# Patient Record
Sex: Female | Born: 1935 | Race: White | Hispanic: No | Smoking: Never smoker
Health system: Southern US, Community
[De-identification: ages and names within clinical notes are randomized; demographics above are authoritative.]

## PROBLEM LIST (undated history)

## (undated) DIAGNOSIS — I639 Cerebral infarction, unspecified: Secondary | ICD-10-CM

## (undated) DIAGNOSIS — M1990 Unspecified osteoarthritis, unspecified site: Secondary | ICD-10-CM

## (undated) DIAGNOSIS — R011 Cardiac murmur, unspecified: Secondary | ICD-10-CM

## (undated) DIAGNOSIS — I499 Cardiac arrhythmia, unspecified: Secondary | ICD-10-CM

## (undated) DIAGNOSIS — R112 Nausea with vomiting, unspecified: Secondary | ICD-10-CM

## (undated) DIAGNOSIS — I10 Essential (primary) hypertension: Secondary | ICD-10-CM

## (undated) DIAGNOSIS — R0600 Dyspnea, unspecified: Secondary | ICD-10-CM

## (undated) DIAGNOSIS — I2510 Atherosclerotic heart disease of native coronary artery without angina pectoris: Secondary | ICD-10-CM

## (undated) DIAGNOSIS — E7800 Pure hypercholesterolemia, unspecified: Secondary | ICD-10-CM

## (undated) DIAGNOSIS — Z9889 Other specified postprocedural states: Secondary | ICD-10-CM

## (undated) DIAGNOSIS — F32A Depression, unspecified: Secondary | ICD-10-CM

## (undated) DIAGNOSIS — F329 Major depressive disorder, single episode, unspecified: Secondary | ICD-10-CM

## (undated) DIAGNOSIS — R5383 Other fatigue: Secondary | ICD-10-CM

## (undated) DIAGNOSIS — M519 Unspecified thoracic, thoracolumbar and lumbosacral intervertebral disc disorder: Secondary | ICD-10-CM

## (undated) DIAGNOSIS — T4145XA Adverse effect of unspecified anesthetic, initial encounter: Secondary | ICD-10-CM

## (undated) DIAGNOSIS — M316 Other giant cell arteritis: Secondary | ICD-10-CM

## (undated) DIAGNOSIS — Z90710 Acquired absence of both cervix and uterus: Secondary | ICD-10-CM

## (undated) DIAGNOSIS — M549 Dorsalgia, unspecified: Secondary | ICD-10-CM

## (undated) DIAGNOSIS — I4891 Unspecified atrial fibrillation: Secondary | ICD-10-CM

## (undated) DIAGNOSIS — I771 Stricture of artery: Secondary | ICD-10-CM

## (undated) DIAGNOSIS — G459 Transient cerebral ischemic attack, unspecified: Secondary | ICD-10-CM

## (undated) DIAGNOSIS — T8859XA Other complications of anesthesia, initial encounter: Secondary | ICD-10-CM

## (undated) DIAGNOSIS — C801 Malignant (primary) neoplasm, unspecified: Secondary | ICD-10-CM

## (undated) HISTORY — PX: CARDIAC VALVE REPLACEMENT: SHX585

## (undated) HISTORY — DX: Essential (primary) hypertension: I10

## (undated) HISTORY — DX: Dyspnea, unspecified: R06.00

## (undated) HISTORY — DX: Other fatigue: R53.83

## (undated) HISTORY — PX: ABDOMINAL HYSTERECTOMY: SHX81

## (undated) HISTORY — DX: Stricture of artery: I77.1

## (undated) HISTORY — DX: Other giant cell arteritis: M31.6

## (undated) HISTORY — DX: Dorsalgia, unspecified: M54.9

## (undated) HISTORY — DX: Pure hypercholesterolemia, unspecified: E78.00

## (undated) HISTORY — PX: MOHS SURGERY: SHX181

## (undated) HISTORY — DX: Unspecified atrial fibrillation: I48.91

## (undated) HISTORY — PX: APPENDECTOMY: SHX54

## (undated) HISTORY — DX: Acquired absence of both cervix and uterus: Z90.710

## (undated) HISTORY — PX: CORONARY ANGIOPLASTY: SHX604

## (undated) HISTORY — DX: Transient cerebral ischemic attack, unspecified: G45.9

## (undated) HISTORY — DX: Cerebral infarction, unspecified: I63.9

---

## 1999-04-04 ENCOUNTER — Encounter: Admission: RE | Admit: 1999-04-04 | Discharge: 1999-04-04 | Payer: Self-pay | Admitting: Internal Medicine

## 1999-04-04 ENCOUNTER — Encounter: Payer: Self-pay | Admitting: Internal Medicine

## 1999-04-16 ENCOUNTER — Encounter: Admission: RE | Admit: 1999-04-16 | Discharge: 1999-04-16 | Payer: Self-pay | Admitting: Internal Medicine

## 1999-04-16 ENCOUNTER — Encounter: Payer: Self-pay | Admitting: Internal Medicine

## 2000-04-16 ENCOUNTER — Encounter: Payer: Self-pay | Admitting: Internal Medicine

## 2000-04-16 ENCOUNTER — Encounter: Admission: RE | Admit: 2000-04-16 | Discharge: 2000-04-16 | Payer: Self-pay | Admitting: Internal Medicine

## 2001-04-28 ENCOUNTER — Encounter: Payer: Self-pay | Admitting: Internal Medicine

## 2001-04-28 ENCOUNTER — Encounter: Admission: RE | Admit: 2001-04-28 | Discharge: 2001-04-28 | Payer: Self-pay | Admitting: Internal Medicine

## 2002-05-03 ENCOUNTER — Encounter: Admission: RE | Admit: 2002-05-03 | Discharge: 2002-05-03 | Payer: Self-pay | Admitting: Internal Medicine

## 2002-05-03 ENCOUNTER — Encounter: Payer: Self-pay | Admitting: Internal Medicine

## 2003-05-10 ENCOUNTER — Encounter: Admission: RE | Admit: 2003-05-10 | Discharge: 2003-05-10 | Payer: Self-pay | Admitting: Internal Medicine

## 2003-09-09 HISTORY — PX: PROLAPSED UTERINE FIBROID LIGATION: SHX5400

## 2004-05-09 HISTORY — PX: KNEE SURGERY: SHX244

## 2004-07-24 ENCOUNTER — Encounter: Admission: RE | Admit: 2004-07-24 | Discharge: 2004-07-24 | Payer: Self-pay | Admitting: Internal Medicine

## 2005-07-30 ENCOUNTER — Encounter: Admission: RE | Admit: 2005-07-30 | Discharge: 2005-07-30 | Payer: Self-pay | Admitting: Internal Medicine

## 2005-08-02 ENCOUNTER — Encounter: Admission: RE | Admit: 2005-08-02 | Discharge: 2005-08-02 | Payer: Self-pay | Admitting: Internal Medicine

## 2006-05-28 ENCOUNTER — Ambulatory Visit: Payer: Self-pay | Admitting: Cardiovascular Disease

## 2006-08-12 ENCOUNTER — Encounter: Admission: RE | Admit: 2006-08-12 | Discharge: 2006-08-12 | Payer: Self-pay | Admitting: Internal Medicine

## 2007-05-13 ENCOUNTER — Ambulatory Visit: Payer: Self-pay | Admitting: Vascular Surgery

## 2007-05-27 ENCOUNTER — Ambulatory Visit: Payer: Self-pay | Admitting: Vascular Surgery

## 2007-05-27 ENCOUNTER — Encounter: Admission: RE | Admit: 2007-05-27 | Discharge: 2007-05-27 | Payer: Self-pay | Admitting: Vascular Surgery

## 2007-07-01 ENCOUNTER — Ambulatory Visit: Payer: Self-pay | Admitting: Vascular Surgery

## 2007-07-10 ENCOUNTER — Ambulatory Visit (HOSPITAL_COMMUNITY): Admission: RE | Admit: 2007-07-10 | Discharge: 2007-07-10 | Payer: Self-pay | Admitting: Vascular Surgery

## 2007-07-10 ENCOUNTER — Ambulatory Visit: Payer: Self-pay | Admitting: Vascular Surgery

## 2007-08-19 ENCOUNTER — Ambulatory Visit: Payer: Self-pay | Admitting: Vascular Surgery

## 2007-08-19 ENCOUNTER — Encounter: Admission: RE | Admit: 2007-08-19 | Discharge: 2007-08-19 | Payer: Self-pay | Admitting: Internal Medicine

## 2008-01-26 ENCOUNTER — Ambulatory Visit: Payer: Self-pay | Admitting: Cardiovascular Disease

## 2008-02-08 ENCOUNTER — Ambulatory Visit: Payer: Self-pay

## 2008-02-24 ENCOUNTER — Ambulatory Visit (HOSPITAL_COMMUNITY): Admission: RE | Admit: 2008-02-24 | Discharge: 2008-02-24 | Payer: Self-pay | Admitting: Cardiovascular Disease

## 2008-05-25 DIAGNOSIS — E7800 Pure hypercholesterolemia, unspecified: Secondary | ICD-10-CM | POA: Insufficient documentation

## 2008-05-25 DIAGNOSIS — I6350 Cerebral infarction due to unspecified occlusion or stenosis of unspecified cerebral artery: Secondary | ICD-10-CM | POA: Insufficient documentation

## 2008-05-25 DIAGNOSIS — G459 Transient cerebral ischemic attack, unspecified: Secondary | ICD-10-CM | POA: Insufficient documentation

## 2008-05-25 DIAGNOSIS — I4891 Unspecified atrial fibrillation: Secondary | ICD-10-CM

## 2008-05-25 DIAGNOSIS — M316 Other giant cell arteritis: Secondary | ICD-10-CM

## 2008-05-25 DIAGNOSIS — R0602 Shortness of breath: Secondary | ICD-10-CM

## 2008-05-25 DIAGNOSIS — R5383 Other fatigue: Secondary | ICD-10-CM

## 2008-05-25 DIAGNOSIS — R002 Palpitations: Secondary | ICD-10-CM | POA: Insufficient documentation

## 2008-05-25 DIAGNOSIS — I10 Essential (primary) hypertension: Secondary | ICD-10-CM | POA: Insufficient documentation

## 2008-05-25 DIAGNOSIS — R5381 Other malaise: Secondary | ICD-10-CM | POA: Insufficient documentation

## 2008-05-26 ENCOUNTER — Ambulatory Visit: Payer: Self-pay | Admitting: Cardiovascular Disease

## 2008-05-26 ENCOUNTER — Encounter: Payer: Self-pay | Admitting: Cardiovascular Disease

## 2008-08-19 ENCOUNTER — Encounter: Admission: RE | Admit: 2008-08-19 | Discharge: 2008-08-19 | Payer: Self-pay | Admitting: *Deleted

## 2008-11-23 ENCOUNTER — Ambulatory Visit: Payer: Self-pay | Admitting: Cardiovascular Disease

## 2008-11-23 DIAGNOSIS — I776 Arteritis, unspecified: Secondary | ICD-10-CM

## 2008-11-25 LAB — CONVERTED CEMR LAB
CO2: 29 meq/L (ref 19–32)
Chloride: 107 meq/L (ref 96–112)
GFR calc non Af Amer: 64.43 mL/min (ref >60)
Glucose, Bld: 119 mg/dL — ABNORMAL HIGH (ref 70–99)

## 2008-11-30 ENCOUNTER — Ambulatory Visit: Payer: Self-pay | Admitting: Internal Medicine

## 2008-12-12 ENCOUNTER — Telehealth: Payer: Self-pay | Admitting: Cardiovascular Disease

## 2008-12-27 ENCOUNTER — Telehealth: Payer: Self-pay | Admitting: Cardiovascular Disease

## 2008-12-30 ENCOUNTER — Telehealth (INDEPENDENT_AMBULATORY_CARE_PROVIDER_SITE_OTHER): Payer: Self-pay | Admitting: *Deleted

## 2009-01-27 ENCOUNTER — Telehealth (INDEPENDENT_AMBULATORY_CARE_PROVIDER_SITE_OTHER): Payer: Self-pay | Admitting: *Deleted

## 2009-04-05 ENCOUNTER — Encounter (INDEPENDENT_AMBULATORY_CARE_PROVIDER_SITE_OTHER): Payer: Self-pay | Admitting: *Deleted

## 2009-05-17 ENCOUNTER — Ambulatory Visit: Payer: Self-pay | Admitting: Cardiovascular Disease

## 2009-05-17 DIAGNOSIS — R011 Cardiac murmur, unspecified: Secondary | ICD-10-CM | POA: Insufficient documentation

## 2009-06-06 ENCOUNTER — Encounter: Payer: Self-pay | Admitting: Cardiovascular Disease

## 2009-06-06 ENCOUNTER — Ambulatory Visit: Payer: Self-pay

## 2009-06-06 ENCOUNTER — Ambulatory Visit: Payer: Self-pay | Admitting: Cardiology

## 2009-06-06 ENCOUNTER — Ambulatory Visit (HOSPITAL_COMMUNITY): Admission: RE | Admit: 2009-06-06 | Discharge: 2009-06-06 | Payer: Self-pay | Admitting: Cardiovascular Disease

## 2009-08-21 ENCOUNTER — Encounter: Admission: RE | Admit: 2009-08-21 | Discharge: 2009-08-21 | Payer: Self-pay | Admitting: Internal Medicine

## 2010-02-21 ENCOUNTER — Telehealth (INDEPENDENT_AMBULATORY_CARE_PROVIDER_SITE_OTHER): Payer: Self-pay | Admitting: *Deleted

## 2010-04-01 ENCOUNTER — Encounter: Payer: Self-pay | Admitting: Internal Medicine

## 2010-04-10 ENCOUNTER — Ambulatory Visit
Admission: RE | Admit: 2010-04-10 | Discharge: 2010-04-10 | Payer: Self-pay | Source: Home / Self Care | Attending: Cardiovascular Disease | Admitting: Cardiovascular Disease

## 2010-04-10 NOTE — Letter (Signed)
Summary: Appointment - Reminder 2  [employer], Main Office  [address]   [place], [place] [address]   Phone: [phone]  Fax: [phone]     April 05, 2009 MRN: [id number]   [name] [name] [address] [place], [place]  [address]   Dear Ms. SHULTS,  Our records indicate that it is time to schedule a follow-up appointment with Dr. Eden Emms. It is very important that we reach you to schedule this appointment. We look forward to participating in your health care needs. Please contact us at the number listed above at your earliest convenience to schedule your appointment.  If you are unable to make an appointment at this time, give [place] a call so we can update our records.  Sincerely,   [facility] Scheduling Team

## 2010-04-10 NOTE — Assessment & Plan Note (Signed)
Summary: 6 month rov/sl   CC:  check up.  History of Present Illness: [name] [name] is seen today for F/U of bruits, vasculitis, with subclavian stenosis on the right, elevated chol. , HTN and PAF.  She is on chronic coumdin which is followed by Dr. Mikey Bussing.  Her levels have been up and down due to fruit consumption.  She sees Dr's in [place] for her giant cell arteritis and is now on methotrexate and low dose prednisone.  Dr. Darrick Penna has turned her down for surgery.  She has chronic rigth arm weakness.  She has had bruits in the neck but no carotid stenosis by duplex most recently 2009.  She has not had any symptoms of TIA, SSCP palpitations, or syncope. I reviewed her CTA from 9/10.  I think it is stable.  She clearly has subclavian stenosis on the right with stable symptoms but has 4 vessel run-off to the head without critical carotid stenosis.    Current Problems (verified): 1)  Murmur  (ICD-785.2) 2)  Vasculitis  (ICD-447.6) 3)  Atrial Fibrillation  (ICD-427.31) 4)  Tia  (ICD-435.9) 5)  Cva  (ICD-434.91) 6)  Hypertension  (ICD-401.9) 7)  Hypercholesterolemia  (ICD-272.0) 8)  Fatigue  (ICD-780.79) 9)  Dyspnea  (ICD-786.05) 10)  Palpitations  (ICD-785.1) 11)  Giant Cell Arteritis  (ICD-446.5)  Current Medications (verified): 1)  Simvastatin 40 Mg Tabs (Simvastatin) .... 1/2 Tab By Mouth Once Daily 2)  Warfarin Sodium 5 Mg Tabs (Warfarin Sodium) .... As Directed By Clinic 3)  Zolpidem Tartrate 10 Mg Tabs (Zolpidem Tartrate) .Marland Kitchen.. 1 Daily 4)  Lopressor 100 Mg Tabs (Metoprolol Tartrate) .Marland Kitchen.. 1 Tab By Mouth Bid 5)  Methotrexate 2.5 Mg Tabs (Methotrexate Sodium) .... 4  Tabs By Mouth Weekly 6)  Enalapril Maleate 20 Mg Tabs (Enalapril Maleate) .... Take One Tablet By Mouth Twice A Day 7)  Vitamin D (Ergocalciferol) 50000 Unit Caps (Ergocalciferol) .... Weekly 8)  Gabapentin 300 Mg Caps (Gabapentin) .Marland Kitchen.. 1 Tab By Mouth Two Times A Day 9)  Hydrocodone-Acetaminophen 5-500 Mg Tabs  (Hydrocodone-Acetaminophen) .... As Needed  Allergies (verified): No Known Drug Allergies  Past History:  Past Medical History: Last updated: 11/23/2008 Current Problems:  ATRIAL FIBRILLATION (ICD-427.31) TIA (ICD-435.9) CVA (ICD-434.91) HYPERTENSION (ICD-401.9) HYPERCHOLESTEROLEMIA (ICD-272.0)secondary to vascular disease. FATIGUE (ICD-780.79) DYSPNEA (ICD-786.05) PALPITATIONS (ICD-785.1) GIANT CELL ARTERITIS (ICD-446.5)  Recent urinary tract infection.  chronic back pain  Past Surgical History: Last updated: 05/25/2008  uterine prolapse surgery in July 2005 left knee surgery in March 2006 hysterectomy appendectomy  Family History: Last updated: 05/25/2008 noncontributory.  Social History: Last updated: 05/25/2008  She is happily married.  Her husband was with her today.  She looks   younger than her stated age. Tobacco Use - No.  Alcohol Use - no  Review of Systems       Denies fever, malais, weight loss, blurry vision, decreased visual acuity, cough, sputum, SOB, hemoptysis, pleuritic pain, palpitaitons, heartburn, abdominal pain, melena, lower extremity edema, claudication, or rash.   Vital Signs:  Patient profile:   77 year old female Height:      64 inches Weight:      142 pounds BMI:     24.46 Pulse rate:   66 / minute Resp:     12 per minute BP sitting:   115 / 70  (left arm)  Vitals Entered By: Kem Parkinson (May 17, 2009 3:12 PM)  Physical Exam  General:  Affect appropriate Healthy:  appears stated age HEENT: normal Neck supple  with no adenopathy JVP normal bilateral  bruits no thyromegaly Lungs clear with no wheezing and good diaphragmatic motion Heart:  S1/S2 systolic murmur, no rub, gallop or click PMI normal Abdomen: benighn, BS positve, no tenderness, no AAA no bruit.  No HSM or HJR Distal pulses intact with no bruits No edema Neuro non-focal Skin warm and dry Unable to palpate right brachial/radial or ulner Bilateral  carotid and sublavian bruity.  Should always take BP in left arm   Impression & Recommendations:  Problem # 1:  VASCULITIS (ICD-447.6) Burnt out giant cell arteritis with residual severe stenosis of right sublavian. 4 vessel run off to head with no carotid stenosis.  R arm symptoms moderate but stable.  Previously seen by surgeon and refused intervention  Problem # 2:  HYPERTENSION (ICD-401.9) Well controlled continue low soidum diet The following medications were removed from the medication list:    Hydrochlorothiazide 12.5 Mg Tabs (Hydrochlorothiazide) .Marland Kitchen... Take one tablet by mouth daily. Her updated medication list for this problem includes:    Lopressor 100 Mg Tabs (Metoprolol tartrate) .Marland Kitchen... 1 tab by mouth bid    Enalapril Maleate 20 Mg Tabs (Enalapril maleate) .Marland Kitchen... Take one tablet by mouth twice a day  Problem # 3:  MURMUR (ICD-785.2) Likely aortic sclerosis.  F/U echo The following medications were removed from the medication list:    Hydrochlorothiazide 12.5 Mg Tabs (Hydrochlorothiazide) .Marland Kitchen... Take one tablet by mouth daily. Her updated medication list for this problem includes:    Lopressor 100 Mg Tabs (Metoprolol tartrate) .Marland Kitchen... 1 tab by mouth bid    Enalapril Maleate 20 Mg Tabs (Enalapril maleate) .Marland Kitchen... Take one tablet by mouth twice a day  Orders: Echocardiogram (Echo)  Problem # 4:  CVA (ICD-434.91) Distant CVA with no carotid disease by duplex 2009 or CTA 2010  F/U INR [place] Her updated medication list for this problem includes:    Warfarin Sodium 5 Mg Tabs (Warfarin sodium) .Marland Kitchen... As directed by clinic  Patient Instructions: 1)  Your physician recommends that you schedule a follow-up appointment in: ONE YEAR 2)  Your physician has requested that you have an echocardiogram.  Echocardiography is a painless test that uses sound waves to create images of your heart. It provides your doctor with information about the size and shape of your heart and how well your  heart's chambers and valves are working.  This procedure takes approximately one hour. There are no restrictions for this procedure.  Prevention & Chronic Care Immunizations   Influenza vaccine: Not documented    Tetanus booster: Not documented    Pneumococcal vaccine: Not documented    H. zoster vaccine: Not documented  Colorectal Screening   Hemoccult: Not documented    Colonoscopy: Not documented  Other Screening   Pap smear: Not documented    Mammogram: Not documented    DXA bone density scan: Not documented   Smoking status: never  (05/25/2008)  Lipids   Total Cholesterol: Not documented   LDL: Not documented   LDL Direct: Not documented   HDL: Not documented   Triglycerides: Not documented    SGOT (AST): Not documented   SGPT (ALT): Not documented   Alkaline phosphatase: Not documented   Total bilirubin: Not documented  Hypertension   Last Blood Pressure: 115 / 70  (05/17/2009)   Serum creatinine: 0.9  (11/23/2008)   Serum potassium 4.1  (11/23/2008)  Self-Management Support :    Hypertension self-management support: Not documented    Lipid self-management support: Not documented  EKG Report  Procedure date:  05/17/2009  Findings:      NSR 66 Normal ECG

## 2010-04-12 NOTE — Progress Notes (Signed)
  EMSI request received sent to [facility] w/ chart Kimberly Mesiemore  February 21, 2010 1:59 PM   

## 2010-04-18 NOTE — Assessment & Plan Note (Signed)
Summary: F1Y/DM   CC:  pain in arms..pt states another MD told her he thinks its muscle pain.  History of Present Illness: [name] [name] is seen today for F/U of bruits, vasculitis, with subclavian stenosis on the right, elevated chol. , HTN and PAF.  She is on chronic coumdin which is followed by Dr. Mikey Bussing.  Her levels have been up and down due to fruit consumption.  She sees Dr's in [place] for her giant cell arteritis and is now off methotrexate and low dose prednisone.  Dr. Darrick Penna has turned her down for surgery.  She has chronic rigth arm weakness.  She has had bruits in the neck but no carotid stenosis by duplex most recently 2009.  She has not had any symptoms of TIA, SSCP palpitations, or syncope. I reviewed her CTA from 9/10.  I think it is stable.  She clearly has subclavian stenosis on the right with stable symptoms but has 4 vessel run-off to the head   She has had muscular arm pain that is worse at rest.  Seems to coincide with restarting statin.  Will have her take a holiday from it and see if pain improves.  Had lab work at [facility] yesterday will get to review.  Current Problems (verified): 1)  Murmur  (ICD-785.2) 2)  Vasculitis  (ICD-447.6) 3)  Atrial Fibrillation  (ICD-427.31) 4)  Tia  (ICD-435.9) 5)  Cva  (ICD-434.91) 6)  Hypertension  (ICD-401.9) 7)  Hypercholesterolemia  (ICD-272.0) 8)  Fatigue  (ICD-780.79) 9)  Dyspnea  (ICD-786.05) 10)  Palpitations  (ICD-785.1) 11)  Giant Cell Arteritis  (ICD-446.5)  Current Medications (verified): 1)  Warfarin Sodium 5 Mg Tabs (Warfarin Sodium) .... As Directed By Clinic 2)  Zolpidem Tartrate 10 Mg Tabs (Zolpidem Tartrate) .Marland Kitchen.. 1 Daily 3)  Lopressor 100 Mg Tabs (Metoprolol Tartrate) .Marland Kitchen.. 1 Tab By Mouth Bid 4)  Enalapril Maleate 20 Mg Tabs (Enalapril Maleate) .... Take One Tablet By Mouth Twice A Day 5)  Meloxicam 7.5 Mg Tabs (Meloxicam) .... As Needed  Allergies (verified): No Known Drug Allergies  Past History:  Past Medical  History: Last updated: 11/23/2008 Current Problems:  ATRIAL FIBRILLATION (ICD-427.31) TIA (ICD-435.9) CVA (ICD-434.91) HYPERTENSION (ICD-401.9) HYPERCHOLESTEROLEMIA (ICD-272.0)secondary to vascular disease. FATIGUE (ICD-780.79) DYSPNEA (ICD-786.05) PALPITATIONS (ICD-785.1) GIANT CELL ARTERITIS (ICD-446.5)  Recent urinary tract infection.  chronic back pain  Past Surgical History: Last updated: 05/25/2008  uterine prolapse surgery in July 2005 left knee surgery in March 2006 hysterectomy appendectomy  Family History: Last updated: 05/25/2008 noncontributory.  Social History: Last updated: 05/25/2008  She is happily married.  Her husband was with her today.  She looks   younger than her stated age. Tobacco Use - No.  Alcohol Use - no  Review of Systems       Denies fever, malais, weight loss, blurry vision, decreased visual acuity, cough, sputum, SOB, hemoptysis, pleuritic pain, palpitaitons, heartburn, abdominal pain, melena, lower extremity edema, claudication, or rash.   Vital Signs:  Patient profile:   77 year old female Height:      64 inches Weight:      142 pounds BMI:     24.46 Pulse rate:   68 / minute Resp:     14 per minute BP sitting:   118 / 68  (left arm) BP standing:   80 / 55  (right arm)  Vitals Entered By: Kem Parkinson (April 10, 2010 1:42 PM)  Physical Exam  General:  Affect appropriate Healthy:  appears stated age HEENT: normal Neck  supple with no adenopathy JVP normal bilateral carotid and right subclavian bruits no thyromegaly Lungs clear with no wheezing and good diaphragmatic motion Heart:  S1/S2 no murmur,rub, gallop or click PMI normal Abdomen: benighn, BS positve, no tenderness, no AAA no bruit.  No HSM or HJR Distal pulses intact with no bruits No edema Neuro non-focal Skin warm and dry    Impression & Recommendations:  Problem # 1:  HYPERTENSION (ICD-401.9) Well controlled.  Should take BP in left arm Her  updated medication list for this problem includes:    Lopressor 100 Mg Tabs (Metoprolol tartrate) .Marland Kitchen... 1 tab by mouth bid    Enalapril Maleate 20 Mg Tabs (Enalapril maleate) .Marland Kitchen... Take one tablet by mouth twice a day  Problem # 2:  HYPERCHOLESTEROLEMIA (ICD-272.0) Check labs from [place].  Holiday from statin and see if arm pain improves The following medications were removed from the medication list:    Simvastatin 40 Mg Tabs (Simvastatin) .Marland Kitchen... 1/2 tab by mouth once daily  Problem # 3:  GIANT CELL ARTERITIS (ICD-446.5) Residual vascular disease primarily manifest with right subclavian stenosis.  F/U UE and carotid duplex.  No indication for surgery.  Other Orders: Carotid Duplex (Carotid Duplex) Arterial Duplex Upper Extremity (Arterial Duplex Up )  Patient Instructions: 1)  Your physician wants you to follow-up in:6 MONTHS   You will receive a reminder letter in the mail two months in advance. If you don't receive a letter, please call our office to schedule the follow-up appointment. 2)  Your physician has requested that you have a carotid duplex. This test is an ultrasound of the carotid arteries in your neck. It looks at blood flow through these arteries that supply the brain with blood. Allow one hour for this exam. There are no restrictions or special instructions. 3)  Your physician has requested that you have a  upper extremity arterial duplex.  This test is an ultrasound of the arteries in the legs or arms.  It looks at arterial blood flow in the legs and arms.  Allow one hour for Lower and Upper Arterial scans. There are no restrictions or special instructions.HX SUBCLAVIN STEAL

## 2010-04-26 ENCOUNTER — Other Ambulatory Visit (INDEPENDENT_AMBULATORY_CARE_PROVIDER_SITE_OTHER): Payer: Medicare Other

## 2010-04-26 ENCOUNTER — Encounter (INDEPENDENT_AMBULATORY_CARE_PROVIDER_SITE_OTHER): Payer: Medicare Other

## 2010-04-26 ENCOUNTER — Encounter: Payer: Self-pay | Admitting: Cardiovascular Disease

## 2010-04-26 DIAGNOSIS — I6529 Occlusion and stenosis of unspecified carotid artery: Secondary | ICD-10-CM

## 2010-04-26 DIAGNOSIS — I70219 Atherosclerosis of native arteries of extremities with intermittent claudication, unspecified extremity: Secondary | ICD-10-CM

## 2010-05-21 ENCOUNTER — Emergency Department (HOSPITAL_COMMUNITY)
Admission: EM | Admit: 2010-05-21 | Discharge: 2010-05-22 | Disposition: A | Discharge status: Home / Self Care | Payer: Medicare Other | Attending: Emergency Medicine | Admitting: Emergency Medicine

## 2010-05-21 DIAGNOSIS — R5381 Other malaise: Secondary | ICD-10-CM | POA: Insufficient documentation

## 2010-05-21 DIAGNOSIS — E7800 Pure hypercholesterolemia, unspecified: Secondary | ICD-10-CM | POA: Insufficient documentation

## 2010-05-21 DIAGNOSIS — Z8679 Personal history of other diseases of the circulatory system: Secondary | ICD-10-CM | POA: Insufficient documentation

## 2010-05-21 DIAGNOSIS — I10 Essential (primary) hypertension: Secondary | ICD-10-CM | POA: Insufficient documentation

## 2010-05-21 DIAGNOSIS — I4891 Unspecified atrial fibrillation: Secondary | ICD-10-CM | POA: Insufficient documentation

## 2010-05-21 DIAGNOSIS — M79609 Pain in unspecified limb: Secondary | ICD-10-CM | POA: Insufficient documentation

## 2010-05-22 LAB — CBC
HCT: 41.3 % (ref 36.0–46.0)
MCHC: 32.7 g/dL (ref 30.0–36.0)
Platelets: 205 10*3/uL (ref 150–400)
RBC: 5.42 MIL/uL — ABNORMAL HIGH (ref 3.87–5.11)
RDW: 14.8 % (ref 11.5–15.5)
WBC: 11.7 10*3/uL — ABNORMAL HIGH (ref 4.0–10.5)

## 2010-05-22 LAB — DIFFERENTIAL
Basophils Relative: 0 % (ref 0–1)
Eosinophils Absolute: 0.1 10*3/uL (ref 0.0–0.7)
Lymphocytes Relative: 17 % (ref 12–46)
Monocytes Relative: 7 % (ref 3–12)
Neutro Abs: 8.8 10*3/uL — ABNORMAL HIGH (ref 1.7–7.7)
Neutrophils Relative %: 75 % (ref 43–77)

## 2010-05-22 LAB — BASIC METABOLIC PANEL
BUN: 13 mg/dL (ref 6–23)
Chloride: 103 mEq/L (ref 96–112)
Creatinine, Ser: 0.77 mg/dL (ref 0.4–1.2)
GFR calc Af Amer: >60 mL/min (ref >60)
GFR calc non Af Amer: >60 mL/min (ref >60)
Glucose, Bld: 104 mg/dL — ABNORMAL HIGH (ref 70–99)
Potassium: 4.1 mEq/L (ref 3.5–5.1)
Sodium: 138 mEq/L (ref 135–145)

## 2010-05-22 LAB — POCT CARDIAC MARKERS
CKMB, poc: 2.4 ng/mL (ref 1.0–8.0)
Troponin i, poc: <0.05 ng/mL (ref 0.00–0.09)

## 2010-05-22 LAB — PROTIME-INR: Prothrombin Time: 28.2 seconds — ABNORMAL HIGH (ref 11.6–15.2)

## 2010-05-30 ENCOUNTER — Telehealth: Payer: Self-pay | Admitting: Cardiovascular Disease

## 2010-05-30 NOTE — Telephone Encounter (Signed)
Returned patient's call-states she had been having weak spells so she went to her PCP Dr. Mikey Bussing in [facility] yesterday.  In atrial fibrillation per patient.  To see PCP again next week.  Currently on Coumadin for a. Fib already.  States she will see Dr. Eden Emms if needs to after next Wed. Appt. With PCP.

## 2010-05-30 NOTE — Telephone Encounter (Signed)
Pt states she been weak. Pt states she went to her primary doctor and the doctor said she was in a fib. Pt want to know what she needs to do.

## 2010-06-28 ENCOUNTER — Encounter: Payer: Self-pay | Admitting: Physician Assistant

## 2010-06-29 ENCOUNTER — Encounter: Payer: Self-pay | Admitting: Physician Assistant

## 2010-06-29 ENCOUNTER — Ambulatory Visit (INDEPENDENT_AMBULATORY_CARE_PROVIDER_SITE_OTHER): Payer: Medicare Other | Admitting: Physician Assistant

## 2010-06-29 VITALS — BP 118/78 | HR 102 | Ht 61.0 in | Wt 137.0 lb

## 2010-06-29 DIAGNOSIS — I4891 Unspecified atrial fibrillation: Secondary | ICD-10-CM

## 2010-06-29 DIAGNOSIS — R079 Chest pain, unspecified: Secondary | ICD-10-CM | POA: Insufficient documentation

## 2010-06-29 MED ORDER — DIGOXIN 125 MCG PO TABS: 125.0000 ug | ORAL_TABLET | Freq: Every day | ORAL | Status: DC

## 2010-06-29 NOTE — Assessment & Plan Note (Signed)
Her symptoms are somewhat atypical.  It may be related to atrial fibrillation.  She will undergo Lexiscan Myoview study.

## 2010-06-29 NOTE — Assessment & Plan Note (Addendum)
She is back in atrial fibrillation.  She remains on Coumadin.  She is somewhat symptomatic with this.  Her heart rate is uncontrolled.  She did not tolerate the higher dose of Toprol.  I do not believe that she would tolerate the lowest dose of Cardizem either given her blood pressure and her response to the Toprol.  Therefore, I decided to place her on digoxin 0.125 mg a day.  She's had normal renal function in the past.  I will set her up for follow up with Dr. Eden Emms.  We will obtain her prior INRs from her PCP.  She has had some chest discomfort.  I will arrange a stress test as well.  She may be a candidate for antiarrhythmic therapy if we cannot get her back into rhythm on her own.  We can certainly consider proceeding with cardioversion if she does not convert back to sinus rhythm on her own.    07/02/10: Received notification from her PCPs office regarding recent INRs as follows:  04/09/10...3.9  05/10/10...3.2  06/20/10....5.5  06/26/10....3.2

## 2010-06-29 NOTE — Progress Notes (Signed)
History of Present Illness: Primary Cardiologist:  Dr. Charlton Haws  [name] [name] is a 77 y.o. female with a h/o parox. AFib and right subclavian stenosis.  She is referred back by her PCP.  She has had recurrent atrial flutter fibrillation over the last 4 weeks.  By her description, she is having her axes normal atrial fibrillation.  She sometimes feels her heart rate is elevated.  She sometimes feels short of breath.  She does have a chest fullness.  This seems to come on with exertion.  She denies any radiation or associated nausea or diaphoresis.  She denies syncope.  She denies orthopnea or PND.  Denies pedal edema.  Her PCP recently increased her Toprol to 100 mg 3 times a day.  She was unable tolerate this and the dose was changed back to twice a day.  Her Coumadin is managed by her PCP.  It sounds as though it has been therapeutic over the last several weeks.  Past Medical History  Diagnosis Date  . Atrial fibrillation     coumadin per PCP;  echo 06/06/09: EF 60%, mild LVH, Gr 1 diast dysfx, AV gradient mean 9, mod MR, mod LAE;  Myoview 11/09: normal, EF 81%  . TIA (transient ischemic attack)   . CVA (cerebral vascular accident)   . HTN (hypertension)   . Hypercholesteremia   . Fatigue   . Dyspnea   . Palpitations   . Giant cell arteritis     sees dr. at [facility]  . UTI (urinary tract infection)   . Back pain   . H/O: hysterectomy   . Subclavian artery stenosis, right     dopplers done 2/12;  carotids 2/12: 0-39% bilateral    Current Outpatient Prescriptions  Medication Sig Dispense Refill  . ALPRAZolam (XANAX) 0.5 MG tablet Take 0.5 mg by mouth. 1/2 to 1 tablet as needed for anxiety       . enalapril (VASOTEC) 20 MG tablet Take 20 mg by mouth 2 (two) times daily.       Marland Kitchen HYDROcodone-acetaminophen (VICODIN) 5-500 MG per tablet Take 1 tablet by mouth every 6 (six) hours as needed.        . meloxicam (MOBIC) 7.5 MG tablet Take 7.5 mg by mouth daily.        . metoprolol (TOPROL-XL)  200 MG 24 hr tablet Take 100 mg by mouth 2 (two) times daily.        . rosuvastatin (CRESTOR) 10 MG tablet Take 5 mg by mouth daily.        Marland Kitchen warfarin (COUMADIN) 5 MG tablet Take 5 mg by mouth as directed.        . zolpidem (AMBIEN) 10 MG tablet Take 10 mg by mouth at bedtime as needed.        Marland Kitchen DISCONTD: metoprolol (LOPRESSOR) 100 MG tablet Take 100 mg by mouth 2 (two) times daily.          No Known Allergies  ROS:  See history of present illness.  She denies fevers, chills, cough, melena, hematochezia.  Other systems reviewed and negative.  Of note, she did go the emergency room recently with recurrent arm pain.  She had Dopplers performed in February 2012 that demonstrated continued stenosis on the right brachial consistent with steal.  Vital Signs: BP 118/78  Pulse 102  Ht 5\' 1"  (1.549 m)  Wt 137 lb (62.143 kg)  BMI 25.89 kg/m2  PHYSICAL EXAM: Well nourished, well developed, in no acute distress  HEENT: normal Neck: no JVD Endocrine: no thyromegaly Cardiac:  normal S1, S2; irreg. Irreg. Rhythm, no murmur Lungs:  clear to auscultation bilaterally, no wheezing, rhonchi or rales Abd: soft, nontender, no hepatomegaly Ext: no edema Skin: warm and dry Neuro:  CNs 2-12 intact, no focal abnormalities noted Psych: normal affect Repeat BP by me in left arm: 110/60  EKG:  Atrial fibrillation, heart rate 102, normal axis, nonspecific ST-T wave changes  ASSESSMENT AND PLAN:

## 2010-06-29 NOTE — Patient Instructions (Signed)
Your physician recommends that you schedule a follow-up appointment in: 2 WEEKS WITH DR. Eden Emms AS PER SCOTT WEAVER, PA-C.  Your physician has requested that you have a lexiscan Myoview 786.50 WITHIN THE NEXT 1-2 WEEKS BEFORE SEEING NISHAN. For further information please visit [email or web address] Please follow instruction sheet, as given.  Your physician has recommended you make the following change in your medication: DIGOXIN 0.125 MG 1 TABLET DAILY

## 2010-07-10 ENCOUNTER — Encounter: Payer: Self-pay | Admitting: Cardiovascular Disease

## 2010-07-10 ENCOUNTER — Ambulatory Visit (HOSPITAL_COMMUNITY): Payer: Medicare Other | Attending: Cardiovascular Disease | Admitting: Radiology

## 2010-07-10 ENCOUNTER — Encounter: Payer: Self-pay | Admitting: *Deleted

## 2010-07-10 ENCOUNTER — Ambulatory Visit: Payer: Medicare Other | Admitting: Cardiovascular Disease

## 2010-07-10 ENCOUNTER — Ambulatory Visit (INDEPENDENT_AMBULATORY_CARE_PROVIDER_SITE_OTHER): Payer: Medicare Other | Admitting: Cardiovascular Disease

## 2010-07-10 VITALS — BP 96/62 | HR 81 | Ht 63.0 in | Wt 133.0 lb

## 2010-07-10 DIAGNOSIS — I4891 Unspecified atrial fibrillation: Secondary | ICD-10-CM

## 2010-07-10 DIAGNOSIS — I10 Essential (primary) hypertension: Secondary | ICD-10-CM

## 2010-07-10 DIAGNOSIS — R9431 Abnormal electrocardiogram [ECG] [EKG]: Secondary | ICD-10-CM

## 2010-07-10 DIAGNOSIS — I776 Arteritis, unspecified: Secondary | ICD-10-CM

## 2010-07-10 DIAGNOSIS — Z01810 Encounter for preprocedural cardiovascular examination: Secondary | ICD-10-CM

## 2010-07-10 DIAGNOSIS — R079 Chest pain, unspecified: Secondary | ICD-10-CM

## 2010-07-10 DIAGNOSIS — R0602 Shortness of breath: Secondary | ICD-10-CM

## 2010-07-10 DIAGNOSIS — Z7901 Long term (current) use of anticoagulants: Secondary | ICD-10-CM

## 2010-07-10 DIAGNOSIS — E7800 Pure hypercholesterolemia, unspecified: Secondary | ICD-10-CM

## 2010-07-10 MED ORDER — REGADENOSON 0.4 MG/5ML IV SOLN
0.4000 mg | Freq: Once | INTRAVENOUS | Status: AC
  Administered 2010-07-10: 0.4 mg via INTRAVENOUS

## 2010-07-10 MED ORDER — TECHNETIUM TC 99M TETROFOSMIN IV KIT
10.6000 | PACK | Freq: Once | INTRAVENOUS | Status: AC | PRN
  Administered 2010-07-10: 11 via INTRAVENOUS

## 2010-07-10 MED ORDER — TECHNETIUM TC 99M TETROFOSMIN IV KIT
33.0000 | PACK | Freq: Once | INTRAVENOUS | Status: AC | PRN
  Administered 2010-07-10: 33 via INTRAVENOUS

## 2010-07-10 NOTE — Progress Notes (Signed)
[facility] SITE 3 NUCLEAR MED [address] [place] [address] [phone]  Cardiology Nuclear Med Study  [name] [name] is a 77 y.o. female [id number] [birth year]   Nuclear Med Background Indication for Stress Test:  Evaluation for Ischemia and recent OV for Atrial fibrillation History: 03/11 Echo 60%, '05 Heart Catheterization No CAD, 11/09 Myocardial Perfusion Study EF 81% NL and AFIB/AFLUTTER Cardiac Risk Factors: Carotid Disease, CVA, Hypertension, Lipids, PVD and TIA  Symptoms:  Chest Pain, DOE, Fatigue, Fatigue with Exertion and Palpitations   Nuclear Pre-Procedure Caffeine/Decaff Intake:  None NPO After: 7:30am   Lungs:  clear IV 0.9% NS with Angio Cath:  20g  IV Site: R Hand  IV Started by:  Cathlyn Parsons, RN  Chest Size (in):  36 Cup Size: B  Height: 5\' 3"  (1.6 m)  Weight:  133 lb (60.328 kg)  BMI:  Body mass index is 23.56 kg/(m^2). Tech Comments: Metoprolol held x  4 hrs.    Nuclear Med Study 1 or 2 day study: 1 day  Stress Test Type:  Eugenie Birks  Reading MD: Arvilla Meres, MD  Order Authorizing Provider: P.Nishan  Resting Radionuclide: Technetium 18m Tetrofosmin  Resting Radionuclide Dose: 10.6 mCi   Stress Radionuclide:  Technetium 24m Tetrofosmin  Stress Radionuclide Dose: 33 mCi           Stress Protocol Rest HR: 66 Rest HR: 81  Rest BP: 112/74 Stress BP: 132/62  Exercise Time (min): n/a METS: n/a   Predicted Max HR: 141 bpm % Max HR: 57.45 bpm Rate Pressure Product: 21308   Dose of Adenosine (mg):  n/a Dose of Lexiscan: 0.4 mg  Dose of Atropine (mg): n/a Dose of Dobutamine: n/a mcg/kg/min (at max HR)  Stress Test Technologist: Milana Na, EMT-P  Nuclear Technologist:  Domenic Polite, CNMT     Rest Procedure:  Myocardial perfusion imaging was performed at rest 45 minutes following the intravenous administration of Technetium 42m Tetrofosmin. Rest ECG: AFIB/AFLUTTER  Stress Procedure:  The patient received IV  Lexiscan 0.4 mg over 15-seconds.  Technetium 45m Tetrofosmin injected at 30-seconds.  There were no significant changes with Lexiscan.  Quantitative spect images were obtained after a 45 minute delay. Stress ECG: No significant change from baseline ECG  QPS Raw Data Images:  Normal; no motion artifact; normal heart/lung ratio. Stress Images:  Normal homogeneous uptake in all areas of the myocardium. Rest Images:  Normal homogeneous uptake in all areas of the myocardium. Subtraction (SDS):  Normal Transient Ischemic Dilatation (Normal <1.22):  1.07 Lung/Heart Ratio (Normal <0.45):  0.32  Quantitative Gated Spect Images QGS EDV:  45 ml QGS ESV:  13 ml QGS cine images:  NL LV Function; NL Wall Motion QGS EF: 72%  Impression Exercise Capacity:  Lexiscan with no exercise. BP Response:  n/a Clinical Symptoms:  n/a ECG Impression:  Atrial fib with rest and stress. No significant ST abnormalities with stress. Comparison with Prior Nuclear Study: No significant change from previous study  Overall Impression:  Normal stress nuclear study.    Angel Hobdy

## 2010-07-10 NOTE — Patient Instructions (Addendum)
Your physician recommends that you schedule a follow-up appointment in: WILL SCHEDULE HOSP F/U FROM HOSP  Your physician recommends that you return for lab work in: TODAY BMET CBC PT V72.81  Your physician has recommended that you have a Cardioversion (DCCV). Electrical Cardioversion uses a jolt of electricity to your heart either through paddles or wired patches attached to your chest. This is a controlled, usually prescheduled, procedure. Defibrillation is done under light anesthesia in the hospital, and you usually go home the day of the procedure. This is done to get your heart back into a normal rhythm. You are not awake for the procedure. Please see the instruction sheet given to you today. 07/12/10   Your physician recommends that you continue on your current medications as directed. Please refer to the Current Medication list given to you today.

## 2010-07-10 NOTE — Assessment & Plan Note (Signed)
Cholesterol is at goal.  Continue current dose of statin and diet Rx.  No myalgias or side effects.  F/U  LFT's in 6 months. No results found for this basename: LDLCALC             

## 2010-07-10 NOTE — Assessment & Plan Note (Signed)
Rx INR Risks and benefits of [facility] discussed including stroke  [facility] set for this Thursday

## 2010-07-10 NOTE — Progress Notes (Signed)
[name] [name] is seen today for F/U of bruits, vasculitis, with subclavian stenosis on the right, elevated chol. , HTN and PAF.  She is on chronic coumdin which is followed by Dr. Mikey Bussing.  Her levels have been therapeutic   She sees Dr's in [place] for her giant cell arteritis and is now on methotrexate and low dose prednisone.  Dr. Darrick Penna has turned her down for surgery.  She has chronic rigth arm weakness.  She has had bruits in the neck but no carotid stenosis by duplex most recently 2009.  She has not had any symptoms of TIA, SSCP palpitations, or syncope. I reviewed her CTA from 9/10.  I think it is stable.  She clearly has subclavian stenosis on the right with stable symptoms but has 4 vessel run-off to the head without critical carotid stenosis.    She has been in recurrent afib/flutter for the last few weeks.  Myouve reviewed from today and no ischemia with normal EF.  Discussed [facility] with patient and husband.  Risk of stroke discussed.  Will arrange [facility] this week.  Clearly more fatigue and dyspnea while in afib.  If not successful will refer to EPS as she has mostly flutter waves and may be an ablation candidate  ROS: Denies fever, malais, weight loss, blurry vision, decreased visual acuity, cough, sputum, SOB, hemoptysis, pleuritic pain, palpitaitons, heartburn, abdominal pain, melena, lower extremity edema, claudication, or rash.   General: Affect appropriate Healthy:  appears stated age HEENT: normal Neck supple with no adenopathy JVP normal bialeral  bruits no thyromegaly Lungs clear with no wheezing and good diaphragmatic motion Heart:  S1/S2 nSEM murmur,rub, gallop or click PMI normal Abdomen: benighn, BS positve, no tenderness, no AAA no bruit.  No HSM or HJR Distal pulses intact with no bruits No edema Neuro non-focal Skin warm and dry No muscular weakness BP lower in right arm   Current Outpatient Prescriptions  Medication Sig Dispense Refill  . ALPRAZolam (XANAX) 0.5 MG tablet  Take 0.5 mg by mouth. 1/2 to 1 tablet as needed for anxiety       . digoxin (LANOXIN) 0.125 MG tablet Take 1 tablet (125 mcg total) by mouth daily.  30 tablet  11  . enalapril (VASOTEC) 20 MG tablet Take 20 mg by mouth 2 (two) times daily.       Marland Kitchen HYDROcodone-acetaminophen (VICODIN) 5-500 MG per tablet Take 1 tablet by mouth every 6 (six) hours as needed.        . metoprolol (TOPROL-XL) 200 MG 24 hr tablet Take 100 mg by mouth 2 (two) times daily.        . nortriptyline (PAMELOR) 25 MG capsule Take 25 mg by mouth at bedtime.        Marland Kitchen warfarin (COUMADIN) 5 MG tablet Take 5 mg by mouth as directed.        . zolpidem (AMBIEN) 10 MG tablet Take 10 mg by mouth at bedtime as needed.        . meloxicam (MOBIC) 7.5 MG tablet Take 7.5 mg by mouth daily.        . rosuvastatin (CRESTOR) 10 MG tablet Take 5 mg by mouth daily.         No current facility-administered medications for this visit.   Facility-Administered Medications Ordered in Other Visits  Medication Dose Route Frequency Provider Last Rate Last Dose  . regadenoson (LEXISCAN) injection SOLN 0.4 mg  0.4 mg Intravenous Once Dolores Patty, MD   0.4 mg at 07/10/10  1444  . technetium tetrofosmin (TC-MYOVIEW) injection 11 milli Curie  11 milli Curie Intravenous Once PRN Dolores Patty, MD   11 milli Curie at 07/10/10 1300  . technetium tetrofosmin (TC-MYOVIEW) injection 33 milli Curie  33 milli Curie Intravenous Once PRN Dolores Patty, MD   33 milli Curie at 07/10/10 1445    Allergies  Review of patient's allergies indicates no known allergies.  Electrocardiogram:  Assessment and Plan

## 2010-07-10 NOTE — Assessment & Plan Note (Signed)
Well controlled.  Continue current medications and low sodium Dash type diet.    

## 2010-07-10 NOTE — Assessment & Plan Note (Signed)
F/U upper extremity ABI';s and carotids in 6 months.  Right arm symptoms stable

## 2010-07-10 NOTE — Assessment & Plan Note (Signed)
Reviewed myovue from today.  Lexiscan normal EF 72%  Despite arteritis no evidence of mid vessel CAD.

## 2010-07-11 LAB — PROTIME-INR
INR: 4.3 ratio — ABNORMAL HIGH (ref 0.8–1.0)
Prothrombin Time: 41.8 s — ABNORMAL HIGH (ref 10.2–12.4)

## 2010-07-11 LAB — CBC WITH DIFFERENTIAL/PLATELET
Basophils Absolute: 0 10*3/uL (ref 0.0–0.1)
Eosinophils Absolute: 0 10*3/uL (ref 0.0–0.7)
HCT: 43 % (ref 36.0–46.0)
Hemoglobin: 14 g/dL (ref 12.0–15.0)
Lymphs Abs: 1.5 10*3/uL (ref 0.7–4.0)
MCHC: 32.5 g/dL (ref 30.0–36.0)
Monocytes Absolute: 0.7 10*3/uL (ref 0.1–1.0)
Neutro Abs: 5.9 10*3/uL (ref 1.4–7.7)
RDW: 15.4 % — ABNORMAL HIGH (ref 11.5–14.6)

## 2010-07-11 LAB — BASIC METABOLIC PANEL
Calcium: 9.4 mg/dL (ref 8.4–10.5)
Creatinine, Ser: 0.9 mg/dL (ref 0.4–1.2)

## 2010-07-11 NOTE — Progress Notes (Signed)
COPY ROUTED TO DR. NISHAN.[name] [name] ° °

## 2010-07-12 ENCOUNTER — Ambulatory Visit (HOSPITAL_COMMUNITY)
Admission: RE | Admit: 2010-07-12 | Discharge: 2010-07-12 | Disposition: A | Discharge status: Home / Self Care | Payer: Medicare Other | Source: Ambulatory Visit | Attending: Cardiovascular Disease | Admitting: Cardiovascular Disease

## 2010-07-12 DIAGNOSIS — I4891 Unspecified atrial fibrillation: Secondary | ICD-10-CM

## 2010-07-12 DIAGNOSIS — Z7901 Long term (current) use of anticoagulants: Secondary | ICD-10-CM | POA: Insufficient documentation

## 2010-07-12 LAB — PROTIME-INR: INR: 3.25 — ABNORMAL HIGH (ref 0.00–1.49)

## 2010-07-13 NOTE — Consult Note (Signed)
  [name], [name] [name]               ACCOUNT NO.:  [id number]  MEDICAL RECORD NO.:  [id number]           PATIENT TYPE:  LOCATION:                                 FACILITY:  PHYSICIAN:  Peter C. Eden Emms, MD, FACCDATE OF BIRTH:  09-Dec-1929  DATE OF CONSULTATION: DATE OF DISCHARGE:                                CONSULTATION   CARDIOVERSION  This is a 77 year old patient with recurrent atrial fibrillation on therapeutic Coumadin.  INR 3.2 at the time of cardioversion.  The patient was anesthetized with 75 of propofol and 40 mg a lidocaine by Dr. Autumn Patty.  A single 150-joule synchronized biphasic shock was delivered.  She converted from atrial fibrillation at a rate of 119 to normal sinus rhythm at a rate of 72.  IMPRESSION:  Successful cardioversion with no immediate neurological sequela.     Noralyn Pick. Eden Emms, MD, [facility]     PCN/MEDQ  D:  07/12/2010  T:  07/13/2010  Job:  [id number]  Electronically Signed by Charlton Haws MD [facility] on 07/13/2010 04:57:52 PM

## 2010-07-24 NOTE — Op Note (Signed)
NAMESHANNON, [name] [name]               ACCOUNT NO.:  [id number]   MEDICAL RECORD NO.:  [id number]          PATIENT TYPE:  AMB   LOCATION:  SDS                          FACILITY:  [facility]   PHYSICIAN:  [name], MD  DATE OF BIRTH:  Jan 07, 1930   DATE OF PROCEDURE:  07/10/2007  DATE OF DISCHARGE:                               OPERATIVE REPORT   PROCEDURE:  Arch aortogram with selective catheterization of innominate  right common carotid and left subclavian arteries.   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   POSTOPERATIVE DIAGNOSES:  Bilateral subclavian artery occlusive disease  with exertional fatigue, right arm.   ANESTHESIA:  Local.   OPERATIVE DETAILS:  After obtaining informed consent, the patient was  taken to the PV lab.  The patient was placed in supine position on the  angio table.  Both groins were prepped and draped in the usual sterile  fashion.  Local anesthesia was infiltrated over the right common femoral  artery.  A Majestic needle was used to cannulate the right common  femoral artery and a 0.035 Wholey wire advanced into the abdominal aorta  under fluoroscopic guidance.  Next, a 5-French sheath was placed over  the guidewire in the right common femoral artery.  A 5-French pigtail  catheter was then placed over the guidewire and these were advanced as a  unit up into the aortic arch.  An arch aortogram was then obtained.  This shows a patent innominate left common carotid and left subclavian  artery origin.  The right subclavian artery is diffusely narrowed  distally.  The vertebral artery is patent on the right side.  The left  subclavian artery is also diffusely diseased distal to the vertebral.  However, the left vertebral artery is also patent.  The left common  carotid and right common carotid arteries are patent.  The left internal  carotid artery is widely patent at the bifurcation.  The  left external  carotid artery is diffusely diseased.  Next, the pigtail catheter was  pulled back over a guidewire and an H1 catheter was placed over the  guidewire in the innominate artery on the right side, selectively  catheterized, and then the wire was advanced up into the right common  carotid, and the H1 catheter selectively catheterized into this artery  as well.  Right common carotid artery injection was then obtained.  AP  and lateral views of the intracerebral vasculature were also obtained,  to be interpreted later by the neuroradiologist.  Right common carotid  artery is widely patent.  The right internal carotid artery is widely  patent.  The right external carotid artery is diminutive, but patent,  although narrowed at its proximal portion.  There is approximately 25%  stenosis of the right internal carotid artery origin, best seen on the  slightly oblique view.   Next, the H1 catheter was pulled back down into the innominate artery.  A selective injection of the innominate artery was performed.  This  shows again a patent vertebral artery on the right side.  There is a  diffusely diseased right subclavian artery with abundant collaterals  developing from this.  The right subclavian becomes a string sign  distally as well as the axillary artery.  Runoff views were performed  down the arm.  There are abundant collaterals around the shoulder.  The  brachial artery reconstitutes at the proximal third of the humerus.  The  brachial artery is patent throughout its course.  The radial, ulnar, and  interosseous arteries are also patent at least all the way to the level  of the wrist, but the contrast is diminutive and poorly opacified  distally.   Next, the H1 catheter was pulled back into the aortic arch.  This was  then used to selectively catheterize the left subclavian artery.  Selective injection of the left subclavian artery shows again the left  vertebral artery is  patent.  This is smaller than the right, but with  antegrade flow.  The left internal mammary artery is patent.  The left  subclavian artery is diffusely diseased.  However, this is patent  throughout the left shoulder area and into the left arm.  The diffuse  narrowing again is primarily in the distal left subclavian artery and  out into the left axillary artery.  The left brachial artery is patent  throughout its course.  The left radial, ulnar, and interosseous  arteries are all patent.  These were all viewed ar all the level of the  wrist where the contrast becomes much more poorly opacified due to the  diffuse narrowing in the left subclavian artery.   Next, H1 catheter was pulled back down into the aortic arch and over a  guidewire and removed.  A 5-French sheath was left in place to be pulled  on the holding area.  The patient tolerated the procedure well with no  other complications.   OPERATIVE FINDINGS:  1. Diffusely diseased with string sign narrowing of right subclavian      and axillary artery.  2. Diffusely diseased distal left subclavian artery with near string      sign of distal left subclavian and left axillary artery.  3. A 25% stenosis, right internal carotid artery.  4. Normal aortic arch configuration with patent right and left      subclavian innominate right and left carotid origins.      [name], MD  Electronically Signed     CEF/MEDQ  D:  07/10/2007  T:  07/11/2007  Job:  [id number]

## 2010-07-24 NOTE — Assessment & Plan Note (Signed)
HEALTHCARE                            CARDIOLOGY OFFICE NOTE   NAME:[name] [name], [name] [name]                      MRN:          [id number]  DATE:01/26/2008                            DOB:          [birth year]    Ms. [name] [name] is referred by Dr. Lindwood Qua for atrial fibrillation,  possible cardioversion.   Ms. [name] [name] is a complicated patient.  I have seen her back in March  2008.   The patient has had paroxysmal atrial fibrillation as far back as 1994.  She has had a TIA or CVA.  She has been on chronic Coumadin since April  2006.   Her previous echoes have shown good LV function with some mitral annular  calcification.  She had a cath back in 2005 which showed no significant  coronary artery disease.   She is essentially on lifelong Coumadin.   There is a question of positive anti-cardiolipin antibody.   The patient since has been diagnosed with giant cell arteritis, she has  significant vascular disease in her upper extremities.  She has been  seen by Dr. Darrick Penna and had an arteriogram.  Apparently a biopsy was not  done.  Subsequently, Dr. Darrick Penna referred the patient to [facility] as they  apparently have more experience with possible brachial to carotid  bypasses.   In regards to the patient's heart, she has had some increased  palpitations, dyspnea, and fatigue.  She was noted to be in atrial  fibrillation about 5 weeks ago.   I reviewed her protimes from the last few weeks.  Unfortunately, her INR  on December 11, 2007, was 1.1.  Her INR on January 22, 2008, was 1.8 and  she was 2.2 on January 25, 2008.   Apparently, there has been a recent UTI and she is now on Cipro and she  will be getting her protime checked weekly at Dr. Neita Garnet office.   I had a long discussion with the patient regarding her options.  I am a  little bit leery about the fact that I do not have total control over  her Coumadin.  I do think that she probably deserves  at least one  attempt at cardioversion.  I would initially try this without  antiarrhythmic.  I would feel more comfortable doing this with a TEE to  rule out clot as opposed to trying to wait for another 3-4 weeks to make  sure her INRs are successively therapeutic.   The risks of stroke and complications including esophageal perforation  were discussed with the patient.  She and her husband understand and are  willing to proceed.   I also would like to rule out coronary artery disease since the patient  has bad vascular disease in the upper extremities and chest.   I am assuming that Dr. Darrick Penna and the vascular doctors of [facility] have  worked her up further in regards to any abdominal aneurysms or renal  artery stenosis.   Her blood pressure is hard to take since she has significant pulse  deficits in the upper extremities.   She has not  had any recurrent TIAs or CVAs since 2006.  Her past medical  history otherwise remarkable for hypertension, hypercholesterolemia,  recent UTI, paroxysmal atrial fibrillation, giant cell arteritis,  previous uterine prolapse surgery in July 2005, left knee surgery in  March 2006, chronic back pain, previous hysterectomy and appendectomy,  shingles in 2003, and history of panic attacks.   She denies any allergies.   She is happily married.  Her husband was with her today.  She looks  younger than her stated age.  She does not smoke or drink.   Family history is noncontributory.   Her meds include:  1. Hydrochlorothiazide 12.5 a day.  2. Warfarin as directed.  3. Zolpidem 10 a day.  4. Prednisone 5 a day.  5. Metoprolol 100 b.i.d.  6. Enalapril 20 b.i.d.  7. Simvastatin 40 a day.  8. Cipro 500 b.i.d.   PHYSICAL EXAMINATION:  GENERAL:  [name] in no distress.  VITAL SIGNS:  Her weight is 132.  We did not take her blood pressure in  the upper extremities, pulse is 88 and irregular, respiratory rate 14,  and afebrile.   HEENT:  Palpable carotids.  I cannot hear any bruits.  She does have a  bruit under the right clavicle region.  Carotids appear to be equal in  intensity.  LUNGS:  Clear with good diaphragmatic motion.  No wheezing.  CARDIAC:  There is an S1 and S2 with a systolic ejection murmur.  PMI  normal.  ABDOMEN:  Benign.  There is no bruits.  No AAA.  No hepatosplenomegaly  or hepatojugular reflux or tenderness.  EXTREMITIES:  Distal pulses are palpable.  There does not appear to be a  vascular disease in the lower extremities.  In the upper extremity, she  has very faint pulses in both brachials and both radials.   Her baseline electrocardiogram is normal.  Her EKG from Dr. Lane Hacker  office shows atrial fibrillation with nonspecific ST-T wave changes.   IMPRESSION:  1. Paroxysmal atrial fibrillation, recurrent, rate control fine, needs      further monitoring of her Coumadin.  Transesophageal cardioversion      to be scheduled in the next 2-3 weeks.  2. Hypertension, currently well controlled.  We will consider doing a      renal duplex and screen her aorta after her cardioversion.      Continue current dose of medications.  She seems to be tolerating      enalapril, so I doubt that there is high-grade renal artery      stenosis.  3. Giant cell arteritis.  Follow up with [facility] physicians.  She may      very well end up needing an upper extremity bypass procedure.  She      does have some weakness in her arms particularly on the right side      now.  There is no limb threatening ischemia.  4. Anticoagulation.  Follow up with Dr. Mikey Bussing.  We will check her      INRs weekly.  Her INR needs to be 2.5 at the time of cardioversion,      but we will do a TEE regardless since her protimes have been      labile.  5. Recent urinary tract infection.  Continue Cipro.  I do not know if      they would have a better choice for this in regards to interactions      with her Coumadin.  She will have a  followup urine specimen per Dr.      Mikey Bussing.  6. Hypercholesterolemia secondary to vascular disease.  Continue      simvastatin.  Lipid and liver profile in 6 months.   Again, considerable time was spent with the patient trying to understand  her diagnosis of giant cell arteritis and arranging her TE  cardioversion.  I also personally talked to Dr. Lindwood Qua regarding  her plan.     Noralyn Pick. Eden Emms, MD, [facility]  Electronically Signed    PCN/MedQ  DD: 01/26/2008  DT: 01/27/2008  Job #: [id number]

## 2010-07-24 NOTE — Assessment & Plan Note (Signed)
OFFICE VISIT   [name] [name], [name] [name]  DOB:  [birth year]                                       07/01/2007  CHART#:[id number]   The patient is a 77 year old female initially seen in March 2009 for  right forearm early fatigue ability.  She continues to have the same  symptoms currently.  This has now been going on for approximately 7  months.  She states that initially she had a symptoms in her left  forearm, but these were improved with an aircast and was thought to be  due to tendonitis.  However, the last 5 months, she has developed  progressive fatigue in her right upper extremity.  She also has some  occasional numbness and tingling in her right fingers at night time.  She denies significant family history of vascular disease.  She is a  nonsmoker.  She does not have a history of elevated cholesterol but  recently was started on anti-cholesterol medications by Dr. Kellie Simmering.  She does have a history of hypertension.  She also had a stroke in 2006  after a knee operation, which was thought to be due to atrial  fibrillation.  She has been on Coumadin since 2006 for her atrial  fibrillation.   PAST MEDICAL HISTORY:  Stroke in 2006 which gave her a left hemiplegia  and loss of speech, now completely recovered.   FAMILY HISTORY:  Unremarkable.   SOCIAL HISTORY:  She is married, has 3 children.  She is a nonsmoker,  although there is smoking in the house.  She does not consume alcohol.   MEDICATIONS:  Include enalapril 20 mg twice a day, warfarin 5 mg once a  day, metoprolol 100 mg 1-1/2 tablet once a day, alprazolam 0.5 mg  p.r.n., Darvocet p.r.n., Prilosec p.r.n., hydrochlorothiazide 25 mg  every other day, Simvastatin once daily.   PHYSICAL EXAM:  Blood pressure 146/60 in the left arm, pulse 75 and  regular.  HEENT is unremarkable.  She has 2+ carotid pulses.  There are  no carotid bruits.  Chest:  Clear to auscultation.  Heart exam is  slightly irregular  with no murmur.  Abdomen:  Soft, nontender,  nondistended.  No masses.  No bruits.  Extremities:  She has absent  brachial, radial, axillary, and subclavian pulses in both upper  extremities.  Hands are pink, warm, and adequately perfused.  She has 2+  femoral pulses bilaterally.  She has a right 2+ popliteal pulse.  She  has absent pedal pulses in the right leg.  She has absent popliteal and  pedal pulses in the left leg.  Feet were pink and warm, and adequately  perfused.   I reviewed the office note from Dr. Kellie Simmering, and in his valuation, he  does not feel that this represents active inflammatory arteriopathy.  He  believes this is more likely due to general atherosclerosis.   In light of this, the patient is having fairly severe symptoms and I  believe she would benefit from revascularization of her right subclavian  artery and axillary system.  We will schedule her for an arteriogram on  Jul 10, 2007 to further evaluate her arterial tree for purposes of  planning for angioplasty and stenting, or more likely operative  reconstruction.  Risks, benefits, and possible complications and  procedure details of carotid angiography as well as  arch and subclavian  angiography and angioplasty and stenting were explained to the patient.  She understands and agrees to proceed.  Procedure is scheduled for Jul 10, 2007.  We will stop the Coumadin on July 06, 2007.   [name] Hora. Fields, MD  Electronically Signed   CEF/MEDQ  D:  07/01/2007  T:  07/02/2007  Job:  [id number]   cc:   Noralyn Pick. Eden Emms, MD, [facility]  Wannetta Sender.  Aundra Dubin, M.D.

## 2010-07-24 NOTE — Assessment & Plan Note (Signed)
OFFICE VISIT   [name], [name]  DOB:  1929/06/13                                       08/19/2007  CHART#:[id number]   The patient returns for followup today.  She was last seen in May of  2009.  We have been following her for subclavian and axillary artery  occlusive disease bilaterally.  Her arteriogram showed severe diffuse  disease of her distal subclavian and axillary artery bilaterally.  Anatomically speaking she would be a candidate for possible carotid to  brachial artery bypass.  However, I did discuss with her that the  durability of this may be limited.  At the  time of her arteriogram the  plan was to see if her symptoms would improve without any intervention.  She presents to the office today complaining that she is still basically  able to not do many of her daily activities due to numbness in her hand  as well as early fatigability in her right upper extremity and pain in  the shoulder and right arm.  She has minimal symptoms on the left side.  She has no ulcerations on the hand.  She has had no significant  improvement of her symptoms.  However, her symptoms fortunately have not  become worse.   PHYSICAL EXAMINATION:  On exam today blood pressure is 120/70 taken in  the left leg.  Pulse is 68 and regular.  Hands are pink, warm and  adequately perfused.   At this time she wishes to obtain a second opinion to see if any  operation other than a carotid brachial bypass would help with her  symptoms.  I have referred her to Dr. Wray Kearns, one of the vascular  surgeons at [facility] for further consideration  of this.  She will follow up with me on an as-needed basis if she wishes  to consider having a bypass done here.   [name], MD  Electronically Signed   CEF/MEDQ  D:  08/19/2007  T:  08/20/2007  Job:  [id number]   cc:   Noralyn Pick. Eden Emms, MD, [facility]  Wannetta Sender.  Wray Kearns, Dr

## 2010-07-24 NOTE — Procedures (Signed)
VASCULAR LAB EXAM   INDICATION:  Right arm pain from shoulder to elbow for approximately  four months with any type of motion.   HISTORY:  Diabetes:  No.  Cardiac:  A fib.  Hypertension:  Yes.  Stroke in March, 2006.   EXAM:     1. Upper extremity artery duplex.     2. Right SCA-mid brachial artery scan revealing lumen narrowing from        distal SCA-axillary arteries with dampened flow through the        brachial, radial, and ulnar arteries.     3. A limited left upper extremity duplex revealed again a significant        lumen narrowing from the distal SCA-axillary arteries.     4. Right brachial blood pressure of 80 mmHg, left 78 mmHg.   IMPRESSION:  1. Bilateral distal subclavian artery-axillary arteries with      significant luminal narrowing with marked flow reduction distally.  2. Abnormal brachial pressures.     ___________________________________________  Janetta Hora Fields, MD   PB/MEDQ  D:  05/13/2007  T:  05/13/2007  Job:  [id number]

## 2010-07-24 NOTE — Assessment & Plan Note (Signed)
OFFICE VISIT   [name] [name], [name] [name]  DOB:  December 30, 1929                                       05/13/2007  CHART#:[id number]   [name] is a 77 year old female referred for symptoms of occasional  left forearm and right forearm early fatigability.  She has been  experiencing symptoms for approximately 5 to 6 months.  She states that  initially the symptoms were in her left forearm but these were improved  with an air cast and this was thought to be due to tendinitis.  However,  over the last 4 months she has developed progressive fatigue of her  right upper extremity.  She has also had some occasional numbness and  tingling in her right fingers at nighttime.  She denies significant  family history of vascular disease.  She is a nonsmoker.  She does not  have history of elevated cholesterol.  She does have history of  hypertension.  She also had a stroke in 2006 after having a knee  operation.  She was apparently admitted to [facility] at this time but  details of the workup and evaluation are not available at the time of  consultation today.  She has also previously been seen by Dr. Eden Emms for  what sounds like possible atrial fibrillation or some type of cardiac  arrhythmia.  She was placed on Coumadin in 2006 for this.   She completely recovered from her stroke in 2006.  Her initial symptoms  were essentially left hemiplegia and loss of speech.  She currently  denies any syncopal episodes or any episodes of weakness or numbness in  her upper or lower extremities.  She denies any stroke symptoms.   MEDICATIONS:  1. Enalapril 20 mg b.i.d.  2. Warfarin 5 mg once a day.  3. Metoprolol 100 mg, 1-1/2 tablets once a day.  4. Alprazolam 0.5 mg p.r.n. for panic attacks which have been present      since her stroke.  5. Darvocet p.r.n.  6. Prilosec which was recently started for intermittent episodes of      nausea.  7. Hydrochlorothiazide 25 mg every other  day.   PAST MEDICAL HISTORY:  Is otherwise unremarkable.   FAMILY HISTORY:  Unremarkable.   SOCIAL HISTORY:  1. She is married.  2. Has 3 children.  3. She is a non-smoker.  4. Non-consumer of alcohol.   REVIEW OF SYSTEMS:  She has had recent weight loss and loss of appetite.  However, she denies abdominal pain or postprandial pain.  She does have  a history of atrial fibrillation but denies history of chest pain.  She  has no history of asthma, wheezing, renal dysfunction, GI bleeding,  syncope or headaches.  She has had no decrease in vision or hearing  recently.  She has no history of hypercoagulable state.  She did have a  right ankle fracture in the past.  She has multiple joint arthritis and  muscle pain.  She also has a history of mild depression and anxiety.  She had a stroke, as mentioned above.   PHYSICAL EXAM:  We were unable to obtain blood pressures in either upper  extremity.  Heart rate was 80.  HEENT:  The heart rate was taken in the  carotid and femoral pulse area.  HEENT is unremarkable.  Neck:  Has 2+  carotid pulses without bruit.  Chest:  Clear to auscultation.  Cardiac  Exam:  Slightly irregular with no murmur.  Abdomen:  Soft, nontender,  nondistended, no masses, no bruits.  Extremities:  She had absent  brachial, radial, axillary and subclavian pulses in both upper  extremities.  Hands were pink, warm and adequately perfused.  She had 2+  femoral pulses bilaterally.  She had a right 2+ popliteal pulse.  She  had absent pedal pulses in the right leg.  She had absent popliteal and  pedal pulses in the left leg.  Feet were also pink, warm and adequately  perfused.  She had an upper extremity noninvasive Doppler exam which  showed several narrowings throughout her subclavian and axillary artery  as well as brachial artery on the right side.  She also had evidence of  severe narrowing of her left subclavian artery.  She did have flow into  the radial artery.   There were biphasic wave forms in the right mid and  distal subclavian artery.  High grade stenosis was primarily in the  right axillary artery.  She had monophasic wave forms in the right  radial artery.   In summary, 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  This type of  distribution usually is most consistent with entity such as Takayasu's  or giant cell arteritis.  She does not seem to have very significant  lower extremity occlusive disease symptoms.  Her upper extremity  symptoms, although a nuisance, are fairly nonlimiting currently.   I believe the best option for her would be a CT angiogram of the neck  and chest to further evaluate all of her aortic arch and great vessels.  We will also obtain her records from [facility] for the evaluation that  was done in 2006 regarding her stroke.  I will see her again in 2 weeks  after her CT angiogram has been completed and we have all of the workup  from [facility].  Of note, her blood pressure should be checked in  either the right or left leg as the blood pressures in her upper  extremities are going to be extremely inaccurate.   [name], MD  Electronically Signed   CEF/MEDQ  D:  05/14/2007  T:  05/14/2007  Job:  [id number]   cc:   Wannetta Sender.

## 2010-07-24 NOTE — Assessment & Plan Note (Signed)
OFFICE VISIT   IRIDESSA, HARROW  DOB:  08/26/1929                                       05/27/2007  CHART#:[id number]   Patient returns for followup today.  She had a CT scan of the neck and  chest.  Her symptoms are essentially the same as at her previous office  visit.  Mainly complains of early fatigueability of her right upper  extremity.   On exam today, both hand are pink, warm, and adequately perfused.  They  are both pulseless.  We are unable to obtain blood pressures of the  upper extremities or the lower extremity.  On exam, she does have 2+  femoral pulses bilaterally.   Review of her CT scan shows diffuse occlusive disease of the right  subclavian artery and a chronic total occlusion of the left subclavian  artery.  There is inflammatory reaction around the mid and distal  subclavian artery on the right side.  The proximal subclavian artery is  surrounded with inflammatory change proximally.   Patient's symptoms overall are fairly minimal.  She has no symptoms in  her left arm currently.  She has mild-to-moderate symptoms in the right  upper extremity with early fatigueability but has no frank ischemia.   On review of the x-rays and also discussion with Dr. 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   Review of her records from [facility] was also performed from her  previous stroke admission several years ago.  The stroke was thought to  be due to paroxysmal A fib, and she was placed on Coumadin at that time.  She did not have any evidence on this study, as I was able to review, of  pulselessness in her upper extremities at that time.   I believe the best option for her would be referral to Dr. Kellie Simmering with  rheumatology for further evaluation of possible treatment of this  arteritis.  She will see me in one months' time after evaluation by Dr.  Kellie Simmering to see if  her symptoms and inflammation have improved.   On further note, on further questioning today, she does also describe  multiple aches and pains throughout her body which also may be related  to this.  She will also need a CT scan of the abdomen and pelvis with  lower extremity run-off at some point in the future to make sure she  does not have involvement of the visceral or lower extremity vessels.   [name], MD  Electronically Signed   CEF/MEDQ  D:  05/28/2007  T:  05/28/2007  Job:  [id number]   cc:   Wannetta Sender.  Aundra Dubin, M.D.

## 2010-07-27 NOTE — Assessment & Plan Note (Signed)
OFFICE VISIT   [name] [name], [name] [name]  DOB:  02/27/1930                                       07/13/2007  CHART#:[id number]   The patient underwent her carotid angio today.  This shows a diffusely  diseased distal right subclavian artery with a string sign that extends  all the way to the brachial artery in the mid upper humerus.  She has  very similar findings in her left subclavian system.  The carotid  arteries had minimal disease.  Vertebral arteries were also patent  bilaterally.   The patient states that her symptoms have improved slightly over the  last month.  To reconstruct her on the right side, which is her  symptomatic side, would require a carotid to brachial artery bypass most  likely having to use a greater saphenous vein due to the length of the  bypass.  I believe the best option for her, if her symptoms are  continuing to improve, is continued observation.  She currently is  asymptomatic on the left side.  I do not think this will need to be  addressed.  She does have abundant collaterals around the shoulder on  the right side, so hopefully her symptoms will continue to improve with  time.  She will follow up with me in 1 month to reassess her symptoms.   [name], MD  Electronically Signed   CEF/MEDQ  D:  07/13/2007  T:  07/13/2007  Job:  [id number]   cc:   Noralyn Pick. Eden Emms, MD, [facility]  Wannetta Sender.

## 2010-07-27 NOTE — Assessment & Plan Note (Signed)
[facility]                            CARDIOLOGY OFFICE NOTE   NAME:Bruning, SUNDY HOUCHINS                      MRN:          [id number]  DATE:05/28/2006                            DOB:          [birth year]    Ms. Ahart is a delightful 77 year old patient of Dr. Ortencia [facility].  She is referred here for a second opinion regarding her heart condition.   The patient has a history of paroxysmal atrial fibrillation. This is  long standing. She had an initial episode probably back in 1994, and at  that time, there was a question of a TIA. She had a recurrent TIA or  stroke in April 2006 and was documented to be in atrial fibrillation.   She has had echocardiograms in the past with annular calcification, but  no critical valve disease. She tells me that she has had a  catheterization in the last 3 years which was also normal. At the time  of her initial CVA, she had a cerebral angiogram and had no intracranial  pathology.   The patient is essentially on lifelong Coumadin. Not only has she had  PAF, she has had two previous TIAs. One of them had total left sided  hemiplegia which resolved fortunately.   She also, apparently, has positive anticardiolipin antibodies.   I believe her Coumadin is checked at Dr. Neita Garnet office, but it  appears that it is not a finger prick and that it is sent out [facility].   Fortunately she has not had any recurrent palpitations in the last few  years, nor TIAs or CVAs. She has not had any complications from her  Coumadin.   She has no documented of coronary artery disease or severe valvular  disease. LV function has been normal in the past.   REVIEW OF SYSTEMS:  Remarkable for some abdominal pains. It actually  sounds like that she may have diverticulitis. She should probably have a  follow up colonoscopy, particularly given her Coumadin therapy.   She has not had any previous surgeries. The patient is married.  Unfortunately, her husband is an alcoholic and this has placed a lot of  stress on her over the years. She has three children who are somewhat  supportive.   PAST MEDICAL HISTORY:  Remarkable for hypertension, previous uterine  prolapse surgery in July 2005, left knee surgery in March 2006, chronic  back pain, hysterectomy, many years ago appendectomy when she was in  college, shingles in 2003, question of panic attacks and her two  previous strokes.   ALLERGIES:  The patient denies any allergies.   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 She  seems fairly active at this and again, unfortunately, has to deal with  her alcoholic husband quite a bit.   CURRENT MEDICATIONS:  1. Lopressor 100 mg daily.  2. Enalapril 20 mg daily.  3. Hydrochlorothiazide 12.5 mg daily.  4. Coumadin as directed.   PHYSICAL EXAMINATION:  VITAL SIGNS:  She is in sinus rhythm at a rate of  70. Blood pressure 130/70.  HEENT:  Normal.  CAROTIDS:  Normal without bruits.  LUNGS:  Clear.  HEART:  There is an S1, S2 with a soft systolic murmur.  ABDOMEN:  Benign.  LOWER EXTREMITIES:  Intact pulses, no edema.  NEUROLOGIC:  Non-focal.  SKIN:  Warm and dry.   Her electrocardiogram here in the office is totally normal.   IMPRESSION:  In reviewing the patient's history, she is hypertensive  with PAF. She also has anticardiolipin antibodies and a history of two  previous TIAs. Clearly, she needs to be on lifelong Coumadin. She is  currently not taking an aspirin a day and since there is no evidence of  cerebral vascular or cardiac coronary disease, I think this is  reasonable.   Her blood pressure is being well controlled on beta blockers and  Enalapril.   She also is on a low dose diuretic for this.   She has not had any recurrent palpitations and I suspect she has been  maintaining sinus rhythm. Although I do not have her records, she tells  me that she has had a normal  catheterization and echocardiograms fairly  recently. She is currently very active and asymptomatic. I think for the  time being she is being treated ideally and I would not make any changes  in her current medications. She will follow up with Dr. Lindwood Qua  and we will be able to see this lady at any time should there be any new  cardiac issues to address.   All of the patient's questions were answered at the end of the  interview.     Noralyn Pick. Eden Emms, MD, [facility]  Electronically Signed   PCN/MedQ  DD: 05/28/2006  DT: 05/29/2006  Job #: [id number]   cc:   Wannetta Sender.

## 2010-07-30 ENCOUNTER — Ambulatory Visit (INDEPENDENT_AMBULATORY_CARE_PROVIDER_SITE_OTHER): Payer: Medicare Other | Admitting: Cardiovascular Disease

## 2010-07-30 ENCOUNTER — Encounter: Payer: Self-pay | Admitting: Cardiovascular Disease

## 2010-07-30 DIAGNOSIS — Z79899 Other long term (current) drug therapy: Secondary | ICD-10-CM

## 2010-07-30 DIAGNOSIS — M316 Other giant cell arteritis: Secondary | ICD-10-CM

## 2010-07-30 DIAGNOSIS — I10 Essential (primary) hypertension: Secondary | ICD-10-CM

## 2010-07-30 DIAGNOSIS — E7800 Pure hypercholesterolemia, unspecified: Secondary | ICD-10-CM

## 2010-07-30 DIAGNOSIS — I4891 Unspecified atrial fibrillation: Secondary | ICD-10-CM

## 2010-07-30 MED ORDER — FLECAINIDE ACETATE 50 MG PO TABS: 50.0000 mg | ORAL_TABLET | Freq: Two times a day (BID) | ORAL | Status: DC

## 2010-07-30 NOTE — Patient Instructions (Signed)
Your physician recommends that you schedule a follow-up appointment in: 3 WEEKS WITH DR [facility]  Your physician has recommended you make the following change in your medication:STOP DIGOXIN START FLECAINIDE 50 MG  1 TWICE DAILY  Your physician has requested that you have an exercise tolerance test. For further information please visit [email or web address] Please also follow instruction sheet, as given.   NEXT WEEK WITH SCOTT OR DR Eden Emms  DX Z61.09

## 2010-07-30 NOTE — Assessment & Plan Note (Signed)
Severe right sublavian disease.  Previously seen by Dr Darrick Penna and at [facility]  No limb ischemia.  Take BP in left arm.  F/U vascular if limb ischemic symptoms ensue

## 2010-07-30 NOTE — Assessment & Plan Note (Signed)
Well controlled.  Continue current medications and low sodium Dash type diet.    

## 2010-07-30 NOTE — Assessment & Plan Note (Signed)
Cholesterol is at goal.  Continue current dose of statin and diet Rx.  No myalgias or side effects.  F/U  LFT's in 6 months. No results found for this basename: LDLCALC             

## 2010-07-30 NOTE — Progress Notes (Signed)
[name] [name] is seen today for F/U of bruits, vasculitis, with subclavian stenosis on the right, elevated chol. , HTN and PAF. She is on chronic coumdin which is followed by Dr. Mikey Bussing. Her levels have been therapeutic She sees Dr's in [place] for her giant cell arteritis and is now on methotrexate and low dose prednisone. Dr. Darrick Penna has turned her down for surgery. She has chronic rigth arm weakness. She has had bruits in the neck but no carotid stenosis by duplex most recently 2009. She has not had any symptoms of TIA, SSCP palpitations, or syncope. I reviewed her CTA from 9/10. I think it is stable. She clearly has subclavian stenosis on the right with stable symptoms but has 4 vessel run-off to the head without critical carotid stenosis.  Was in afib . Nonischemic myovue 5/1 with normal EF  [facility] 5/4 with no complications.  However back in afib in office today.  Has been nauseated with stomach problems seen by Dr Mikey Bussing.  Will stop digoxen and continue Toprol for rate control  Discussed starting Flecainide  No CAD septal thickness on echo 2011 only 11mm.  Will start 50 bid and F/U ETT next week with plans on repeat [facility] in 3-4 weeks if she tolerates Flecainide   ROS: Denies fever, malais, weight loss, blurry vision, decreased visual acuity, cough, sputum, SOB, hemoptysis, pleuritic pain, palpitaitons, heartburn, abdominal pain, melena, lower extremity edema, claudication, or rash.   General: Affect appropriate Healthy:  appears stated age HEENT: normal Neck supple with no adenopathy JVP normal no bruits no thyromegaly Lungs clear with no wheezing and good diaphragmatic motion Heart:  S1/S2 no murmur,rub, gallop or click PMI normal Abdomen: benighn, BS positve, no tenderness, no AAA no bruit.  No HSM or HJR Distal  Pulses decreased in right arm No edema Neuro non-focal Skin warm and dry No muscular weakness   Current Outpatient Prescriptions  Medication Sig Dispense Refill  . ALPRAZolam  (XANAX) 0.5 MG tablet 1/2 to 1 tablet as needed for anxiety      . digoxin (LANOXIN) 0.125 MG tablet Take 1 tablet (125 mcg total) by mouth daily.  30 tablet  11  . enalapril (VASOTEC) 20 MG tablet Take 20 mg by mouth 2 (two) times daily.       Marland Kitchen HYDROcodone-acetaminophen (VICODIN) 5-500 MG per tablet Take 1 tablet by mouth every 6 (six) hours as needed.        . meloxicam (MOBIC) 7.5 MG tablet Take 7.5 mg by mouth daily.        . metoprolol (TOPROL-XL) 200 MG 24 hr tablet 1/2 tab po qd      . nortriptyline (PAMELOR) 25 MG capsule Take 25 mg by mouth at bedtime.        . pantoprazole (PROTONIX) 40 MG tablet Take 40 mg by mouth as needed.        . rosuvastatin (CRESTOR) 10 MG tablet Take 10 mg by mouth as needed.        . warfarin (COUMADIN) 5 MG tablet Take 5 mg by mouth as directed.        . zolpidem (AMBIEN) 10 MG tablet Take 10 mg by mouth at bedtime as needed.        Marland Kitchen DISCONTD: rosuvastatin (CRESTOR) 10 MG tablet Take 5 mg by mouth daily.          Allergies  Review of patient's allergies indicates no known allergies.  Electrocardiogram:  Afib 84 QT 345 QRS 82  Assessment and Plan

## 2010-07-30 NOTE — Assessment & Plan Note (Signed)
D/C digoxen with nausea.  Continue anticoagulation and rate control.  Start Felcainide and do ETT next week with eye towards repeart [facility] in 3-4 weeks

## 2010-08-01 ENCOUNTER — Telehealth: Payer: Self-pay | Admitting: Cardiovascular Disease

## 2010-08-01 NOTE — Telephone Encounter (Signed)
Spoke with pt who states since starting Flecainide 50 mg twice daily her BP has been elevated.  Pt states it had been 110 to 148/? And now is 191/100 and 170/90.  Her HR was 79.  Pt states she is taking all medications as ordered.  Pt will continue medications as ordered and I will notify Dr Eden Emms for his review and recommendations.

## 2010-08-01 NOTE — Telephone Encounter (Signed)
Pt's med was changed, bp running "crazy"

## 2010-08-09 ENCOUNTER — Encounter (INDEPENDENT_AMBULATORY_CARE_PROVIDER_SITE_OTHER): Payer: Medicare Other | Admitting: Cardiovascular Disease

## 2010-08-09 ENCOUNTER — Ambulatory Visit (INDEPENDENT_AMBULATORY_CARE_PROVIDER_SITE_OTHER): Payer: Medicare Other | Admitting: Cardiovascular Disease

## 2010-08-09 DIAGNOSIS — I4891 Unspecified atrial fibrillation: Secondary | ICD-10-CM

## 2010-08-09 NOTE — Progress Notes (Signed)
Exercise Treadmill Test  Pre-Exercise Testing Evaluation Rhythm: atrial fibrillation  Rate: 98   QT:  .37 QTc: .48     Test  Exercise Tolerance Test Ordering MD: Charlton Haws, MD  Interpreting MD:  Charlton Haws, MD  Unique Test No: 1  Treadmill:  1  Indication for ETT: A-FIB  Contraindication to ETT: No   Stress Modality: exercise - treadmill  Cardiac Imaging Performed: non   Protocol: modified Bruce - maximal  Max BP:  139/82  Max MPHR (bpm):  141 85% MPR (bpm):  119  MPHR obtained (bpm): 113 % MPHR obtained: 92  Reached 85% MPHR (min:sec):   Total Exercise Time (min-sec):  3:00  Workload in METS:  1.7 Borg Scale: 11  Reason ETT Terminated:  fatigue    ST Segment Analysis At Rest: normal ST segments - no evidence of significant ST depression With Exercise: no evidence of significant ST depression  Other Information Arrhythmia:  No Angina during ETT:  absent (0) Quality of ETT:  diagnostic  ETT Interpretation:  normal - no evidence of ischemia by ST analysis  Comments: No proarrhythmia on flecainide   Recommendations: Continue Flecainide

## 2010-08-13 NOTE — Telephone Encounter (Signed)
PT HAD GXT WITH DR Eden Emms ON 08/09/10

## 2010-08-27 ENCOUNTER — Encounter: Payer: Self-pay | Admitting: Cardiology

## 2010-08-27 ENCOUNTER — Encounter: Payer: Self-pay | Admitting: Cardiovascular Disease

## 2010-08-27 ENCOUNTER — Ambulatory Visit (INDEPENDENT_AMBULATORY_CARE_PROVIDER_SITE_OTHER): Payer: Medicare Other | Admitting: Cardiovascular Disease

## 2010-08-27 DIAGNOSIS — I4891 Unspecified atrial fibrillation: Secondary | ICD-10-CM

## 2010-08-27 DIAGNOSIS — E7800 Pure hypercholesterolemia, unspecified: Secondary | ICD-10-CM

## 2010-08-27 DIAGNOSIS — Z7901 Long term (current) use of anticoagulants: Secondary | ICD-10-CM

## 2010-08-27 DIAGNOSIS — I10 Essential (primary) hypertension: Secondary | ICD-10-CM

## 2010-08-27 NOTE — Assessment & Plan Note (Signed)
Well controlled.  Continue current medications and low sodium Dash type diet.    

## 2010-08-27 NOTE — Assessment & Plan Note (Signed)
Continue Flexainide  Repeat [facility] 7/2

## 2010-08-27 NOTE — Assessment & Plan Note (Signed)
Cholesterol is at goal.  Continue current dose of statin and diet Rx.  No myalgias or side effects.  F/U  LFT's in 6 months. No results found for this basename: LDLCALC             

## 2010-08-27 NOTE — Patient Instructions (Addendum)
Your physician recommends that you schedule a follow-up appointment in: 2-3 weeks after your cardioversion.  You are scheduled for a cardioversion at 11:00 AM on 09/10/10. BE THERE AT 9:00 AM.   HAVE YOUR COUMADIN checked 09/06/10 at Dr. Neita Garnet office.

## 2010-08-27 NOTE — Assessment & Plan Note (Signed)
Continue coumadin.  INR at [facility] next Thursday.  In our office 7/2 am before [facility].  Keep INR above 2.2 to avoid need for TEE

## 2010-08-27 NOTE — Progress Notes (Signed)
[name] [name] is seen today for F/U of bruits, vasculitis, with subclavian stenosis on the right, elevated chol. , HTN and PAF. She is on chronic coumdin which is followed by Dr. Mikey Bussing. Her levels have been therapeutic She sees Dr's in [place] for her giant cell arteritis and is now on methotrexate and low dose prednisone. Dr. Darrick Penna has turned her down for surgery. She has chronic rigth arm weakness. She has had bruits in the neck but no carotid stenosis by duplex most recently 2009. She has not had any symptoms of TIA, SSCP palpitations, or syncope. I reviewed her CTA from 9/10. I think it is stable. She clearly has subclavian stenosis on the right with stable symptoms but has 4 vessel run-off to the head without critical carotid stenosis.  In May was found to be back in afib.  Marland Kitchen Nonischemic myovue 5/1 with normal EF [facility] 5/4 with no complications. However back in afib in office on F/U and Flecainide started.  ETT last week normal with no proarrhythmia  Has been nauseated with stomach problems seen by Dr Mikey Bussing. Will stop digoxen and continue Toprol for rate control  Discussed starting Flecainide No CAD septal thickness on echo 2011 only 11mm.  INR a little high at primary last week.  Will check it next week at [facility].  [facility] 7/2 with PT in our office that morning  Some stomach upset but dont think it is from Flecainide  ROS: Denies fever, malais, weight loss, blurry vision, decreased visual acuity, cough, sputum, SOB, hemoptysis, pleuritic pain, palpitaitons, heartburn, abdominal pain, melena, lower extremity edema, claudication, or rash.  All other systems reviewed and negative  General: Affect appropriate Healthy:  appears stated age HEENT: normal Neck supple with no adenopathy JVP normal no bruits no thyromegaly Lungs clear with no wheezing and good diaphragmatic motion Heart:  S1/S2 no murmur,rub, gallop or click PMI normal Abdomen: benighn, BS positve, no tenderness, no AAA no bruit.  No HSM  or HJR Decreased pulses in left arm No edema Neuro non-focal Skin warm and dry No muscular weakness   Current Outpatient Prescriptions  Medication Sig Dispense Refill  . ALPRAZolam (XANAX) 0.5 MG tablet 1/2 to 1 tablet as needed for anxiety      . enalapril (VASOTEC) 20 MG tablet Take 20 mg by mouth 2 (two) times daily.       . flecainide (TAMBOCOR) 50 MG tablet Take 1 tablet (50 mg total) by mouth 2 (two) times daily.  60 tablet  11  . HYDROcodone-acetaminophen (VICODIN) 5-500 MG per tablet Take 1 tablet by mouth every 6 (six) hours as needed.        . metoprolol (TOPROL-XL) 200 MG 24 hr tablet 1/2 tab po qd      . warfarin (COUMADIN) 5 MG tablet Take 5 mg by mouth as directed.        . zolpidem (AMBIEN) 10 MG tablet Take 10 mg by mouth at bedtime as needed.        Marland Kitchen DISCONTD: meloxicam (MOBIC) 7.5 MG tablet Take 7.5 mg by mouth daily.        Marland Kitchen DISCONTD: nortriptyline (PAMELOR) 25 MG capsule Take 25 mg by mouth at bedtime.        Marland Kitchen DISCONTD: pantoprazole (PROTONIX) 40 MG tablet Take 40 mg by mouth as needed.        Marland Kitchen DISCONTD: rosuvastatin (CRESTOR) 10 MG tablet Take 10 mg by mouth as needed.          Allergies  Review of patient's allergies indicates no known allergies.  Electrocardiogram:  Assessment and Plan

## 2010-09-10 ENCOUNTER — Ambulatory Visit (HOSPITAL_COMMUNITY)
Admission: RE | Admit: 2010-09-10 | Discharge: 2010-09-10 | Disposition: A | Discharge status: Home / Self Care | Payer: Medicare Other | Source: Ambulatory Visit | Attending: Cardiovascular Disease | Admitting: Cardiovascular Disease

## 2010-09-10 DIAGNOSIS — E7800 Pure hypercholesterolemia, unspecified: Secondary | ICD-10-CM | POA: Insufficient documentation

## 2010-09-10 DIAGNOSIS — M316 Other giant cell arteritis: Secondary | ICD-10-CM | POA: Insufficient documentation

## 2010-09-10 DIAGNOSIS — I771 Stricture of artery: Secondary | ICD-10-CM | POA: Insufficient documentation

## 2010-09-10 DIAGNOSIS — I10 Essential (primary) hypertension: Secondary | ICD-10-CM | POA: Insufficient documentation

## 2010-09-10 DIAGNOSIS — I4891 Unspecified atrial fibrillation: Secondary | ICD-10-CM

## 2010-09-10 DIAGNOSIS — Z7901 Long term (current) use of anticoagulants: Secondary | ICD-10-CM | POA: Insufficient documentation

## 2010-09-10 DIAGNOSIS — IMO0002 Reserved for concepts with insufficient information to code with codable children: Secondary | ICD-10-CM | POA: Insufficient documentation

## 2010-09-10 LAB — CBC
MCH: 25 pg — ABNORMAL LOW (ref 26.0–34.0)
MCHC: 33.4 g/dL (ref 30.0–36.0)
Platelets: 234 10*3/uL (ref 150–400)
RBC: 5.72 MIL/uL — ABNORMAL HIGH (ref 3.87–5.11)

## 2010-09-10 LAB — BASIC METABOLIC PANEL
CO2: 26 mEq/L (ref 19–32)
Calcium: 9.4 mg/dL (ref 8.4–10.5)
Creatinine, Ser: 0.87 mg/dL (ref 0.50–1.10)
GFR calc non Af Amer: >60 mL/min (ref >60)
Sodium: 140 mEq/L (ref 135–145)

## 2010-09-10 LAB — PROTIME-INR: Prothrombin Time: 32.6 seconds — ABNORMAL HIGH (ref 11.6–15.2)

## 2010-09-11 ENCOUNTER — Telehealth: Payer: Self-pay | Admitting: Cardiovascular Disease

## 2010-09-11 NOTE — Telephone Encounter (Signed)
Pt had cardioversion on yesterday, has question regarding medication .

## 2010-09-11 NOTE — Telephone Encounter (Signed)
Spoke with pt, she will restart her meds as she was taking prior to the cardioversion [name] [name]

## 2010-09-13 ENCOUNTER — Other Ambulatory Visit: Payer: Self-pay | Admitting: Internal Medicine

## 2010-09-13 DIAGNOSIS — Z1231 Encounter for screening mammogram for malignant neoplasm of breast: Secondary | ICD-10-CM

## 2010-09-20 ENCOUNTER — Encounter: Payer: Self-pay | Admitting: Cardiovascular Disease

## 2010-09-20 ENCOUNTER — Telehealth: Payer: Self-pay | Admitting: *Deleted

## 2010-09-20 NOTE — Telephone Encounter (Signed)
Refer to EPS

## 2010-09-20 NOTE — Telephone Encounter (Signed)
error 

## 2010-09-20 NOTE — Telephone Encounter (Signed)
Spoke with pt, we had received an EKG from dr hoffman's office showing the pt back in atrial fib and feeling bad. She reports being out of atrial fib for about 3 days and then going back in a. Fib. She had an episode of feeling weak, shaky and having a full feeling in her chest. Her heart rate is running 109 to 90 at rest. She does feel some better today. She has a follow up appt 10-03-10 and wants to keep that appt. Will forward to dr Verdis Prime

## 2010-09-21 NOTE — Telephone Encounter (Signed)
Spoke with pt, she will see dr Johney Frame 10-01-10 @ 8:30am [name] [name]

## 2010-09-26 ENCOUNTER — Telehealth: Payer: Self-pay | Admitting: Cardiovascular Disease

## 2010-09-26 NOTE — Telephone Encounter (Signed)
Spoke with pt, she will cont the flecainide until seen by dr allred on Monday [name] [name]

## 2010-09-26 NOTE — Telephone Encounter (Signed)
Per patient calling, no information was given.

## 2010-09-27 ENCOUNTER — Encounter: Payer: Self-pay | Admitting: Internal Medicine

## 2010-10-01 ENCOUNTER — Encounter: Payer: Self-pay | Admitting: Internal Medicine

## 2010-10-01 ENCOUNTER — Ambulatory Visit (INDEPENDENT_AMBULATORY_CARE_PROVIDER_SITE_OTHER): Payer: Medicare Other | Admitting: Internal Medicine

## 2010-10-01 VITALS — BP 132/90 | HR 80 | Ht 61.0 in | Wt 134.0 lb

## 2010-10-01 DIAGNOSIS — I6350 Cerebral infarction due to unspecified occlusion or stenosis of unspecified cerebral artery: Secondary | ICD-10-CM

## 2010-10-01 DIAGNOSIS — I10 Essential (primary) hypertension: Secondary | ICD-10-CM

## 2010-10-01 DIAGNOSIS — R002 Palpitations: Secondary | ICD-10-CM

## 2010-10-01 MED ORDER — DILTIAZEM HCL 30 MG PO TABS: ORAL_TABLET | ORAL | Status: DC

## 2010-10-01 NOTE — Patient Instructions (Addendum)
Your physician recommends that you schedule a follow-up appointment in: 4- 6 WEEKS WITH DR Eden Emms  CANCEL  WED APPT.  Your physician has recommended you make the following change in your medication: STOP FLECAINIDE START CARDIZEM 30 MG 1 EVERY 6 HOURS AS NEEDED FOR HEART RACING.

## 2010-10-01 NOTE — Assessment & Plan Note (Signed)
The patient has persistent afib refractory to medical therapy with flecainide.  She appears to be moderately symptomatic but remains very active. Therapeutic strategies for afib including medicine and ablation were discussed in detail with the patient today.  With her moderate atrial enlargement and persistent afib, I think that her anticipated success rates from ablation are on the order of 60% and would likely require multiple procedures.  I therefore think that we should avoid ablation at this time. We discussed rate and rhythm control at length.  I have offered tikosyn vs amiodarone if she would like to pursue sinus rhythm.  Presently, she states that she is feeling reasonably well and would like to continue her present rate control strategy. I will therefore stop flecainide today.  I have given cardizem which she take take on an as needed basis.  If she wishes to pursue rhythm control in the future, I would recommend either tikosyn or amiodarone.  She will follow-up with Dr Eden Emms.  I will see her as needed.

## 2010-10-01 NOTE — Progress Notes (Signed)
[name] [name] is a pleasant 77 y.o. WF patient with a h/o persistent atrial fibrillation who presents today for EP consultation.  She reports initially being diagnosed with afib 05/2004 after developing post operative afib following L knee surgery.  She reports that she woke with acute CVA at that time. She was hospitalized at [facility] for this.  She has been on coumadin since that time.  She is not certain as to how often she has afib presently. She reports that she has had several presentations to her primary physician during which she was found to have afib and was asymptomatic.  She reports that she presented 3 months ago with fatigue and decreased energy.  She was placed on  Digoxin and metoprolol for rate control.  She underwent cardioversion 07/13/10.  She does not recall feeling any better.  She subsequently developed recurrent afib.  She was therefore placed on flecainide.  She underwent repeat cardioversion 09/10/10.  She feels that she was in sinus rhythm for 2-3 days before returning to afib.  She states that her energy improved after her most recent cardioversion, but decreased upon return to afib.  She feels that she has had a "pretty good week" this week but notes that heart rates remain elevated (90s).  She reports SOB with moderate activity.  She remains active however.  She states that she is "always on the go" and mowed her own grass earlier this week.  Today, she denies symptoms of  chest pain, orthopnea, PND, lower extremity edema, dizziness, presyncope, syncope, or neurologic sequela. The patient is tolerating medications without difficulties and is otherwise without complaint today.   Past Medical History  Diagnosis Date  . Atrial fibrillation     persistent, coumadin per PCP;  echo 06/06/09: EF 60%, mild LVH, Gr 1 diast dysfx, AV gradient mean 9, mod MR, mod LAE;  Myoview 11/09: normal, EF 81%  . TIA (transient ischemic attack)   . CVA (cerebral vascular accident)     2006  . HTN  (hypertension)   . Fatigue   . Dyspnea   . Giant cell arteritis     sees dr. at [facility]  . Back pain   . H/O: hysterectomy   . Subclavian artery stenosis, right     dopplers done 2/12;  carotids 2/12: 0-39% bilateral  . Hypercholesteremia     Secondary to vascular disease   Past Surgical History  Procedure Date  . Knee surgery 3/06    Left  . Appendectomy   . Abdominal hysterectomy   . Prolapsed uterine fibroid ligation 7/05    Current Outpatient Prescriptions  Medication Sig Dispense Refill  . ALPRAZolam (XANAX) 0.5 MG tablet 1/2 to 1 tablet as needed for anxiety      . enalapril (VASOTEC) 20 MG tablet Take 20 mg by mouth 2 (two) times daily.       Marland Kitchen HYDROcodone-acetaminophen (VICODIN) 5-500 MG per tablet Take 1 tablet by mouth every 6 (six) hours as needed.        . metoprolol (TOPROL-XL) 200 MG 24 hr tablet 1/2 tab po qd      . warfarin (COUMADIN) 5 MG tablet Take 5 mg by mouth as directed.        . zolpidem (AMBIEN) 10 MG tablet Take 10 mg by mouth at bedtime as needed.          No Known Allergies  History   Social History  . Marital Status: Married    Spouse Name: N/A  Number of Children: N/A  . Years of Education: N/A   Occupational History  . Not on file.   Social History Main Topics  . Smoking status: Never Smoker   . Smokeless tobacco: Not on file  . Alcohol Use: No  . Drug Use: No  . Sexually Active: Not on file   Other Topics Concern  . Not on file   Social History Narrative   She is happily married. Her husband was with her today. She lives between [place] and [place].  Retired Freight forwarder.    No family history on file.  ROS- All systems are reviewed and negative except as per the HPI above  Physical Exam: Filed Vitals:   10/01/10 0834  BP: 132/90  Pulse: 80  Height: 5\' 1"  (1.549 m)  Weight: 134 lb (60.782 kg)    GEN- The patient is thin appearing, alert and oriented x 3 today.   Head- normocephalic, atraumatic Eyes-  Sclera  clear, conjunctiva pink Ears- hearing intact Oropharynx- clear Neck- supple, no JVP Lymph- no cervical lymphadenopathy Lungs- Clear to ausculation bilaterally, normal work of breathing Heart- irregular rate and rhythm, no murmurs, rubs or gallops, PMI not laterally displaced GI- soft, NT, ND, + BS Extremities- no clubbing, cyanosis, or edema MS- agre appropriate muscle atrophy Skin- no rash or lesion Psych- euthymic mood, full affect Neuro- strength and sensation are intact  EKG today reveals coarse afib, V rate 80, Qtc 438 Echo from 3/11 reviewed  Assessment and Plan:

## 2010-10-01 NOTE — Assessment & Plan Note (Signed)
Continue lifelong anticoagulation with coumadin.

## 2010-10-01 NOTE — Assessment & Plan Note (Signed)
Stable No change required today  

## 2010-10-02 ENCOUNTER — Encounter: Payer: Self-pay | Admitting: Cardiovascular Disease

## 2010-10-03 ENCOUNTER — Ambulatory Visit: Payer: Medicare Other | Admitting: Cardiovascular Disease

## 2010-10-03 ENCOUNTER — Ambulatory Visit
Admission: RE | Admit: 2010-10-03 | Discharge: 2010-10-03 | Disposition: A | Discharge status: Home / Self Care | Payer: Medicare Other | Source: Ambulatory Visit | Attending: Internal Medicine | Admitting: Internal Medicine

## 2010-10-03 ENCOUNTER — Telehealth: Payer: Self-pay | Admitting: Internal Medicine

## 2010-10-03 DIAGNOSIS — Z1231 Encounter for screening mammogram for malignant neoplasm of breast: Secondary | ICD-10-CM

## 2010-10-03 NOTE — Telephone Encounter (Signed)
Called and spoke with husband  Patient in [place] with her sister at MD appointment She is going to call me back

## 2010-10-03 NOTE — Telephone Encounter (Signed)
Called back and

## 2010-10-03 NOTE — Telephone Encounter (Signed)
Pt calling wants to discuss medication when she having a racing heart.

## 2010-10-03 NOTE — Telephone Encounter (Signed)
Pt calling re questions she has re meds dr Johney Frame gave her

## 2010-10-03 NOTE — Telephone Encounter (Signed)
Called back and left message she is to take Cardizem 30mg  one tablet every 6 hours as needed for heart racing  And to call me back if needed

## 2010-10-15 ENCOUNTER — Encounter: Payer: Self-pay | Admitting: Internal Medicine

## 2010-10-17 ENCOUNTER — Telehealth: Payer: Self-pay | Admitting: Cardiovascular Disease

## 2010-10-17 MED ORDER — DILTIAZEM HCL ER COATED BEADS 120 MG PO CP24: ORAL_CAPSULE | ORAL | Status: DC

## 2010-10-17 NOTE — Telephone Encounter (Signed)
I talked pt. Pt states she has been waking up in the morning with chest discomfort feeling like her heart rate is fast. Pt states her heart rate has been 97-102 and her BP 132-141/99-100 in the morning after she takes metoprolol 100mg  and enalapril 20mg . She has taken Diltiazem 30mg  prn and alprazolam  in the morning about an hour after she takes the metoprolol and enalapril. This has helped her chest discomfort and fast heart rate. Pt is asking if she should take Diltiazem 30mg  regularly every morning. I reviewed with Dr Gala Romney. He recommended pt start Diltiazem CD 120mg  hs. Dr Gala Romney stated pt can still use Diltiazem 30mg  every 6 hours prn. I discussed with pt and she will start Diltiazem CD 120mg  hs. She will continue to take and record her BP and heart rate. She has an appt with Dr Eden Emms 11/01/10 for follow up.

## 2010-10-17 NOTE — Telephone Encounter (Signed)
Pt is having these spells where her heart is beating really fast and chest discomfort pt took diltazem pt has some SOB

## 2010-11-01 ENCOUNTER — Ambulatory Visit (INDEPENDENT_AMBULATORY_CARE_PROVIDER_SITE_OTHER): Payer: Medicare Other | Admitting: Cardiovascular Disease

## 2010-11-01 ENCOUNTER — Encounter: Payer: Self-pay | Admitting: Cardiovascular Disease

## 2010-11-01 DIAGNOSIS — I10 Essential (primary) hypertension: Secondary | ICD-10-CM

## 2010-11-01 DIAGNOSIS — E7800 Pure hypercholesterolemia, unspecified: Secondary | ICD-10-CM

## 2010-11-01 DIAGNOSIS — Z7901 Long term (current) use of anticoagulants: Secondary | ICD-10-CM

## 2010-11-01 DIAGNOSIS — M316 Other giant cell arteritis: Secondary | ICD-10-CM

## 2010-11-01 DIAGNOSIS — R079 Chest pain, unspecified: Secondary | ICD-10-CM

## 2010-11-01 DIAGNOSIS — I4891 Unspecified atrial fibrillation: Secondary | ICD-10-CM

## 2010-11-01 MED ORDER — NITROGLYCERIN 0.4 MG SL SUBL: 0.4000 mg | SUBLINGUAL_TABLET | SUBLINGUAL | Status: DC | PRN

## 2010-11-01 NOTE — Assessment & Plan Note (Signed)
Consider F/U UE duplex in 6 months.  No UE ischemic symptoms.  BP usually lower in right arm

## 2010-11-01 NOTE — Patient Instructions (Signed)
Your physician recommends that you schedule a follow-up appointment in: 6 weeks  

## 2010-11-01 NOTE — Assessment & Plan Note (Signed)
Declines Tikosyn and amiodarone.  Has seen Dr Johney Frame.  Continue beta blocker and cardizem and life long anticoagulation

## 2010-11-01 NOTE — Progress Notes (Signed)
[name] [name] is a pleasant 77 y.o. WF patient with a h/o persistent atrial fibrillation who presents today for FU  She reports initially being diagnosed with afib 05/2004 after developing post operative afib following L knee surgery. She reports that she woke with acute CVA at that time. She was hospitalized at [facility] for this. She has been on coumadin since that time. She is not certain as to how often she has afib presently. She reports that she has had several presentations to her primary physician during which she was found to have afib and was asymptomatic. She reports that she presented 3 months ago with fatigue and decreased energy. She was placed on Digoxin and metoprolol for rate control. She underwent cardioversion 07/13/10. She does not recall feeling any better. She subsequently developed recurrent afib. She was therefore placed on flecainide. She underwent repeat cardioversion 09/10/10. She feels that she was in sinus rhythm for 2-3 days before returning to afib. She states that her energy improved after her most recent cardioversion, but decreased upon return to afib. She feels that she has had a "pretty good week" this week but notes that heart rates remain elevated (90s). She reports SOB with moderate activity. She remains active however. She states that she is "always on the go" and mowed her own grass earlier this week.  Saw Dr Johney Frame and declined Tikosyn or Amiodarone.    Bear  also has significant  vasculitis, with subclavian stenosis on the right, elevated chol. , HTN . She is on chronic coumdin which is followed by Dr. Mikey Bussing. Her levels have been therapeutic She sees Dr's in [place] for her giant cell arteritis and is now off  methotrexate and low dose prednisone. Dr. Darrick Penna has turned her down for surgery. She has chronic rigth arm weakness. She has had bruits in the neck but no carotid stenosis by duplex most recently 2009. She has not had any symptoms of TIA, SSCP palpitations, or syncope.  I reviewed her CTA from 9/10. I think it is stable. She clearly has subclavian stenosis on the right with stable symptoms but has 4 vessel run-off to the head without critical carotid stenosis.  Very emotional today.  Husband is an alcoholic and Rx her poorly.  3 adult children not totally involved or supportive.  May be related to increased palpitations and higher heart rates.  Taking short acting cardizem with some relief.  Does not have nitro.     Nonischemic myovue 5/1 with normal EF   ROS: Denies fever, malais, weight loss, blurry vision, decreased visual acuity, cough, sputum, SOB, hemoptysis, pleuritic pain, palpitaitons, heartburn, abdominal pain, melena, lower extremity edema, claudication, or rash.  All other systems reviewed and negative  General: Affect appropriate Emotiponal and crying HEENT: normal Neck supple with no adenopathy JVP normal right bruits with decreased pulse in right arm  no thyromegaly Lungs clear with no wheezing and good diaphragmatic motion Heart:  S1/S2 no murmur,rub, gallop or click PMI normal Abdomen: benighn, BS positve, no tenderness, no AAA no bruit.  No HSM or HJR Distal pulses intact with no bruits No edema Neuro non-focal Skin warm and dry No muscular weakness   Current Outpatient Prescriptions  Medication Sig Dispense Refill  . ALPRAZolam (XANAX) 0.5 MG tablet 1/2 to 1 tablet as needed for anxiety      . diltiazem (CARDIZEM CD) 120 MG 24 hr capsule Take 1 at bedtime  30 capsule  6  . diltiazem (CARDIZEM) 30 MG tablet 1 EVERY  6 HOURS AS NEEDED FOR RACING HEART ./CY  60 tablet  6  . enalapril (VASOTEC) 20 MG tablet Take 20 mg by mouth 2 (two) times daily.       Marland Kitchen HYDROcodone-acetaminophen (VICODIN) 5-500 MG per tablet Take 1 tablet by mouth every 6 (six) hours as needed.        . metoprolol (TOPROL-XL) 200 MG 24 hr tablet 1/2 tab po qd      . warfarin (COUMADIN) 5 MG tablet Take 5 mg by mouth as directed.        . zolpidem (AMBIEN) 10 MG  tablet Take 10 mg by mouth at bedtime as needed.          Allergies  Review of patient's allergies indicates no known allergies.  Electrocardiogram:  10/01/10  RAte 80 afib/flutter poor R wave progression no acute changes  Assessment and Plan

## 2010-11-01 NOTE — Assessment & Plan Note (Signed)
Cholesterol is at goal.  Continue current dose of statin and diet Rx.  No myalgias or side effects.  F/U  LFT's in 6 months. No results found for this basename: LDLCALC             

## 2010-11-01 NOTE — Assessment & Plan Note (Signed)
Vascular disease but normal myovue 5/11. Nitro called in [place] in a few weeks.  Given vascular disease low threshhold to cath.

## 2010-11-01 NOTE — Assessment & Plan Note (Signed)
F/U Dr Mikey Bussing.  No bleeding issues.

## 2010-11-01 NOTE — Assessment & Plan Note (Signed)
Well controlled.  Continue current medications and low sodium Dash type diet.    

## 2010-11-14 ENCOUNTER — Telehealth: Payer: Self-pay | Admitting: Cardiovascular Disease

## 2010-11-14 NOTE — Telephone Encounter (Signed)
Patient calling c/o heart rate down in the 70 also  Weakness.

## 2010-11-14 NOTE — Telephone Encounter (Signed)
Spoke with pt, she reports that her heart rate is up and down during the day. It can be above 100 when she gets up and then will go down into the 60's. She is checking her pulse every 6 hours and taking cardizem short acting if pulse rate ok. She is feeling weak and wonders if it is due to heart rate variations. She is taking cardizem cd 240 mg at bedtime. May need to change to long acting for the day. Will forward for dr Eden Emms review  [name] [name]

## 2010-11-16 NOTE — Telephone Encounter (Signed)
Refer to EPS failed [facility]  May benefit from AV node ablation .

## 2010-11-19 NOTE — Telephone Encounter (Signed)
PT AWARE OF THE NEED TO F/U WITH DR ALLRED PER DR Eden Emms.APPTSCHEDULED  FOR  10:15  ON 12/24/10 INSTRUCTED TO CONT WITH SAME MEDS AND TO MONITOR HR AND B/P IF HR INCREASES AND OR STAYS ELEVATED FOR LONGER PERIOD OF TIME TO CALL BACK PT VERBALIZED UNDERSTANDING.

## 2010-12-18 ENCOUNTER — Ambulatory Visit: Payer: Medicare Other | Admitting: Cardiovascular Disease

## 2010-12-24 ENCOUNTER — Encounter: Payer: Self-pay | Admitting: Internal Medicine

## 2010-12-24 ENCOUNTER — Ambulatory Visit (INDEPENDENT_AMBULATORY_CARE_PROVIDER_SITE_OTHER): Payer: Medicare Other | Admitting: Internal Medicine

## 2010-12-24 VITALS — BP 150/80 | HR 115 | Ht 61.0 in | Wt 133.0 lb

## 2010-12-24 DIAGNOSIS — I4891 Unspecified atrial fibrillation: Secondary | ICD-10-CM

## 2010-12-24 DIAGNOSIS — I10 Essential (primary) hypertension: Secondary | ICD-10-CM

## 2010-12-24 MED ORDER — METOPROLOL SUCCINATE ER 200 MG PO TB24: 100.0000 mg | ORAL_TABLET | Freq: Every day | ORAL | Status: DC

## 2010-12-24 MED ORDER — DILTIAZEM HCL ER COATED BEADS 120 MG PO CP24: 120.0000 mg | ORAL_CAPSULE | Freq: Every day | ORAL | Status: DC

## 2010-12-24 NOTE — Assessment & Plan Note (Signed)
The patient has persistent afib refractory to medical therapy with flecainide.  She appears to be moderately symptomatic but remains very active. With her moderate atrial enlargement and persistent afib, I think that our ability to maintain sinus rhythm would be low long term. We will continue rate control at this time.  Today, he heart rates are quite high.  She takes 1/2 of a 200mg  tablet of toprol in the am and 1/4 tablet in the pm.  She admits that precision with 1/4 tablet of toprol is difficult.  She is not sure why her diltiazem has been stopped. As her heart rates are quite high today, I will adjust her medications as follows: 1. decreas toprol to 100mg  daily 2. Add diltiazem CD 120mg  daily.  Diltiazem can be further uptitrated as required to maintain rate control.  If she wishes to pursue rhythm control in the future, I would recommend either tikosyn or amiodarone.    Continue coumadin long term

## 2010-12-24 NOTE — Assessment & Plan Note (Signed)
Above goal Follow BP with addition of diltiazem

## 2010-12-24 NOTE — Progress Notes (Signed)
[name] [name] is a pleasant 77 y.o. WF patient with a h/o persistent atrial fibrillation who presents today for EP follow-up.  She reports initially being diagnosed with afib 05/2004 after developing post operative afib following L knee surgery.  She reports that she woke with acute CVA at that time. She was hospitalized at [facility] for this.  She has been on coumadin since that time.  She is not certain as to how often she has afib presently. She reports that she has had several presentations to her primary physician during which she was found to have afib and was asymptomatic.  She reports that she presented 3 months ago with fatigue and decreased energy.  She was placed on  Digoxin and metoprolol for rate control.  She underwent cardioversion 07/13/10.  She does not recall feeling any better.  She subsequently developed recurrent afib.  She was therefore placed on flecainide.  She underwent repeat cardioversion 09/10/10.  She feels that she was in sinus rhythm for 2-3 days before returning to afib.  She states that her energy improved after her most recent cardioversion, but decreased upon return to afib.  She reports SOB with moderate activity.  She remains active however.  Today, she denies symptoms of  chest pain, orthopnea, PND, lower extremity edema, dizziness, presyncope, syncope, or neurologic sequela. The patient is tolerating medications without difficulties and is otherwise without complaint today.   Past Medical History  Diagnosis Date  . Atrial fibrillation     persistent, coumadin per PCP;  echo 06/06/09: EF 60%, mild LVH, Gr 1 diast dysfx, AV gradient mean 9, mod MR, mod LAE;  Myoview 11/09: normal, EF 81%  . TIA (transient ischemic attack)   . CVA (cerebral vascular accident)     2006  . HTN (hypertension)   . Fatigue   . Dyspnea   . Giant cell arteritis     sees dr. at [facility]  . Back pain   . H/O: hysterectomy   . Subclavian artery stenosis, right     dopplers done 2/12;  carotids 2/12:  0-39% bilateral  . Hypercholesteremia     Secondary to vascular disease   Past Surgical History  Procedure Date  . Knee surgery 3/06    Left  . Appendectomy   . Abdominal hysterectomy   . Prolapsed uterine fibroid ligation 7/05    Current Outpatient Prescriptions  Medication Sig Dispense Refill  . enalapril (VASOTEC) 20 MG tablet Take 20 mg by mouth 2 (two) times daily.       Marland Kitchen esomeprazole (NEXIUM) 40 MG capsule Take 40 mg by mouth daily before breakfast.        . HYDROcodone-acetaminophen (VICODIN) 5-500 MG per tablet Take 1 tablet by mouth every 6 (six) hours as needed.        . metoprolol (TOPROL-XL) 200 MG 24 hr tablet Take 0.5 tablets (100 mg total) by mouth daily.  15 tablet  11  . nitroGLYCERIN (NITROSTAT) 0.4 MG SL tablet Place 1 tablet (0.4 mg total) under the tongue every 5 (five) minutes as needed for chest pain.  25 tablet  3  . warfarin (COUMADIN) 5 MG tablet Take 5 mg by mouth as directed.        . zolpidem (AMBIEN) 10 MG tablet Take 10 mg by mouth at bedtime as needed.        . diltiazem (DILT-CD) 120 MG 24 hr capsule Take 1 capsule (120 mg total) by mouth daily.  30 capsule  11  No Known Allergies  History   Social History  . Marital Status: Married    Spouse Name: N/A    Number of Children: N/A  . Years of Education: N/A   Occupational History  . Not on file.   Social History Main Topics  . Smoking status: Never Smoker   . Smokeless tobacco: Not on file  . Alcohol Use: No  . Drug Use: No  . Sexually Active: Not on file   Other Topics Concern  . Not on file   Social History Narrative   She is happily married. Her husband was with her today. She lives between [place] and [place].  Retired Freight forwarder.    No family history on file.  ROS- All systems are reviewed and negative except as per the HPI above  Physical Exam: Filed Vitals:   12/24/10 1053  BP: 150/80  Pulse: 115  Height: 5\' 1"  (1.549 m)  Weight: 133 lb (60.328 kg)    GEN-  The patient is thin appearing, alert and oriented x 3 today.   Head- normocephalic, atraumatic Eyes-  Sclera clear, conjunctiva pink Ears- hearing intact Oropharynx- clear Neck- supple, no JVP Lymph- no cervical lymphadenopathy Lungs- Clear to ausculation bilaterally, normal work of breathing Heart- irregular tachycardic rhythm, no murmurs, rubs or gallops, PMI not laterally displaced GI- soft, NT, ND, + BS Extremities- no clubbing, cyanosis, or edema MS- agre appropriate muscle atrophy Skin- no rash or lesion Psych- euthymic mood, full affect Neuro- strength and sensation are intact  EKG today reveals coarse afib, V rate 115, Qtc 453 Echo from 3/11 reviewed  Assessment and Plan:

## 2010-12-24 NOTE — Patient Instructions (Signed)
Your physician has recommended you make the following change in your medication: Decrease Metoprolol to 100mg  (1/2 tablet) daily.  Start Cardizem CD 120mg  1 tablet daily. Your physician recommends that you schedule a follow-up appointment in: 4-6 weeks with Dr. Johney Frame.

## 2011-01-23 ENCOUNTER — Encounter: Payer: Self-pay | Admitting: Internal Medicine

## 2011-01-23 ENCOUNTER — Ambulatory Visit (INDEPENDENT_AMBULATORY_CARE_PROVIDER_SITE_OTHER): Payer: Medicare Other | Admitting: Internal Medicine

## 2011-01-23 DIAGNOSIS — I10 Essential (primary) hypertension: Secondary | ICD-10-CM

## 2011-01-23 DIAGNOSIS — R002 Palpitations: Secondary | ICD-10-CM

## 2011-01-23 DIAGNOSIS — R079 Chest pain, unspecified: Secondary | ICD-10-CM

## 2011-01-23 DIAGNOSIS — I4891 Unspecified atrial fibrillation: Secondary | ICD-10-CM

## 2011-01-23 NOTE — Progress Notes (Signed)
[name] [name] is a pleasant 77 y.o. WF patient with a h/o persistent atrial fibrillation who presents today for EP follow-up.  She reports initially being diagnosed with afib 05/2004 after developing post operative afib following L knee surgery.  She reports that she woke with acute CVA at that time. She was hospitalized at [facility] for this.  She has been on coumadin since that time.  She is not certain as to how often she has afib presently. She reports that she has had several presentations to her primary physician during which she was found to have afib and was asymptomatic. She underwent cardioversion 07/13/10.  She does not recall feeling any better.  She subsequently developed recurrent afib.  She was therefore placed on flecainide.  She underwent repeat cardioversion 09/10/10.  She feels that she was in sinus rhythm for 2-3 days before returning to afib.  I have offered tikosyn or amiodarone, though she has declined.  She has done well with a rate control strategy recently.   Today, she denies symptoms of  chest pain, orthopnea, PND, lower extremity edema, dizziness, presyncope, syncope, or neurologic sequela.  The patient is tolerating medications without difficulties and is otherwise without complaint today.   Past Medical History  Diagnosis Date  . Atrial fibrillation     persistent, coumadin per PCP;  echo 06/06/09: EF 60%, mild LVH, Gr 1 diast dysfx, AV gradient mean 9, mod MR, mod LAE;  Myoview 11/09: normal, EF 81%  . TIA (transient ischemic attack)   . CVA (cerebral vascular accident)     2006  . HTN (hypertension)   . Fatigue   . Dyspnea   . Giant cell arteritis     sees dr. at [facility]  . Back pain   . H/O: hysterectomy   . Subclavian artery stenosis, right     dopplers done 2/12;  carotids 2/12: 0-39% bilateral  . Hypercholesteremia     Secondary to vascular disease   Past Surgical History  Procedure Date  . Knee surgery 3/06    Left  . Appendectomy   . Abdominal hysterectomy   .  Prolapsed uterine fibroid ligation 7/05    Current Outpatient Prescriptions  Medication Sig Dispense Refill  . CRESTOR 10 MG tablet Take 10 mg by mouth daily.       Marland Kitchen diltiazem (DILT-CD) 120 MG 24 hr capsule Take 1 capsule (120 mg total) by mouth daily.  30 capsule  11  . enalapril (VASOTEC) 20 MG tablet Take 20 mg by mouth 2 (two) times daily.       Marland Kitchen HYDROcodone-acetaminophen (VICODIN) 5-500 MG per tablet Take 1 tablet by mouth every 6 (six) hours as needed.        . meloxicam (MOBIC) 7.5 MG tablet Take 7.5 mg by mouth daily as needed.        . metoprolol (TOPROL-XL) 200 MG 24 hr tablet Take 0.5 tablets (100 mg total) by mouth daily.  15 tablet  11  . nitroGLYCERIN (NITROSTAT) 0.4 MG SL tablet Place 1 tablet (0.4 mg total) under the tongue every 5 (five) minutes as needed for chest pain.  25 tablet  3  . warfarin (COUMADIN) 5 MG tablet Take 5 mg by mouth as directed.        . zolpidem (AMBIEN) 10 MG tablet Take 5 mg by mouth at bedtime as needed.         No Known Allergies  History   Social History  . Marital Status: Married  Spouse Name: N/A    Number of Children: N/A  . Years of Education: N/A   Occupational History  . Not on file.   Social History Main Topics  . Smoking status: Never Smoker   . Smokeless tobacco: Not on file  . Alcohol Use: No  . Drug Use: No  . Sexually Active: Not on file   Other Topics Concern  . Not on file   Social History Narrative   She is happily married. Her husband was with her today. She lives between [place] and [place].  Retired Freight forwarder.    Physical Exam: Filed Vitals:   01/23/11 1110  BP: 126/90  Pulse: 95  Height: 5\' 1"  (1.549 m)  Weight: 136 lb (61.689 kg)    GEN- The patient is thin appearing, alert and oriented x 3 today.   Head- normocephalic, atraumatic Eyes-  Sclera clear, conjunctiva pink Ears- hearing intact Oropharynx- clear Neck- supple, no JVP Lymph- no cervical lymphadenopathy Lungs- Clear to  ausculation bilaterally, normal work of breathing Heart- irregular rate and rhythm, no murmurs, rubs or gallops, PMI not laterally displaced GI- soft, NT, ND, + BS Extremities- no clubbing, cyanosis, or edema MS- agre appropriate muscle atrophy Skin- no rash or lesion Psych- euthymic mood, full affect Neuro- strength and sensation are intact  EKG today reveals coarse afib, V rate 95, Qtc 417 Echo from 3/11 reviewed  Assessment and Plan:

## 2011-01-23 NOTE — Assessment & Plan Note (Signed)
Stable No change required today   Return in 3 months Follow-up with Dr Eden Emms as scheduled.

## 2011-01-23 NOTE — Patient Instructions (Signed)
Your physician recommends that you schedule a follow-up appointment in 3 months with Dr Allred    

## 2011-01-23 NOTE — Assessment & Plan Note (Signed)
The patient has persistent afib refractory to medical therapy with flecainide.  She appears to be less symptomatic with afib at this time.  She remains very active.   With her moderate atrial enlargement and persistent afib, I think that our ability to maintain sinus rhythm would be low long term. We will continue rate control at this time.     If she wishes to pursue rhythm control in the future, I would recommend either tikosyn or amiodarone.    Continue coumadin long term

## 2011-01-29 NOTE — Progress Notes (Signed)
Addended by: Micki Riley C on: 01/29/2011 02:08 PM   Modules accepted: Orders

## 2011-04-29 ENCOUNTER — Ambulatory Visit (INDEPENDENT_AMBULATORY_CARE_PROVIDER_SITE_OTHER): Payer: Medicare Other | Admitting: Internal Medicine

## 2011-04-29 ENCOUNTER — Encounter: Payer: Self-pay | Admitting: Internal Medicine

## 2011-04-29 ENCOUNTER — Ambulatory Visit: Payer: Medicare Other | Admitting: Internal Medicine

## 2011-04-29 VITALS — BP 122/64 | HR 74 | Wt 143.8 lb

## 2011-04-29 DIAGNOSIS — E7800 Pure hypercholesterolemia, unspecified: Secondary | ICD-10-CM

## 2011-04-29 DIAGNOSIS — I10 Essential (primary) hypertension: Secondary | ICD-10-CM

## 2011-04-29 DIAGNOSIS — I4891 Unspecified atrial fibrillation: Secondary | ICD-10-CM

## 2011-04-29 LAB — CBC WITH DIFFERENTIAL/PLATELET
Basophils Absolute: 0 10*3/uL (ref 0.0–0.1)
Eosinophils Relative: 1.5 % (ref 0.0–5.0)
Hemoglobin: 13.6 g/dL (ref 12.0–15.0)
Lymphocytes Relative: 25.3 % (ref 12.0–46.0)
Monocytes Relative: 7.8 % (ref 3.0–12.0)
Platelets: 200 10*3/uL (ref 150.0–400.0)
RDW: 16.7 % — ABNORMAL HIGH (ref 11.5–14.6)
WBC: 7.9 10*3/uL (ref 4.5–10.5)

## 2011-04-29 LAB — HEPATIC FUNCTION PANEL
AST: 22 U/L (ref 0–37)
Alkaline Phosphatase: 95 U/L (ref 39–117)
Total Bilirubin: 0.5 mg/dL (ref 0.3–1.2)

## 2011-04-29 LAB — BASIC METABOLIC PANEL
BUN: 18 mg/dL (ref 6–23)
Calcium: 9.2 mg/dL (ref 8.4–10.5)
GFR: 60.15 mL/min (ref >60.00)
Glucose, Bld: 91 mg/dL (ref 70–99)
Sodium: 141 mEq/L (ref 135–145)

## 2011-04-29 NOTE — Assessment & Plan Note (Signed)
Check lfts today

## 2011-04-29 NOTE — Assessment & Plan Note (Addendum)
The patient has longstanding persistent afib refractory to medical therapy with flecainide.  She appears to be asymptomatic with afib at this time.  She remains very active.    With her moderate atrial enlargement and persistent afib, I think that our ability to maintain sinus rhythm would be low long term. We will continue rate control long term.     Continue coumadin long term Cbc today

## 2011-04-29 NOTE — Patient Instructions (Addendum)
Your physician wants you to follow-up in: 6 months with Dr Jacquiline Doe will receive a reminder letter in the mail two months in advance. If you don't receive a letter, please call our office to schedule the follow-up appointment.   Your physician recommends that you return for lab work today:  BMP/Liver panel/CBC

## 2011-04-29 NOTE — Assessment & Plan Note (Addendum)
Stable  bmet today No change required today

## 2011-04-29 NOTE — Progress Notes (Signed)
PCP:  Lindwood Qua, MD, MD Primary Cardiologist:  Dr Eden Emms  The patient presents today for routine electrophysiology followup.  Since last being seen in our clinic, the patient reports doing very well.  She remains active despite her age.  She appears to be minimally symptomatic with afib.  Today, she denies symptoms of palpitations, chest pain, shortness of breath, orthopnea, PND, lower extremity edema, dizziness, presyncope, syncope, or neurologic sequela.  The patient feels that she is tolerating medications without difficulties and is otherwise without complaint today.   Past Medical History  Diagnosis Date  . Atrial fibrillation     persistent, coumadin per PCP;  echo 06/06/09: EF 60%, mild LVH, Gr 1 diast dysfx, AV gradient mean 9, mod MR, mod LAE;  Myoview 11/09: normal, EF 81%  . TIA (transient ischemic attack)   . CVA (cerebral vascular accident)     2006  . HTN (hypertension)   . Fatigue   . Dyspnea   . Giant cell arteritis     sees dr. at [facility]  . Back pain   . H/O: hysterectomy   . Subclavian artery stenosis, right     dopplers done 2/12;  carotids 2/12: 0-39% bilateral  . Hypercholesteremia     Secondary to vascular disease   Past Surgical History  Procedure Date  . Knee surgery 3/06    Left  . Appendectomy   . Abdominal hysterectomy   . Prolapsed uterine fibroid ligation 7/05    Current Outpatient Prescriptions  Medication Sig Dispense Refill  . CRESTOR 10 MG tablet Take 10 mg by mouth as needed.       . diltiazem (DILT-CD) 120 MG 24 hr capsule Take 1 capsule (120 mg total) by mouth daily.  30 capsule  11  . enalapril (VASOTEC) 20 MG tablet Take 20 mg by mouth 2 (two) times daily.       Marland Kitchen HYDROcodone-acetaminophen (VICODIN) 5-500 MG per tablet Take 1 tablet by mouth every 6 (six) hours as needed.        . meloxicam (MOBIC) 7.5 MG tablet Take 7.5 mg by mouth daily as needed.        . metoprolol (TOPROL-XL) 200 MG 24 hr tablet Take 0.5 tablets (100 mg total) by  mouth daily.  15 tablet  11  . nitroGLYCERIN (NITROSTAT) 0.4 MG SL tablet Place 1 tablet (0.4 mg total) under the tongue every 5 (five) minutes as needed for chest pain.  25 tablet  3  . warfarin (COUMADIN) 5 MG tablet Take 5 mg by mouth as directed.        . zolpidem (AMBIEN) 10 MG tablet Take 5 mg by mouth at bedtime as needed.         No Known Allergies  History   Social History  . Marital Status: Married    Spouse Name: N/A    Number of Children: N/A  . Years of Education: N/A   Occupational History  . Not on file.   Social History Main Topics  . Smoking status: Never Smoker   . Smokeless tobacco: Not on file  . Alcohol Use: No  . Drug Use: No  . Sexually Active: Not on file   Other Topics Concern  . Not on file   Social History Narrative   She is happily married. Her husband was with her today. She lives between [place] and [place].  Retired Freight forwarder.    Physical Exam: Filed Vitals:   04/29/11 1125  BP: 122/64  Pulse: 74  Weight: 143 lb 12.8 oz (65.227 kg)    GEN- The patient is well appearing, alert and oriented x 3 today.   Head- normocephalic, atraumatic Eyes-  Sclera clear, conjunctiva pink Ears- hearing intact Oropharynx- clear Neck- supple, no JVP Lymph- no cervical lymphadenopathy Lungs- Clear to ausculation bilaterally, normal work of breathing Heart- irregular rate and rhythm, 2/6 SEM LUSB (early peaking) GI- soft, NT, ND, + BS Extremities- no clubbing, cyanosis, or edema  ekg today reveals afib, V rate 74 bpm, otherwise normal ekg   Assessment and Plan:

## 2011-09-26 ENCOUNTER — Other Ambulatory Visit: Payer: Self-pay | Admitting: Internal Medicine

## 2011-09-26 DIAGNOSIS — Z1231 Encounter for screening mammogram for malignant neoplasm of breast: Secondary | ICD-10-CM

## 2011-10-17 ENCOUNTER — Ambulatory Visit: Payer: Medicare Other

## 2011-10-22 ENCOUNTER — Ambulatory Visit
Admission: RE | Admit: 2011-10-22 | Discharge: 2011-10-22 | Disposition: A | Discharge status: Home / Self Care | Payer: Medicare Other | Source: Ambulatory Visit | Attending: Internal Medicine | Admitting: Internal Medicine

## 2011-10-22 DIAGNOSIS — Z1231 Encounter for screening mammogram for malignant neoplasm of breast: Secondary | ICD-10-CM

## 2011-12-20 ENCOUNTER — Other Ambulatory Visit: Payer: Self-pay | Admitting: *Deleted

## 2011-12-20 MED ORDER — DILTIAZEM HCL ER COATED BEADS 120 MG PO CP24: 120.0000 mg | ORAL_CAPSULE | Freq: Every day | ORAL | Status: DC

## 2011-12-20 NOTE — Telephone Encounter (Signed)
Fax Received. Refill Completed. Salvatore Poe Chowoe (R.M.A)   

## 2014-10-10 ENCOUNTER — Other Ambulatory Visit: Payer: Self-pay

## 2014-10-10 ENCOUNTER — Encounter: Payer: Self-pay | Admitting: Internal Medicine

## 2014-10-10 ENCOUNTER — Telehealth: Payer: Self-pay

## 2014-10-10 ENCOUNTER — Ambulatory Visit (INDEPENDENT_AMBULATORY_CARE_PROVIDER_SITE_OTHER): Payer: Medicare Other | Admitting: Internal Medicine

## 2014-10-10 VITALS — BP 130/90 | HR 95 | Ht 62.0 in | Wt 126.2 lb

## 2014-10-10 DIAGNOSIS — I10 Essential (primary) hypertension: Secondary | ICD-10-CM

## 2014-10-10 DIAGNOSIS — I481 Persistent atrial fibrillation: Secondary | ICD-10-CM

## 2014-10-10 DIAGNOSIS — I4819 Other persistent atrial fibrillation: Secondary | ICD-10-CM

## 2014-10-10 DIAGNOSIS — I480 Paroxysmal atrial fibrillation: Secondary | ICD-10-CM | POA: Diagnosis not present

## 2014-10-10 NOTE — Patient Instructions (Signed)
Medication Instructions:  Your physician recommends that you continue on your current medications as directed. Please refer to the Current Medication list given to you today.   Labwork: None ordered  Testing/Procedures: Your physician has requested that you have an echocardiogram. Echocardiography is a painless test that uses sound waves to create images of your heart. It provides your doctor with information about the size and shape of your heart and how well your heart's chambers and valves are working. This procedure takes approximately one hour. There are no restrictions for this procedure.    Follow-Up: Your physician recommends that you schedule a follow-up appointment in: 4 weeks with Roderic Palau, NP   Any Other Special Instructions Will Be Listed Below (If Applicable).

## 2014-10-11 ENCOUNTER — Encounter: Payer: Self-pay | Admitting: Internal Medicine

## 2014-10-11 NOTE — Progress Notes (Signed)
Electrophysiology Office Note   Date:  10/11/2014   ID:  JOUD PETTINATO, DOB 1929-08-10, MRN [id number]  PCP:  Raelene Bott, MD   Primary Electrophysiologist: Thompson Grayer, MD    Chief Complaint  Patient presents with  . Atrial Fibrillation     History of Present Illness: [name] [name] is a 81 y.o. female who presents today for electrophysiology evaluation.   She lives in [place].  I saw her several years ago with persistent atrial fibrillation.  At that time, she appeared to be minimally symptomatic with her afib.  We therefore decided upon rate control as our longterm strategy.  She says that she went to [facility] for a second opinion and was started on tikosyn.  She now feels that she can tell when in afib.  She reports palpitations and fatigue, though she continues to be active. She reports good afib control until recently.  Over the past few weeks her afib is more prominent.  She thinks that she went back into afib this morning.  Today, she denies symptoms of chest pain, shortness of breath, orthopnea, PND, lower extremity edema, claudication, dizziness, presyncope, syncope, bleeding, or neurologic sequela. The patient is tolerating medications without difficulties and is otherwise without complaint today.    Past Medical History  Diagnosis Date  . Atrial fibrillation     persistent, coumadin per PCP;  echo 06/06/09: EF 60%, mild LVH, Gr 1 diast dysfx, AV gradient mean 9, mod MR, mod LAE;  Myoview 11/09: normal, EF 81%  . TIA (transient ischemic attack)   . CVA (cerebral vascular accident)     2006  . HTN (hypertension)   . Fatigue   . Dyspnea   . Giant cell arteritis     sees dr. at [facility]  . Back pain   . H/O: hysterectomy   . Subclavian artery stenosis, right     dopplers done 2/12;  carotids 2/12: 0-39% bilateral  . Hypercholesteremia     Secondary to vascular disease   Past Surgical History  Procedure Laterality Date  . Knee surgery  3/06    Left  . Appendectomy     . Abdominal hysterectomy    . Prolapsed uterine fibroid ligation  7/05     Current Outpatient Prescriptions  Medication Sig Dispense Refill  . ALPRAZolam (XANAX) 0.5 MG tablet Take 1 tablet by mouth 2 (two) times daily as needed. Sleep  1  . amLODipine (NORVASC) 5 MG tablet Take 5 mg by mouth daily.  11  . atorvastatin (LIPITOR) 20 MG tablet Take 0.5-1 tablets by mouth daily.  11  . azelastine (ASTELIN) 0.1 % nasal spray Place 2 sprays into both nostrils 2 (two) times daily as needed for rhinitis or allergies. Use in each nostril as directed    . cephALEXin (KEFLEX) 500 MG capsule Take 500 mg by mouth 4 (four) times daily.    . enalapril (VASOTEC) 20 MG tablet Take 20 mg by mouth 2 (two) times daily.     Marland Kitchen HYDROcodone-acetaminophen (NORCO/VICODIN) 5-325 MG per tablet Take 1 tablet by mouth every 8 (eight) hours as needed. pain    . Magnesium 400 MG CAPS Take 1 capsule by mouth daily.    . metoprolol (TOPROL-XL) 200 MG 24 hr tablet Take 1/2  tablet by mouth in the morning and take 1 tablet by mouth in the afternoon  6  . nitroGLYCERIN (NITROSTAT) 0.4 MG SL tablet Place 1 tablet (0.4 mg total) under the tongue every 5 (five)  minutes as needed for chest pain. 25 tablet 3  . TIKOSYN 250 MCG capsule Take 1 capsule by mouth every 12 (twelve) hours.  2  . warfarin (COUMADIN) 5 MG tablet Take 5 mg by mouth as directed.       No current facility-administered medications for this visit.    Allergies:   Ciprofloxacin; Escitalopram; and Ranitidine   Social History:  The patient  reports that she has never smoked. She does not have any smokeless tobacco history on file. She reports that she does not drink alcohol or use illicit drugs.   Family History:  The patient's  family history includes Hypertension in an other family member.    ROS:  Please see the history of present illness.   All other systems are reviewed and negative.    PHYSICAL EXAM: VS:  BP 130/90 mmHg  Pulse 95  Ht 5\' 2"  (1.575  m)  Wt 57.244 kg (126 lb 3.2 oz)  BMI 23.08 kg/m2 , BMI Body mass index is 23.08 kg/(m^2). GEN: Well nourished, well developed, in no acute distress HEENT: normal Neck: no JVD, carotid bruits, or masses Cardiac: iRRR; no murmurs, rubs, or gallops,no edema  Respiratory:  clear to auscultation bilaterally, normal work of breathing GI: soft, nontender, nondistended, + BS MS: no deformity or atrophy Skin: warm and dry  Neuro:  Strength and sensation are intact Psych: euthymic mood, full affect  EKG:  EKG is ordered today. The ekg ordered today shows afib   Recent Labs: No results found for requested labs within last 365 days.    Lipid Panel  No results found for: CHOL, TRIG, HDL, CHOLHDL, VLDL, LDLCALC, LDLDIRECT   Wt Readings from Last 3 Encounters:  10/10/14 57.244 kg (126 lb 3.2 oz)  04/29/11 65.227 kg (143 lb 12.8 oz)  01/23/11 61.689 kg (136 lb)      Other studies Reviewed: Additional studies/ records that were reviewed today include: epic records Review of the above records today demonstrates: my prior note from 2013 is reviewed   ASSESSMENT AND PLAN:  1.  Persistent afib She has done very well with tikosyn, though over the past few weeks, her afib has been more prominent.  She is not ready to make changes today, but may be willing to consider amiodarone if her afib does not improve. I would avoid ablation given her advanced age if possible. chads2vasc score is at least 7.  Continue anticoagulation Echo to evaluate LA size and exclude valvular disease.  2. HTN Stable No change required today  Follow-up with Roderic Palau NP in the AF clinic in 4 weeks and then every 3 months thereafter.  I will see when needed.  Current medicines are reviewed at length with the patient today.   The patient does not have concerns regarding her medicines.  The following changes were made today:  none  Labs/ tests ordered today include:  Orders Placed This Encounter  Procedures   . EKG 12-Lead  . ECHOCARDIOGRAM COMPLETE     Signed, Thompson Grayer, MD  10/11/2014 11:06 PM     [place] [address] [place] [address] [phone] (office) [phone] (fax)

## 2014-10-17 ENCOUNTER — Other Ambulatory Visit: Payer: Self-pay

## 2014-10-17 ENCOUNTER — Ambulatory Visit (HOSPITAL_COMMUNITY): Payer: Medicare Other | Attending: Cardiology

## 2014-10-17 DIAGNOSIS — I340 Nonrheumatic mitral (valve) insufficiency: Secondary | ICD-10-CM | POA: Insufficient documentation

## 2014-10-17 DIAGNOSIS — I10 Essential (primary) hypertension: Secondary | ICD-10-CM | POA: Insufficient documentation

## 2014-10-17 DIAGNOSIS — I071 Rheumatic tricuspid insufficiency: Secondary | ICD-10-CM | POA: Diagnosis not present

## 2014-10-17 DIAGNOSIS — I352 Nonrheumatic aortic (valve) stenosis with insufficiency: Secondary | ICD-10-CM | POA: Insufficient documentation

## 2014-10-17 DIAGNOSIS — I4891 Unspecified atrial fibrillation: Secondary | ICD-10-CM | POA: Diagnosis present

## 2014-10-17 DIAGNOSIS — I480 Paroxysmal atrial fibrillation: Secondary | ICD-10-CM | POA: Diagnosis not present

## 2014-10-17 DIAGNOSIS — E780 Pure hypercholesterolemia: Secondary | ICD-10-CM | POA: Insufficient documentation

## 2014-11-07 ENCOUNTER — Encounter (HOSPITAL_COMMUNITY): Payer: Self-pay | Admitting: Nurse Practitioner

## 2014-11-07 ENCOUNTER — Ambulatory Visit (HOSPITAL_COMMUNITY)
Admission: RE | Admit: 2014-11-07 | Discharge: 2014-11-07 | Disposition: A | Discharge status: Home / Self Care | Payer: Medicare Other | Source: Ambulatory Visit | Attending: Nurse Practitioner | Admitting: Nurse Practitioner

## 2014-11-07 VITALS — BP 142/82 | HR 62 | Ht 62.0 in | Wt 126.6 lb

## 2014-11-07 DIAGNOSIS — I481 Persistent atrial fibrillation: Secondary | ICD-10-CM | POA: Diagnosis present

## 2014-11-07 DIAGNOSIS — I4819 Other persistent atrial fibrillation: Secondary | ICD-10-CM

## 2014-11-07 LAB — BASIC METABOLIC PANEL
Anion gap: 6 (ref 5–15)
BUN: 14 mg/dL (ref 6–20)
CHLORIDE: 109 mmol/L (ref 101–111)
CO2: 27 mmol/L (ref 22–32)
CREATININE: 0.89 mg/dL (ref 0.44–1.00)
Calcium: 9.4 mg/dL (ref 8.9–10.3)
GFR calc Af Amer: >60 mL/min (ref >60)
GFR calc non Af Amer: 58 mL/min — ABNORMAL LOW (ref >60)
GLUCOSE: 90 mg/dL (ref 65–99)
Potassium: 4.5 mmol/L (ref 3.5–5.1)
SODIUM: 142 mmol/L (ref 135–145)

## 2014-11-07 LAB — MAGNESIUM: Magnesium: 2.1 mg/dL (ref 1.7–2.4)

## 2014-11-07 NOTE — Patient Instructions (Signed)
Parking code for October 0900

## 2014-11-07 NOTE — Progress Notes (Signed)
Patient ID: [name] [name], female   DOB: [birth year], 81 y.o.   MRN: [id number]     Primary Care Physician: Raelene Bott, MD Referring Physician: Dr. Sabino Donovan is a 81 y.o. female with a h/o persistent afib on tikosyn. She has been on tikosyn, loaded in the [facility] system, for years but feels for the last month, it has not worked as well for her having more breakthrough afib. She has been having more physical stress with bladder incontinence issues,sees a specialist in early October, and also had a scam phone call early August with the caller saying her granddaughter was in jail and required a payment in [place] cards to get her out of jail.. When she couldn't get the proper amount of cards that were requested, she went to the jail to pay directly and then found out it was a scam. This continues to work on her nerves that she was almost out of $4500.00. She however is not quite ready to get off tikosyn. When she saw Dr. Rayann Heman one month ago, amiodarone was mentioned as an alternative to [place]. Ekg shows SR today. Recent echo showed mild to mod stenosis,mod MR, normal EF.  Today, she denies symptoms of palpitations, chest pain, shortness of breath, orthopnea, PND, lower extremity edema, dizziness, presyncope, syncope, or neurologic sequela. The patient is tolerating medications without difficulties and is otherwise without complaint today.   Past Medical History  Diagnosis Date  . Atrial fibrillation     persistent, coumadin per PCP;  echo 06/06/09: EF 60%, mild LVH, Gr 1 diast dysfx, AV gradient mean 9, mod MR, mod LAE;  Myoview 11/09: normal, EF 81%  . TIA (transient ischemic attack)   . CVA (cerebral vascular accident)     2006  . HTN (hypertension)   . Fatigue   . Dyspnea   . Giant cell arteritis     sees dr. at [facility]  . Back pain   . H/O: hysterectomy   . Subclavian artery stenosis, right     dopplers done 2/12;  carotids 2/12: 0-39% bilateral  . Hypercholesteremia    Secondary to vascular disease   Past Surgical History  Procedure Laterality Date  . Knee surgery  3/06    Left  . Appendectomy    . Abdominal hysterectomy    . Prolapsed uterine fibroid ligation  7/05    Current Outpatient Prescriptions  Medication Sig Dispense Refill  . ALPRAZolam (XANAX) 0.5 MG tablet Take 1 tablet by mouth 2 (two) times daily as needed. Sleep  1  . amLODipine (NORVASC) 5 MG tablet Take 5 mg by mouth daily.  11  . enalapril (VASOTEC) 20 MG tablet Take 20 mg by mouth 2 (two) times daily.     Marland Kitchen HYDROcodone-acetaminophen (NORCO/VICODIN) 5-325 MG per tablet Take 1 tablet by mouth every 8 (eight) hours as needed. pain    . Magnesium 400 MG CAPS Take 1 capsule by mouth daily.    . metoprolol (TOPROL-XL) 200 MG 24 hr tablet Take 1/2  tablet by mouth in the morning and take 1 tablet by mouth in the afternoon  6  . TIKOSYN 250 MCG capsule Take 1 capsule by mouth every 12 (twelve) hours.  2  . warfarin (COUMADIN) 5 MG tablet Take 5 mg by mouth as directed.      Marland Kitchen atorvastatin (LIPITOR) 20 MG tablet Take 0.5-1 tablets by mouth daily.  11  . azelastine (ASTELIN) 0.1 % nasal spray Place 2 sprays into both  nostrils 2 (two) times daily as needed for rhinitis or allergies. Use in each nostril as directed    . nitroGLYCERIN (NITROSTAT) 0.4 MG SL tablet Place 1 tablet (0.4 mg total) under the tongue every 5 (five) minutes as needed for chest pain. 25 tablet 3   No current facility-administered medications for this encounter.    Allergies  Allergen Reactions  . Ciprofloxacin Other (See Comments)    tachycardia  . Escitalopram     "lips puffed", "tight in chest", "nasuea", "dizzy"  . Ranitidine Diarrhea    Social History   Social History  . Marital Status: Married    Spouse Name: N/A  . Number of Children: N/A  . Years of Education: N/A   Occupational History  . Not on file.   Social History Main Topics  . Smoking status: Never Smoker   . Smokeless tobacco: Not on  file  . Alcohol Use: No  . Drug Use: No  . Sexual Activity: Not on file   Other Topics Concern  . Not on file   Social History Narrative   She is happily married. Her husband was with her today. She lives between [place] and [place].  Retired Advertising account planner.    Family History  Problem Relation Age of Onset  . Hypertension      ROS- All systems are reviewed and negative except as per the HPI above  Physical Exam: Filed Vitals:   11/07/14 1420  BP: 142/82  Pulse: 62  Height: 5\' 2"  (1.575 m)  Weight: 126 lb 9.6 oz (57.425 kg)    GEN- The patient is well appearing, alert and oriented x 3 today.   Head- normocephalic, atraumatic Eyes-  Sclera clear, conjunctiva pink Ears- hearing intact Oropharynx- clear Neck- supple, no JVP Lymph- no cervical lymphadenopathy Lungs- Clear to ausculation bilaterally, normal work of breathing Heart- Regular rate and rhythm, 2/6 systolic murmur,  no rubs or gallops, PMI not laterally displaced GI- soft, NT, ND, + BS Extremities- no clubbing, cyanosis, or edema MS- no significant deformity or atrophy Skin- no rash or lesion Psych- euthymic mood, full affect Neuro- strength and sensation are intact  EKG- Normal sinus rhythm at 62 bpm, Pr int 146 ms, QRS 84 ms, QTc 460 ms.  Echo- Left ventricle: The cavity size was normal. There was mild focal basal hypertrophy of the septum. Systolic function was vigorous. The estimated ejection fraction was in the range of 65% to 70%. Wall motion was normal; there were no regional wall motion abnormalities. Features are consistent with a pseudonormal left ventricular filling pattern, with concomitant abnormal relaxation and increased filling pressure (grade 2 diastolic dysfunction). Doppler parameters are consistent with elevated ventricular end-diastolic filling pressure. - Aortic valve: Trileaflet; moderately thickened, moderately calcified leaflets. Valve mobility was restricted.  There was mild to moderate stenosis. There was mild to moderate regurgitation. Mean gradient (S): 21 mm Hg. - Aortic root: The aortic root was normal in size. - Mitral valve: Calcified annulus. Moderately thickened, mildly calcified leaflets . There was moderate regurgitation directed centrally and posteriorly. - Left atrium: The atrium was severely dilated. - Right ventricle: The cavity size was normal. Wall thickness was normal. Systolic function was normal. - Right atrium: The atrium was mildly dilated. - Tricuspid valve: There was mild regurgitation. - Pulmonary arteries: Systolic pressure was at the upper limits of normal. PA peak pressure: 35 mm Hg (S). - Inferior vena cava: The vessel was normal in size. - Pericardium, extracardiac: There was no pericardial  effusion  Assessment and Plan: 1.persistent afib In SR today but has had morebreakthrough afib in August Pt is not ready  to try another antiarrythmic Bmet/mag today continue warfarin for chadsvasc of at least 7.  F/u in one month to further discuss afib burden.  Geroge Baseman Danice Dippolito, [facility] [address] [place], [place] [address] [phone]

## 2014-12-13 ENCOUNTER — Encounter (HOSPITAL_COMMUNITY): Payer: Self-pay | Admitting: Nurse Practitioner

## 2014-12-13 ENCOUNTER — Ambulatory Visit (HOSPITAL_COMMUNITY)
Admission: RE | Admit: 2014-12-13 | Discharge: 2014-12-13 | Disposition: A | Discharge status: Home / Self Care | Payer: Medicare Other | Source: Ambulatory Visit | Attending: Nurse Practitioner | Admitting: Nurse Practitioner

## 2014-12-13 VITALS — BP 140/88 | HR 62 | Ht 62.0 in | Wt 128.0 lb

## 2014-12-13 DIAGNOSIS — Z8673 Personal history of transient ischemic attack (TIA), and cerebral infarction without residual deficits: Secondary | ICD-10-CM | POA: Insufficient documentation

## 2014-12-13 DIAGNOSIS — Z7901 Long term (current) use of anticoagulants: Secondary | ICD-10-CM | POA: Diagnosis not present

## 2014-12-13 DIAGNOSIS — I4891 Unspecified atrial fibrillation: Secondary | ICD-10-CM | POA: Insufficient documentation

## 2014-12-13 DIAGNOSIS — E7800 Pure hypercholesterolemia, unspecified: Secondary | ICD-10-CM | POA: Diagnosis not present

## 2014-12-13 DIAGNOSIS — I481 Persistent atrial fibrillation: Secondary | ICD-10-CM | POA: Diagnosis not present

## 2014-12-13 DIAGNOSIS — I4819 Other persistent atrial fibrillation: Secondary | ICD-10-CM

## 2014-12-13 DIAGNOSIS — I10 Essential (primary) hypertension: Secondary | ICD-10-CM | POA: Diagnosis not present

## 2014-12-13 DIAGNOSIS — Z79899 Other long term (current) drug therapy: Secondary | ICD-10-CM | POA: Diagnosis not present

## 2014-12-13 DIAGNOSIS — Z8249 Family history of ischemic heart disease and other diseases of the circulatory system: Secondary | ICD-10-CM | POA: Insufficient documentation

## 2014-12-13 NOTE — Progress Notes (Signed)
Patient ID: [name] [name], female   DOB: [birth year], 81 y.o.   MRN: [id number]     Primary Care Physician: Raelene Bott, MD Referring Physician: Dr. Sabino Donovan is a 81 y.o. female with a h/o persistent afib that is in afib clinic for f/u. I saw her about one month ago for frequent episodes of afib that occurred about the time that she almost  was a victim  of a scam. She has has no further episodes. Feels well today.  Today, she denies symptoms of palpitations, chest pain, shortness of breath, orthopnea, PND, lower extremity edema, dizziness, presyncope, syncope, or neurologic sequela. The patient is tolerating medications without difficulties and is otherwise without complaint today.   Past Medical History  Diagnosis Date  . Atrial fibrillation (HCC)     persistent, coumadin per PCP;  echo 06/06/09: EF 60%, mild LVH, Gr 1 diast dysfx, AV gradient mean 9, mod MR, mod LAE;  Myoview 11/09: normal, EF 81%  . TIA (transient ischemic attack)   . CVA (cerebral vascular accident) ([place])     2006  . HTN (hypertension)   . Fatigue   . Dyspnea   . Giant cell arteritis [facility])     sees dr. at [facility]  . Back pain   . H/O: hysterectomy   . Subclavian artery stenosis, right     dopplers done 2/12;  carotids 2/12: 0-39% bilateral  . Hypercholesteremia     Secondary to vascular disease   Past Surgical History  Procedure Laterality Date  . Knee surgery  3/06    Left  . Appendectomy    . Abdominal hysterectomy    . Prolapsed uterine fibroid ligation  7/05    Current Outpatient Prescriptions  Medication Sig Dispense Refill  . ALPRAZolam (XANAX) 0.5 MG tablet Take 1 tablet by mouth 2 (two) times daily as needed. Sleep  1  . amLODipine (NORVASC) 5 MG tablet Take 5 mg by mouth daily.  11  . atorvastatin (LIPITOR) 20 MG tablet Take 0.5-1 tablets by mouth daily.  11  . azelastine (ASTELIN) 0.1 % nasal spray Place 2 sprays into both nostrils 2 (two) times daily as needed for rhinitis or  allergies. Use in each nostril as directed    . enalapril (VASOTEC) 20 MG tablet Take 20 mg by mouth 2 (two) times daily.     Marland Kitchen HYDROcodone-acetaminophen (NORCO/VICODIN) 5-325 MG per tablet Take 1 tablet by mouth every 8 (eight) hours as needed. pain    . Magnesium 400 MG CAPS Take 1 capsule by mouth daily.    . metoprolol (TOPROL-XL) 200 MG 24 hr tablet Take 1/2  tablet by mouth in the morning and take 1 tablet by mouth in the afternoon  6  . TIKOSYN 250 MCG capsule Take 1 capsule by mouth every 12 (twelve) hours.  2  . warfarin (COUMADIN) 5 MG tablet Take 5 mg by mouth as directed.      . nitroGLYCERIN (NITROSTAT) 0.4 MG SL tablet Place 1 tablet (0.4 mg total) under the tongue every 5 (five) minutes as needed for chest pain. 25 tablet 3   No current facility-administered medications for this encounter.    Allergies  Allergen Reactions  . Ciprofloxacin Other (See Comments)    tachycardia  . Escitalopram     "lips puffed", "tight in chest", "nasuea", "dizzy"  . Ranitidine Diarrhea    Social History   Social History  . Marital Status: Married    Spouse Name: N/A  .  Number of Children: N/A  . Years of Education: N/A   Occupational History  . Not on file.   Social History Main Topics  . Smoking status: Never Smoker   . Smokeless tobacco: Not on file  . Alcohol Use: No  . Drug Use: No  . Sexual Activity: Not on file   Other Topics Concern  . Not on file   Social History Narrative   She is happily married. Her husband was with her today. She lives between [place] and [place].  Retired Advertising account planner.    Family History  Problem Relation Age of Onset  . Hypertension      ROS- All systems are reviewed and negative except as per the HPI above  Physical Exam: Filed Vitals:   12/13/14 1407  BP: 140/88  Pulse: 62  Height: 5\' 2"  (1.575 m)  Weight: 128 lb (58.06 kg)    GEN- The patient is well appearing, alert and oriented x 3 today.   Head- normocephalic,  atraumatic Eyes-  Sclera clear, conjunctiva pink Ears- hearing intact Oropharynx- clear Neck- supple, no JVP Lymph- no cervical lymphadenopathy Lungs- Clear to ausculation bilaterally, normal work of breathing Heart- Regular rate and rhythm, no murmurs, rubs or gallops, PMI not laterally displaced GI- soft, NT, ND, + BS Extremities- no clubbing, cyanosis, or edema MS- no significant deformity or atrophy Skin- no rash or lesion Psych- euthymic mood, full affect Neuro- strength and sensation are intact  EKG-NSR, Pr int 138 ms, QRS 80 ms, QTc 468 ms Epic records reveiwed  Assessment and Plan: 1. afib In SR on tikosyn Continue warfarin  2. HTN  Stable  F/u in afib clinic in 3 months   Butch Penny C. Hernandez [place], [facility] [address] [place], [place] [address] [phone]

## 2014-12-13 NOTE — Patient Instructions (Signed)
   Billing department at [facility]  - [phone]

## 2015-03-15 ENCOUNTER — Telehealth (HOSPITAL_COMMUNITY): Payer: Self-pay | Admitting: *Deleted

## 2015-03-15 NOTE — Telephone Encounter (Signed)
Pt called in stating she has been in AFib since Friday and not sure if she needs to be seen for her scheduled appt on Tuesday. States HR is 100-110s but she was concerned her diastolic BP was running [id number] where its usually in the 70s. States her machine has difficulty with accuracy of BP when in afib.  States she has taken extra toprol this morning and is taking PRN diltiazem  - encouraged her to continue using prn cardizem every 4 hours as long as HR over 100. Patient states no symptoms other than some shortness of breath with exertion. Offered pt appt today but states she sees her PCP tomorrow and will see him and just come on Tuesday since its ok for her to continue using Cardizem. Instructed pt to call back if further issues and needs to be seen before Tuesday - pt verbalized understanding.

## 2015-03-21 ENCOUNTER — Ambulatory Visit (HOSPITAL_COMMUNITY)
Admission: RE | Admit: 2015-03-21 | Discharge: 2015-03-21 | Disposition: A | Discharge status: Home / Self Care | Payer: Medicare Other | Source: Ambulatory Visit | Attending: Nurse Practitioner | Admitting: Nurse Practitioner

## 2015-03-21 ENCOUNTER — Encounter (HOSPITAL_COMMUNITY): Payer: Self-pay | Admitting: Nurse Practitioner

## 2015-03-21 VITALS — BP 146/84 | HR 63 | Ht 62.0 in | Wt 135.0 lb

## 2015-03-21 DIAGNOSIS — I481 Persistent atrial fibrillation: Secondary | ICD-10-CM | POA: Diagnosis not present

## 2015-03-21 DIAGNOSIS — I4891 Unspecified atrial fibrillation: Secondary | ICD-10-CM | POA: Insufficient documentation

## 2015-03-21 DIAGNOSIS — I10 Essential (primary) hypertension: Secondary | ICD-10-CM | POA: Insufficient documentation

## 2015-03-21 DIAGNOSIS — I4819 Other persistent atrial fibrillation: Secondary | ICD-10-CM

## 2015-03-21 LAB — BASIC METABOLIC PANEL
Anion gap: 9 (ref 5–15)
BUN: 18 mg/dL (ref 6–20)
CALCIUM: 9.2 mg/dL (ref 8.9–10.3)
CO2: 27 mmol/L (ref 22–32)
CREATININE: 0.92 mg/dL (ref 0.44–1.00)
Chloride: 103 mmol/L (ref 101–111)
GFR calc Af Amer: >60 mL/min (ref >60)
GFR, EST NON AFRICAN AMERICAN: 56 mL/min — AB (ref >60)
Glucose, Bld: 187 mg/dL — ABNORMAL HIGH (ref 65–99)
POTASSIUM: 4.2 mmol/L (ref 3.5–5.1)
SODIUM: 139 mmol/L (ref 135–145)

## 2015-03-21 LAB — MAGNESIUM: MAGNESIUM: 2.2 mg/dL (ref 1.7–2.4)

## 2015-03-21 NOTE — Progress Notes (Signed)
Patient ID: [name] [name], female   DOB: 1929/11/13, 82 y.o.   MRN: [id number]     Primary Care Physician: Raelene Bott, MD Referring Physician: Dr. Sabino Donovan is a 82 y.o. female with a h/o persistent afib on tikosyn. She reports that her brand tikosyn was substituted for generic afib and within a few days she went into afib and stayed in afib x 5 days. It was only after being on brand tikosyn  For 2 days she returned  to NSR, and has requested to stay on brand tikosyn. She continues on warfarin.  Today, she denies symptoms of palpitations, chest pain, shortness of breath, orthopnea, PND, lower extremity edema, dizziness, presyncope, syncope, or neurologic sequela. The patient is tolerating medications without difficulties and is otherwise without complaint today.   Past Medical History  Diagnosis Date  . Atrial fibrillation (HCC)     persistent, coumadin per PCP;  echo 06/06/09: EF 60%, mild LVH, Gr 1 diast dysfx, AV gradient mean 9, mod MR, mod LAE;  Myoview 11/09: normal, EF 81%  . TIA (transient ischemic attack)   . CVA (cerebral vascular accident) ([place])     2006  . HTN (hypertension)   . Fatigue   . Dyspnea   . Giant cell arteritis [facility])     sees dr. at [facility]  . Back pain   . H/O: hysterectomy   . Subclavian artery stenosis, right     dopplers done 2/12;  carotids 2/12: 0-39% bilateral  . Hypercholesteremia     Secondary to vascular disease   Past Surgical History  Procedure Laterality Date  . Knee surgery  3/06    Left  . Appendectomy    . Abdominal hysterectomy    . Prolapsed uterine fibroid ligation  7/05    Current Outpatient Prescriptions  Medication Sig Dispense Refill  . ALPRAZolam (XANAX) 0.5 MG tablet Take 1 tablet by mouth 2 (two) times daily as needed. Sleep  1  . amLODipine (NORVASC) 5 MG tablet Take 5 mg by mouth daily.  11  . atorvastatin (LIPITOR) 20 MG tablet Take 0.5-1 tablets by mouth daily.  11  . azelastine (ASTELIN) 0.1 % nasal spray  Place 2 sprays into both nostrils 2 (two) times daily as needed for rhinitis or allergies. Use in each nostril as directed    . enalapril (VASOTEC) 20 MG tablet Take 20 mg by mouth 2 (two) times daily.     Marland Kitchen HYDROcodone-acetaminophen (NORCO/VICODIN) 5-325 MG per tablet Take 1 tablet by mouth every 8 (eight) hours as needed. pain    . Magnesium 400 MG CAPS Take 1 capsule by mouth daily.    . metoprolol (TOPROL-XL) 200 MG 24 hr tablet Take 1/2  tablet by mouth in the morning and take 1 tablet by mouth in the afternoon  6  . nitrofurantoin (MACRODANTIN) 100 MG capsule Take 100 mg by mouth daily.    Marland Kitchen TIKOSYN 250 MCG capsule Take 1 capsule by mouth every 12 (twelve) hours.  2  . warfarin (COUMADIN) 5 MG tablet Take 5 mg by mouth as directed.      . nitroGLYCERIN (NITROSTAT) 0.4 MG SL tablet Place 1 tablet (0.4 mg total) under the tongue every 5 (five) minutes as needed for chest pain. 25 tablet 3   No current facility-administered medications for this encounter.    Allergies  Allergen Reactions  . Ciprofloxacin Other (See Comments)    tachycardia  . Escitalopram     "lips puffed", "  tight in chest", "nasuea", "dizzy"  . Ranitidine Diarrhea    Social History   Social History  . Marital Status: Married    Spouse Name: N/A  . Number of Children: N/A  . Years of Education: N/A   Occupational History  . Not on file.   Social History Main Topics  . Smoking status: Never Smoker   . Smokeless tobacco: Not on file  . Alcohol Use: No  . Drug Use: No  . Sexual Activity: Not on file   Other Topics Concern  . Not on file   Social History Narrative   She is happily married. Her husband was with her today. She lives between [place] and [place].  Retired Advertising account planner.    Family History  Problem Relation Age of Onset  . Hypertension      ROS- All systems are reviewed and negative except as per the HPI above  Physical Exam: Filed Vitals:   03/21/15 1415  BP: 146/84  Pulse: 63    Height: 5\' 2"  (1.575 m)  Weight: 135 lb (61.236 kg)    GEN- The patient is well appearing, alert and oriented x 3 today.   Head- normocephalic, atraumatic Eyes-  Sclera clear, conjunctiva pink Ears- hearing intact Oropharynx- clear Neck- supple, no JVP Lymph- no cervical lymphadenopathy Lungs- Clear to ausculation bilaterally, normal work of breathing Heart- Regular rate and rhythm, no murmurs, rubs or gallops, PMI not laterally displaced GI- soft, NT, ND, + BS Extremities- no clubbing, cyanosis, or edema MS- no significant deformity or atrophy Skin- no rash or lesion Psych- euthymic mood, full affect Neuro- strength and sensation are intact  EKG- NSR, v rate 63 bpm, pr int 158 ms, qrs int 82, qtc 468 ms. Epic records reiewed  Assessment and Plan: 1. Afib Return of SR on brand tikosyn Pt questioned if she would be an ablation candidate but with severely dilated left atrium and advanced age, I doubt she would have a good outcome and may be put at risk from procedure. Continue warfarin bmet/mag  2. HTN  Stable  F/u 3 months  Geroge Baseman. [place], [facility] [address] [place], [place] [address] [phone]

## 2015-04-10 ENCOUNTER — Telehealth (HOSPITAL_COMMUNITY): Payer: Self-pay | Admitting: *Deleted

## 2015-04-10 NOTE — Telephone Encounter (Signed)
Pt tikosyn approved for 1 year.

## 2015-05-02 ENCOUNTER — Other Ambulatory Visit (HOSPITAL_COMMUNITY): Payer: Self-pay | Admitting: *Deleted

## 2015-05-02 ENCOUNTER — Telehealth (HOSPITAL_COMMUNITY): Payer: Self-pay | Admitting: *Deleted

## 2015-05-02 MED ORDER — TIKOSYN 250 MCG PO CAPS: 250.0000 ug | ORAL_CAPSULE | Freq: Two times a day (BID) | ORAL | Status: DC

## 2015-05-02 NOTE — Telephone Encounter (Signed)
Pt called in stating her tikosyn was approved through her insurance company for 1 year but she still cannot afford the copay (over 200 a month) and wanted to know what she should do. Offered to fill out patient assistance form for patient if not would have to come in for office visit to discuss medication change.

## 2015-05-18 ENCOUNTER — Telehealth: Payer: Self-pay | Admitting: *Deleted

## 2015-05-18 NOTE — Telephone Encounter (Signed)
Called patient to inform her that the [place] had arrived at the office from [employer].

## 2015-06-16 ENCOUNTER — Encounter (HOSPITAL_COMMUNITY): Payer: Self-pay | Admitting: *Deleted

## 2015-06-16 ENCOUNTER — Emergency Department (HOSPITAL_COMMUNITY): Payer: Medicare Other

## 2015-06-16 ENCOUNTER — Observation Stay (HOSPITAL_COMMUNITY)
Admission: EM | Admit: 2015-06-16 | Discharge: 2015-06-18 | Disposition: A | Discharge status: Home / Self Care | Payer: Medicare Other | Attending: Family Medicine | Admitting: Family Medicine

## 2015-06-16 DIAGNOSIS — Z66 Do not resuscitate: Secondary | ICD-10-CM | POA: Diagnosis not present

## 2015-06-16 DIAGNOSIS — I4892 Unspecified atrial flutter: Secondary | ICD-10-CM | POA: Diagnosis not present

## 2015-06-16 DIAGNOSIS — Z79899 Other long term (current) drug therapy: Secondary | ICD-10-CM | POA: Diagnosis not present

## 2015-06-16 DIAGNOSIS — I10 Essential (primary) hypertension: Secondary | ICD-10-CM | POA: Diagnosis not present

## 2015-06-16 DIAGNOSIS — E7800 Pure hypercholesterolemia, unspecified: Secondary | ICD-10-CM | POA: Diagnosis not present

## 2015-06-16 DIAGNOSIS — I472 Ventricular tachycardia: Secondary | ICD-10-CM

## 2015-06-16 DIAGNOSIS — Z7901 Long term (current) use of anticoagulants: Secondary | ICD-10-CM | POA: Insufficient documentation

## 2015-06-16 DIAGNOSIS — Z7982 Long term (current) use of aspirin: Secondary | ICD-10-CM | POA: Insufficient documentation

## 2015-06-16 DIAGNOSIS — I4891 Unspecified atrial fibrillation: Principal | ICD-10-CM | POA: Insufficient documentation

## 2015-06-16 DIAGNOSIS — R55 Syncope and collapse: Secondary | ICD-10-CM | POA: Diagnosis present

## 2015-06-16 DIAGNOSIS — M7989 Other specified soft tissue disorders: Secondary | ICD-10-CM

## 2015-06-16 DIAGNOSIS — I499 Cardiac arrhythmia, unspecified: Secondary | ICD-10-CM | POA: Diagnosis present

## 2015-06-16 DIAGNOSIS — I4729 Other ventricular tachycardia: Secondary | ICD-10-CM

## 2015-06-16 DIAGNOSIS — R002 Palpitations: Secondary | ICD-10-CM | POA: Diagnosis present

## 2015-06-16 DIAGNOSIS — M316 Other giant cell arteritis: Secondary | ICD-10-CM | POA: Insufficient documentation

## 2015-06-16 DIAGNOSIS — Z8673 Personal history of transient ischemic attack (TIA), and cerebral infarction without residual deficits: Secondary | ICD-10-CM | POA: Diagnosis not present

## 2015-06-16 DIAGNOSIS — I480 Paroxysmal atrial fibrillation: Secondary | ICD-10-CM

## 2015-06-16 HISTORY — DX: Malignant (primary) neoplasm, unspecified: C80.1

## 2015-06-16 LAB — I-STAT TROPONIN, ED: Troponin i, poc: 0 ng/mL (ref 0.00–0.08)

## 2015-06-16 LAB — CBC
HEMATOCRIT: 44.1 % (ref 36.0–46.0)
HEMOGLOBIN: 14.5 g/dL (ref 12.0–15.0)
MCH: 25.4 pg — ABNORMAL LOW (ref 26.0–34.0)
MCHC: 32.9 g/dL (ref 30.0–36.0)
MCV: 77.4 fL — AB (ref 78.0–100.0)
Platelets: 211 10*3/uL (ref 150–400)
RBC: 5.7 MIL/uL — AB (ref 3.87–5.11)
RDW: 14.9 % (ref 11.5–15.5)
WBC: 7.7 10*3/uL (ref 4.0–10.5)

## 2015-06-16 LAB — URINALYSIS, ROUTINE W REFLEX MICROSCOPIC
Bilirubin Urine: NEGATIVE
GLUCOSE, UA: NEGATIVE mg/dL
Hgb urine dipstick: NEGATIVE
KETONES UR: NEGATIVE mg/dL
NITRITE: NEGATIVE
PROTEIN: NEGATIVE mg/dL
Specific Gravity, Urine: 1.006 (ref 1.005–1.030)
pH: 7.5 (ref 5.0–8.0)

## 2015-06-16 LAB — BASIC METABOLIC PANEL
ANION GAP: 13 (ref 5–15)
BUN: 14 mg/dL (ref 6–20)
CALCIUM: 9.5 mg/dL (ref 8.9–10.3)
CHLORIDE: 102 mmol/L (ref 101–111)
CO2: 23 mmol/L (ref 22–32)
CREATININE: 0.98 mg/dL (ref 0.44–1.00)
GFR calc non Af Amer: 52 mL/min — ABNORMAL LOW (ref >60)
GFR, EST AFRICAN AMERICAN: 60 mL/min — AB (ref >60)
Glucose, Bld: 148 mg/dL — ABNORMAL HIGH (ref 65–99)
Potassium: 4.8 mmol/L (ref 3.5–5.1)
SODIUM: 138 mmol/L (ref 135–145)

## 2015-06-16 LAB — URINE MICROSCOPIC-ADD ON

## 2015-06-16 LAB — PROTIME-INR
INR: 1.88 — AB (ref 0.00–1.49)
PROTHROMBIN TIME: 21.6 s — AB (ref 11.6–15.2)

## 2015-06-16 LAB — TROPONIN I: Troponin I: <0.03 ng/mL (ref <0.031)

## 2015-06-16 LAB — PHOSPHORUS: Phosphorus: 3.2 mg/dL (ref 2.5–4.6)

## 2015-06-16 LAB — MAGNESIUM: Magnesium: 2.1 mg/dL (ref 1.7–2.4)

## 2015-06-16 NOTE — ED Notes (Signed)
Patient concerned about taking her 9pm meds.

## 2015-06-16 NOTE — ED Notes (Signed)
Patient placed on the monitor.  States she needs to take her BP med and her heart med tonight

## 2015-06-16 NOTE — ED Notes (Signed)
The pt is c/o an irregular heart beat for one week.  Hx of af.. She reports that she has been in af for 6 years.  It was controlled until feb 7th when she developed shingles.  She sometimes has chest heaviness she took a sl nitro yesterday and today.  She feels nervous  And jittery she denies pain anywhere  Rapid respirations

## 2015-06-16 NOTE — ED Notes (Signed)
Dr Viviana Simpler stated patient could take her night time meds.

## 2015-06-16 NOTE — ED Provider Notes (Signed)
CSN: [id number]     Arrival date & time 06/16/15  1840 History   First MD Initiated Contact with Patient 06/16/15 1940     Chief Complaint  Patient presents with  . Irregular Heart Beat     (Consider location/radiation/quality/duration/timing/severity/associated sxs/prior Treatment) HPI 81 year old female who presents with irregular heart beat.History of atrial fibrillation on tikosyn and warfarin. Followed by Dr. Rayann Heman and Dr. Caprice Red from cardiology. States that she has been occasionally in and out of atrial fibrillation since February, but has had more prolonged spells of atrial fibrillation over the past 2 days. Feels a funny sensation in her chest but does not have chest pain. There is very lightheaded and short of breath over the past 2 days with these episodes. No syncope or recent illnesses. Denies any fevers, nausea or vomiting, diarrhea, urinary complaints, cough. No lower extremity edema, orthopnea, or PND.    Past Medical History  Diagnosis Date  . Atrial fibrillation (HCC)     persistent, coumadin per PCP;  echo 06/06/09: EF 60%, mild LVH, Gr 1 diast dysfx, AV gradient mean 9, mod MR, mod LAE;  Myoview 11/09: normal, EF 81%  . TIA (transient ischemic attack)   . CVA (cerebral vascular accident) ([place])     2006  . HTN (hypertension)   . Fatigue   . Dyspnea   . Giant cell arteritis [facility])     sees dr. at [facility]  . Back pain   . H/O: hysterectomy   . Subclavian artery stenosis, right     dopplers done 2/12;  carotids 2/12: 0-39% bilateral  . Hypercholesteremia     Secondary to vascular disease   Past Surgical History  Procedure Laterality Date  . Knee surgery  3/06    Left  . Appendectomy    . Abdominal hysterectomy    . Prolapsed uterine fibroid ligation  7/05   Family History  Problem Relation Age of Onset  . Hypertension    . Stroke Neg Hx   . Diabetes type II Neg Hx   . Hypertension Daughter    Social History  Substance Use Topics  . Smoking status: Never  Smoker   . Smokeless tobacco: None  . Alcohol Use: No   OB History    No data available     Review of Systems 10/14 systems reviewed and are negative other than those stated in the HPI    Allergies  Ciprofloxacin; Escitalopram; and Ranitidine  Home Medications   Prior to Admission medications   Medication Sig Start Date End Date Taking? Authorizing Provider  ALPRAZolam Duanne Moron) 0.5 MG tablet Take 1 tablet by mouth 2 (two) times daily as needed. Sleep 10/06/14  Yes Historical Provider, MD  amLODipine (NORVASC) 5 MG tablet Take 5 mg by mouth daily as needed. For high blood pressure 07/27/14  Yes Historical Provider, MD  aspirin 81 MG tablet Take 81 mg by mouth daily.   Yes Historical Provider, MD  atorvastatin (LIPITOR) 20 MG tablet Take 0.5-1 tablets by mouth daily. 10/08/14  Yes Historical Provider, MD  azelastine (ASTELIN) 0.1 % nasal spray Place 2 sprays into both nostrils 2 (two) times daily as needed for rhinitis or allergies. Use in each nostril as directed   Yes Historical Provider, MD  diltiazem (CARDIZEM) 30 MG tablet Take 30 mg by mouth every 6 (six) hours as needed. For hart racing   Yes Historical Provider, MD  enalapril (VASOTEC) 20 MG tablet Take 20 mg by mouth 2 (two) times  daily.    Yes Historical Provider, MD  HYDROcodone-acetaminophen (NORCO/VICODIN) 5-325 MG per tablet Take 1 tablet by mouth every 8 (eight) hours as needed. pain 09/16/14  Yes Historical Provider, MD  Magnesium 400 MG CAPS Take 1 capsule by mouth daily.   Yes Historical Provider, MD  metoprolol (TOPROL-XL) 200 MG 24 hr tablet Take 200 mg by mouth See admin instructions. Take 1/2  tablet by mouth in the morning and take 1 tablet by mouth in the afternoon 10/08/14  Yes Historical Provider, MD  naphazoline-pheniramine (NAPHCON-A) 0.025-0.3 % ophthalmic solution Place 1 drop into both eyes daily as needed for irritation.   Yes Historical Provider, MD  nitrofurantoin (MACRODANTIN) 100 MG capsule Take 100 mg by  mouth daily.   Yes Historical Provider, MD  TIKOSYN 250 MCG capsule Take 1 capsule (250 mcg total) by mouth every 12 (twelve) hours. 05/02/15  Yes Sherran Needs, NP  warfarin (COUMADIN) 5 MG tablet Take 2.5-5 mg by mouth as directed. Take 2.5mg  mon wed fridays and sundays whole tablet rest of days   Yes Historical Provider, MD  zolpidem (AMBIEN) 10 MG tablet Take 5 mg by mouth at bedtime as needed for sleep.   Yes Historical Provider, MD  nitroGLYCERIN (NITROSTAT) 0.4 MG SL tablet Place 1 tablet (0.4 mg total) under the tongue every 5 (five) minutes as needed for chest pain. 11/01/10 10/10/14  Josue Hector, MD   BP 184/105 mmHg  Pulse 80  Temp(Src) 97.9 F (36.6 C)  Resp 23  Wt 126 lb 4 oz (57.267 kg)  SpO2 97% Physical Exam Physical Exam  Nursing note and vitals reviewed. Constitutional: Well developed, well nourished, non-toxic, and in no acute distress Head: Normocephalic and atraumatic.  Mouth/Throat: Oropharynx is clear and moist.  Neck: Normal range of motion. Neck supple.  Cardiovascular: Tachycardic rate and irregularly irregular rhythm.  No edema. Pulmonary/Chest: Effort normal and breath sounds normal.  Abdominal: Soft. There is no tenderness. There is no rebound and no guarding.  Musculoskeletal: Normal range of motion.  Neurological: Alert, no facial droop, fluent speech, moves all extremities symmetrically Skin: Skin is warm and dry.  Psychiatric: Cooperative  ED Course  Procedures (including critical care time) Labs Review Labs Reviewed  BASIC METABOLIC PANEL - Abnormal; Notable for the following:    Glucose, Bld 148 (*)    GFR calc non Af Amer 52 (*)    GFR calc Af Amer 60 (*)    All other components within normal limits  CBC - Abnormal; Notable for the following:    RBC 5.70 (*)    MCV 77.4 (*)    MCH 25.4 (*)    All other components within normal limits  PROTIME-INR - Abnormal; Notable for the following:    Prothrombin Time 21.6 (*)    INR 1.88 (*)    All  other components within normal limits  URINALYSIS, ROUTINE W REFLEX MICROSCOPIC (NOT AT [facility]) - Abnormal; Notable for the following:    Leukocytes, UA TRACE (*)    All other components within normal limits  URINE MICROSCOPIC-ADD ON - Abnormal; Notable for the following:    Squamous Epithelial / LPF 6-30 (*)    Bacteria, UA FEW (*)    All other components within normal limits  BRAIN NATRIURETIC PEPTIDE - Abnormal; Notable for the following:    B Natriuretic Peptide 242.4 (*)    All other components within normal limits  TROPONIN I  MAGNESIUM  PHOSPHORUS  TROPONIN I  D-DIMER, QUANTITATIVE (  NOT AT [facility])  Randolm Idol, ED    Imaging Review Dg Chest 2 View  06/16/2015  CLINICAL DATA:  Pt c/o SOB, lightheadedness and weakness x 1 day. Denies any other chest complaints. Hx a fib, TIA, CVA and HTN. EXAM: CHEST  2 VIEW COMPARISON:  03/23/2015 FINDINGS: Cardiac silhouette is mildly enlarged. No mediastinal or hilar masses or evidence of adenopathy. Lungs are clear.  No pleural effusion or pneumothorax. Bony thorax is demineralized. There is a moderate, chronic, compression fracture at the thoracolumbar junction. IMPRESSION: No acute cardiopulmonary disease. Electronically Signed   By: Lajean Manes M.D.   On: 06/16/2015 20:13   I have personally reviewed and evaluated these images and lab results as part of my medical decision-making.   EKG Interpretation   Date/Time:  Friday June 16 2015 23:45:53 EDT Ventricular Rate:  98 PR Interval:    QRS Duration: 92 QT Interval:  408 QTC Calculation: 521 R Axis:   21 Text Interpretation:  Atrial flutter Paired ventricular premature  complexes Low voltage, extremity leads Minimal ST depression,  anterolateral leads Borderline prolonged QT interval Multiple PVCs with  atrial fibrillation Normally in sinus rhythm  Confirmed by Kaitlyn Skowron MD, Hinton Dyer  [id number]) on 06/17/2015 12:56:05 AM      MDM   Final diagnoses:  Paroxysmal atrial fibrillation (HCC)   Nonsustained ventricular tachycardia ([place])  Near syncope    82 year old female who presents with shortness of breath and near syncope. On arrival she is in atrial fibrillation, that is rate controlled with a heart rate in the 90s to low 100s. She is hypertensive. She is not in any acute distress. Does not appear fluid overloaded on exam. Unremarkable cardiopulmonary exam other than presence of atrial fibrillation. No major electrolyte or metabolic derangements and troponin serially have been negative. INR is subtherapeutic, and she is not a candidate that for cardioversion. She feels that she is back at her baseline. Spoke to Dr. Adrienne Mocha who felt that given to control and lack of symptoms she would be safe for discharge home and follow-up in A. fib clinic in 2 days as scheduled. However, she is noted to have increasing frequency of PVCs and subsequent nonsustained ventricular tachycardia of 8-9 beats intermittently. She is asymptomatic during these periods, but concern for near syncopal episode at home they have suggest more serious sustained arrhythmia. I spoke to Dr. Roel Cluck who will admit to telemetry.    Forde Dandy, MD 06/17/15 [phone]

## 2015-06-17 ENCOUNTER — Encounter (HOSPITAL_COMMUNITY): Payer: Self-pay | Admitting: Internal Medicine

## 2015-06-17 ENCOUNTER — Observation Stay (HOSPITAL_BASED_OUTPATIENT_CLINIC_OR_DEPARTMENT_OTHER): Payer: Medicare Other

## 2015-06-17 ENCOUNTER — Observation Stay (HOSPITAL_COMMUNITY): Payer: Medicare Other

## 2015-06-17 DIAGNOSIS — Z7901 Long term (current) use of anticoagulants: Secondary | ICD-10-CM

## 2015-06-17 DIAGNOSIS — R55 Syncope and collapse: Secondary | ICD-10-CM

## 2015-06-17 DIAGNOSIS — I10 Essential (primary) hypertension: Secondary | ICD-10-CM

## 2015-06-17 DIAGNOSIS — I481 Persistent atrial fibrillation: Secondary | ICD-10-CM

## 2015-06-17 DIAGNOSIS — R002 Palpitations: Secondary | ICD-10-CM | POA: Diagnosis not present

## 2015-06-17 DIAGNOSIS — M7989 Other specified soft tissue disorders: Secondary | ICD-10-CM

## 2015-06-17 LAB — COMPREHENSIVE METABOLIC PANEL
ALBUMIN: 3.3 g/dL — AB (ref 3.5–5.0)
ALT: 23 U/L (ref 14–54)
ANION GAP: 11 (ref 5–15)
AST: 33 U/L (ref 15–41)
Alkaline Phosphatase: 71 U/L (ref 38–126)
BUN: 11 mg/dL (ref 6–20)
CHLORIDE: 104 mmol/L (ref 101–111)
CO2: 23 mmol/L (ref 22–32)
Calcium: 9.1 mg/dL (ref 8.9–10.3)
Creatinine, Ser: 0.82 mg/dL (ref 0.44–1.00)
GFR calc Af Amer: >60 mL/min (ref >60)
GFR calc non Af Amer: >60 mL/min (ref >60)
GLUCOSE: 166 mg/dL — AB (ref 65–99)
Potassium: 4.2 mmol/L (ref 3.5–5.1)
SODIUM: 138 mmol/L (ref 135–145)
Total Bilirubin: 1.5 mg/dL — ABNORMAL HIGH (ref 0.3–1.2)
Total Protein: 6.4 g/dL — ABNORMAL LOW (ref 6.5–8.1)

## 2015-06-17 LAB — CBC
HEMATOCRIT: 41.3 % (ref 36.0–46.0)
Hemoglobin: 13.1 g/dL (ref 12.0–15.0)
MCH: 24.5 pg — AB (ref 26.0–34.0)
MCHC: 31.7 g/dL (ref 30.0–36.0)
MCV: 77.3 fL — AB (ref 78.0–100.0)
PLATELETS: 189 10*3/uL (ref 150–400)
RBC: 5.34 MIL/uL — ABNORMAL HIGH (ref 3.87–5.11)
RDW: 14.8 % (ref 11.5–15.5)
WBC: 7.6 10*3/uL (ref 4.0–10.5)

## 2015-06-17 LAB — TROPONIN I
Troponin I: <0.03 ng/mL (ref <0.031)
Troponin I: <0.03 ng/mL (ref <0.031)

## 2015-06-17 LAB — PHOSPHORUS: PHOSPHORUS: 3.3 mg/dL (ref 2.5–4.6)

## 2015-06-17 LAB — MAGNESIUM: Magnesium: 2 mg/dL (ref 1.7–2.4)

## 2015-06-17 LAB — BRAIN NATRIURETIC PEPTIDE: B NATRIURETIC PEPTIDE 5: 242.4 pg/mL — AB (ref 0.0–100.0)

## 2015-06-17 LAB — TSH: TSH: 1.289 u[IU]/mL (ref 0.350–4.500)

## 2015-06-17 LAB — PROTIME-INR
INR: 2 — AB (ref 0.00–1.49)
Prothrombin Time: 22.6 seconds — ABNORMAL HIGH (ref 11.6–15.2)

## 2015-06-17 LAB — D-DIMER, QUANTITATIVE: D-Dimer, Quant: <0.27 ug/mL-FEU (ref 0.00–0.50)

## 2015-06-17 LAB — GLUCOSE, CAPILLARY: GLUCOSE-CAPILLARY: 154 mg/dL — AB (ref 65–99)

## 2015-06-17 MED ORDER — NITROFURANTOIN MACROCRYSTAL 100 MG PO CAPS
100.0000 mg | ORAL_CAPSULE | Freq: Every day | ORAL | Status: DC
  Administered 2015-06-17 – 2015-06-18 (×2): 100 mg via ORAL
  Filled 2015-06-17 (×2): qty 1

## 2015-06-17 MED ORDER — ENALAPRIL MALEATE 20 MG PO TABS
20.0000 mg | ORAL_TABLET | Freq: Two times a day (BID) | ORAL | Status: DC
  Administered 2015-06-17 – 2015-06-18 (×4): 20 mg via ORAL
  Filled 2015-06-17 (×3): qty 1
  Filled 2015-06-17 (×3): qty 2
  Filled 2015-06-17: qty 1
  Filled 2015-06-17: qty 2

## 2015-06-17 MED ORDER — IOPAMIDOL (ISOVUE-370) INJECTION 76%
INTRAVENOUS | Status: AC
  Filled 2015-06-17: qty 100

## 2015-06-17 MED ORDER — HYDROCODONE-ACETAMINOPHEN 5-325 MG PO TABS: 1.0000 | ORAL_TABLET | Freq: Four times a day (QID) | ORAL | Status: DC | PRN

## 2015-06-17 MED ORDER — DILTIAZEM HCL 30 MG PO TABS: 30.0000 mg | ORAL_TABLET | Freq: Four times a day (QID) | ORAL | Status: DC | PRN

## 2015-06-17 MED ORDER — WARFARIN SODIUM 7.5 MG PO TABS
7.5000 mg | ORAL_TABLET | Freq: Once | ORAL | Status: AC
  Administered 2015-06-17: 7.5 mg via ORAL
  Filled 2015-06-17: qty 1

## 2015-06-17 MED ORDER — SODIUM CHLORIDE 0.9 % IV SOLN
INTRAVENOUS | Status: AC
  Administered 2015-06-17: 03:00:00 via INTRAVENOUS

## 2015-06-17 MED ORDER — METOCLOPRAMIDE HCL 5 MG/ML IJ SOLN
5.0000 mg | Freq: Four times a day (QID) | INTRAMUSCULAR | Status: DC | PRN
  Administered 2015-06-17: 5 mg via INTRAVENOUS
  Filled 2015-06-17: qty 2

## 2015-06-17 MED ORDER — ALPRAZOLAM 0.5 MG PO TABS: 0.5000 mg | ORAL_TABLET | Freq: Two times a day (BID) | ORAL | Status: DC | PRN

## 2015-06-17 MED ORDER — METOPROLOL SUCCINATE ER 100 MG PO TB24
200.0000 mg | ORAL_TABLET | Freq: Two times a day (BID) | ORAL | Status: DC
  Administered 2015-06-17 – 2015-06-18 (×4): 200 mg via ORAL
  Filled 2015-06-17 (×5): qty 2

## 2015-06-17 MED ORDER — DOFETILIDE 250 MCG PO CAPS
250.0000 ug | ORAL_CAPSULE | Freq: Two times a day (BID) | ORAL | Status: DC
  Administered 2015-06-17 – 2015-06-18 (×3): 250 ug via ORAL
  Filled 2015-06-17 (×3): qty 1

## 2015-06-17 MED ORDER — ZOLPIDEM TARTRATE 5 MG PO TABS
5.0000 mg | ORAL_TABLET | Freq: Every evening | ORAL | Status: DC | PRN
  Administered 2015-06-17 (×2): 5 mg via ORAL
  Filled 2015-06-17 (×2): qty 1

## 2015-06-17 MED ORDER — ASPIRIN 81 MG PO CHEW
81.0000 mg | CHEWABLE_TABLET | Freq: Every day | ORAL | Status: DC
  Administered 2015-06-17 – 2015-06-18 (×2): 81 mg via ORAL
  Filled 2015-06-17 (×2): qty 1

## 2015-06-17 MED ORDER — SODIUM CHLORIDE 0.9% FLUSH
3.0000 mL | Freq: Two times a day (BID) | INTRAVENOUS | Status: DC
  Administered 2015-06-17: 10 mL via INTRAVENOUS
  Administered 2015-06-17 – 2015-06-18 (×2): 3 mL via INTRAVENOUS

## 2015-06-17 MED ORDER — ENSURE ENLIVE PO LIQD
237.0000 mL | Freq: Two times a day (BID) | ORAL | Status: DC
  Administered 2015-06-17 – 2015-06-18 (×2): 237 mL via ORAL

## 2015-06-17 MED ORDER — ATORVASTATIN CALCIUM 10 MG PO TABS
10.0000 mg | ORAL_TABLET | ORAL | Status: DC
  Administered 2015-06-18: 10 mg via ORAL
  Filled 2015-06-17: qty 1

## 2015-06-17 MED ORDER — IOPAMIDOL (ISOVUE-370) INJECTION 76%
80.0000 mL | Freq: Once | INTRAVENOUS | Status: AC | PRN
  Administered 2015-06-17: 100 mL via INTRAVENOUS

## 2015-06-17 MED ORDER — WARFARIN - PHARMACIST DOSING INPATIENT
Freq: Every day | Status: DC
  Administered 2015-06-17: 17:00:00

## 2015-06-17 MED ORDER — DILTIAZEM HCL 30 MG PO TABS
30.0000 mg | ORAL_TABLET | Freq: Four times a day (QID) | ORAL | Status: DC
Start: 2015-06-17 — End: 2015-06-18
  Administered 2015-06-17 – 2015-06-18 (×5): 30 mg via ORAL
  Filled 2015-06-17 (×5): qty 1

## 2015-06-17 MED ORDER — ATORVASTATIN CALCIUM 10 MG PO TABS
10.0000 mg | ORAL_TABLET | Freq: Every day | ORAL | Status: DC
Start: 2015-06-17 — End: 2015-06-17
  Administered 2015-06-17: 10 mg via ORAL
  Filled 2015-06-17: qty 1

## 2015-06-17 MED ORDER — ENOXAPARIN SODIUM 60 MG/0.6ML ~~LOC~~ SOLN
60.0000 mg | SUBCUTANEOUS | Status: AC
  Administered 2015-06-17: 60 mg via SUBCUTANEOUS
  Filled 2015-06-17: qty 0.6

## 2015-06-17 MED ORDER — ENOXAPARIN SODIUM 60 MG/0.6ML ~~LOC~~ SOLN: 60.0000 mg | Freq: Two times a day (BID) | SUBCUTANEOUS | Status: DC

## 2015-06-17 NOTE — Progress Notes (Signed)
Initial Nutrition Assessment  DOCUMENTATION CODES:   Not applicable  INTERVENTION:   Ensure Enlive po BID, each supplement provides 350 kcal and 20 grams of protein Reviewed high protein foods with pt.   NUTRITION DIAGNOSIS:   Inadequate oral intake related to  (nausea) as evidenced by meal completion < 50%.  GOAL:   Patient will meet greater than or equal to 90% of their needs  MONITOR:   PO intake, Supplement acceptance, I & O's, Weight trends  REASON FOR ASSESSMENT:   Malnutrition Screening Tool    ASSESSMENT:   82 year old female history of atrial fibrillation and history of CVA on chronic anticoagulation presents with palpitations and presyncope.  Labs and medications reviewed.  Pt c/o nausea in the am and poor appetite but no recent weight loss. Likes ensure.  Nutrition-Focused physical exam completed. Findings are no fat depletion, severe muscle depletion in temples, and no edema.    Diet Order:  Diet Heart Room service appropriate?: Yes; Fluid consistency:: Thin  Skin:  Reviewed, no issues  Last BM:  unknown  Height:   Ht Readings from Last 1 Encounters:  06/17/15 5\' 2"  (1.575 m)    Weight:   Wt Readings from Last 1 Encounters:  06/16/15 126 lb 4 oz (57.267 kg)    Ideal Body Weight:  50 kg  BMI:  Body mass index is 23.09 kg/(m^2).  Estimated Nutritional Needs:   Kcal:  1400-1600  Protein:  70-80 grams  Fluid:  > 1.5 L/day  EDUCATION NEEDS:   Education needs addressed  Maylon Peppers RD, [place], [place] Pager [phone] After Hours Pager

## 2015-06-17 NOTE — Progress Notes (Signed)
Attempted to call for report...ED RN will call back for report

## 2015-06-17 NOTE — Discharge Instructions (Signed)

## 2015-06-17 NOTE — Progress Notes (Addendum)
ANTICOAGULATION CONSULT NOTE - Initial Consult  Pharmacy Consult for Lovenox and Coumadin Indication: atrial fibrillation  Allergies  Allergen Reactions  . Ciprofloxacin Other (See Comments)    tachycardia  . Escitalopram     "lips puffed", "tight in chest", "nasuea", "dizzy"  . Ranitidine Diarrhea    Patient Measurements: Weight: 126 lb 4 oz (57.267 kg)  Vital Signs: Temp: 97.9 F (36.6 C) (04/07 1903) BP: 173/116 mmHg (04/08 0100) Pulse Rate: 93 (04/08 0100)  Labs:  Recent Labs  06/16/15 1855 06/16/15 2310  HGB 14.5  --   HCT 44.1  --   PLT 211  --   LABPROT 21.6*  --   INR 1.88*  --   CREATININE 0.98  --   TROPONINI <0.03 <0.03    Estimated Creatinine Clearance: 33.8 mL/min (by C-G formula based on Cr of 0.98).   Medical History: Past Medical History  Diagnosis Date  . Atrial fibrillation (HCC)     persistent, coumadin per PCP;  echo 06/06/09: EF 60%, mild LVH, Gr 1 diast dysfx, AV gradient mean 9, mod MR, mod LAE;  Myoview 11/09: normal, EF 81%  . TIA (transient ischemic attack)   . CVA (cerebral vascular accident) ([place])     2006  . HTN (hypertension)   . Fatigue   . Dyspnea   . Giant cell arteritis [facility])     sees dr. at [facility]  . Back pain   . H/O: hysterectomy   . Subclavian artery stenosis, right     dopplers done 2/12;  carotids 2/12: 0-39% bilateral  . Hypercholesteremia     Secondary to vascular disease     Assessment: 82yo female c/o chest heaviness and irregular HR, found to be in Afib, to continue Coumadin during admission for further w/u; current INR below goal (last dose Coumadin 4/6) w/ high risk given h/o CVA, to also begin LMWH bridge until therapeutic.  Goal of Therapy:  INR 2-3 Anti-Xa level 0.6-1 units/ml 4hrs after LMWH dose given Monitor platelets by anticoagulation protocol: Yes   Plan:  Will begin Lovenox 60mg  SQ Q12H and monitor CBC and SCr.  Will give boosted Coumadin dose of 7.5mg  po x1 today and monitor INR for dose  adjustments.  Wynona Neat, PharmD, BCPS  06/17/2015,1:54 AM    Addendum: INR 2.0 today which is therapeutic so will discontinue lovenox.   Nena Jordan, PharmD, BCPS 06/17/2015, 10:38 AM

## 2015-06-17 NOTE — H&P (Addendum)
PCP:  Raelene Bott, MD  Cardiology [facility]  Referring provider Liu   Chief Complaint:  Pre syncope, plapitations  HPI: [name] [name] is a 82 y.o. female   has a past medical history of Atrial fibrillation ([place]); TIA (transient ischemic attack); CVA (cerebral vascular accident) ([place]); HTN (hypertension); Fatigue; Dyspnea; Giant cell arteritis ([place]); Back pain; H/O: hysterectomy; Subclavian artery stenosis, right; and Hypercholesteremia.   Presented with  1 week history of episodes of palpitations and irregular heartbeat. She has no history of atrial fibrillation and has when necessary diltiazem for this. As well as and schedule Toprol. She's been having occasional episodes of chest heaviness and took nitroglycerin for this yesterday. No chest pain no. Today she felt lightheaded and felt like her legs swelled that I give out and she was got a pass out this was associated with palpitations her knees and both feet felt numb during this event. Once she sat down she felt better. Mrs. was associated shortness of breath. She reports recent outbreak of shingles around her eye and have not felt good ever since.  She has not had any appetite, tried to drink water. She has dry mouth. She feels lightheaded when she tries to stand up or bend over. No PND. Her left leg started to swell up she is not sure for how long.  Deneis any fever no chest pain, she gets occasional panic attack and takes xanax for this.  IN ER: Troponin negative 2 EKG showing evidence of atrial fibrillation INR 1.88 subtherapeutic. Cardiology has been consulted. That point recommended given dose of diltiazem as needed. Patient showed evidence of nonsustained V. tach but 8 beats at a time. This was asymptomatic.  Regarding pertinent past history: History of atrial fibrillation on Tikosyn and Toprol with when necessary diltiazem anticoagulated with Coumadin area and chest x-ray showing no acute cardiopulmonary disease Patient has  remote history of CVA in 2006 Hospitalist was called for admission for presyncope  Review of Systems:    Pertinent positives include: Lightheadedness dizziness, palpitations. shortness of breath at rest.  Constitutional:  No weight loss, night sweats, Fevers, chills, fatigue, weight loss  HEENT:  No headaches, Difficulty swallowing,Tooth/dental problems,Sore throat,  No sneezing, itching, ear ache, nasal congestion, post nasal drip,  Cardio-vascular:  No chest pain, Orthopnea, PND, anasarca, no Bilateral lower extremity swelling  GI:  No heartburn, indigestion, abdominal pain, nausea, vomiting, diarrhea, change in bowel habits, loss of appetite, melena, blood in stool, hematemesis Resp:  no  No dyspnea on exertion, No excess mucus, no productive cough, No non-productive cough, No coughing up of blood.No change in color of mucus.No wheezing. Skin:  no rash or lesions. No jaundice GU:  no dysuria, change in color of urine, no urgency or frequency. No straining to urinate.  No flank pain.  Musculoskeletal:  No joint pain or no joint swelling. No decreased range of motion. No back pain.  Psych:  No change in mood or affect. No depression or anxiety. No memory loss.  Neuro: no localizing neurological complaints, no tingling, no weakness, no double vision, no gait abnormality, no slurred speech, no confusion  Otherwise ROS are negative except for above, 10 systems were reviewed  Past Medical History: Past Medical History  Diagnosis Date  . Atrial fibrillation (HCC)     persistent, coumadin per PCP;  echo 06/06/09: EF 60%, mild LVH, Gr 1 diast dysfx, AV gradient mean 9, mod MR, mod LAE;  Myoview 11/09: normal, EF 81%  . TIA (transient ischemic  attack)   . CVA (cerebral vascular accident) ([place])     2006  . HTN (hypertension)   . Fatigue   . Dyspnea   . Giant cell arteritis [facility])     sees dr. at [facility]  . Back pain   . H/O: hysterectomy   . Subclavian artery stenosis, right      dopplers done 2/12;  carotids 2/12: 0-39% bilateral  . Hypercholesteremia     Secondary to vascular disease   Past Surgical History  Procedure Laterality Date  . Knee surgery  3/06    Left  . Appendectomy    . Abdominal hysterectomy    . Prolapsed uterine fibroid ligation  7/05     Medications: Prior to Admission medications   Medication Sig Start Date End Date Taking? Authorizing Provider  ALPRAZolam Duanne Moron) 0.5 MG tablet Take 1 tablet by mouth 2 (two) times daily as needed. Sleep 10/06/14  Yes Historical Provider, MD  amLODipine (NORVASC) 5 MG tablet Take 5 mg by mouth daily as needed. For high blood pressure 07/27/14  Yes Historical Provider, MD  aspirin 81 MG tablet Take 81 mg by mouth daily.   Yes Historical Provider, MD  atorvastatin (LIPITOR) 20 MG tablet Take 0.5-1 tablets by mouth daily. 10/08/14  Yes Historical Provider, MD  azelastine (ASTELIN) 0.1 % nasal spray Place 2 sprays into both nostrils 2 (two) times daily as needed for rhinitis or allergies. Use in each nostril as directed   Yes Historical Provider, MD  diltiazem (CARDIZEM) 30 MG tablet Take 30 mg by mouth every 6 (six) hours as needed. For hart racing   Yes Historical Provider, MD  enalapril (VASOTEC) 20 MG tablet Take 20 mg by mouth 2 (two) times daily.    Yes Historical Provider, MD  HYDROcodone-acetaminophen (NORCO/VICODIN) 5-325 MG per tablet Take 1 tablet by mouth every 8 (eight) hours as needed. pain 09/16/14  Yes Historical Provider, MD  Magnesium 400 MG CAPS Take 1 capsule by mouth daily.   Yes Historical Provider, MD  metoprolol (TOPROL-XL) 200 MG 24 hr tablet Take 200 mg by mouth See admin instructions. Take 1/2  tablet by mouth in the morning and take 1 tablet by mouth in the afternoon 10/08/14  Yes Historical Provider, MD  naphazoline-pheniramine (NAPHCON-A) 0.025-0.3 % ophthalmic solution Place 1 drop into both eyes daily as needed for irritation.   Yes Historical Provider, MD  nitrofurantoin (MACRODANTIN) 100  MG capsule Take 100 mg by mouth daily.   Yes Historical Provider, MD  TIKOSYN 250 MCG capsule Take 1 capsule (250 mcg total) by mouth every 12 (twelve) hours. 05/02/15  Yes Sherran Needs, NP  warfarin (COUMADIN) 5 MG tablet Take 2.5-5 mg by mouth as directed. Take 2.5mg  mon wed fridays and sundays whole tablet rest of days   Yes Historical Provider, MD  zolpidem (AMBIEN) 10 MG tablet Take 5 mg by mouth at bedtime as needed for sleep.   Yes Historical Provider, MD  nitroGLYCERIN (NITROSTAT) 0.4 MG SL tablet Place 1 tablet (0.4 mg total) under the tongue every 5 (five) minutes as needed for chest pain. 11/01/10 10/10/14  Josue Hector, MD    Allergies:   Allergies  Allergen Reactions  . Ciprofloxacin Other (See Comments)    tachycardia  . Escitalopram     "lips puffed", "tight in chest", "nasuea", "dizzy"  . Ranitidine Diarrhea    Social History:  Ambulatory  independently   Lives at home alone,  reports that she has never smoked. She does not have any smokeless tobacco history on file. She reports that she does not drink alcohol or use illicit drugs.     Family History: family history includes Hypertension in her daughter. There is no history of Stroke or Diabetes type II.    Physical Exam: Patient Vitals for the past 24 hrs:  BP Temp Pulse Resp SpO2 Weight  06/17/15 0015 (!) 184/105 mmHg - 80 23 97 % -  06/17/15 0000 (!) 164/108 mmHg - 71 21 95 % -  06/16/15 2345 (!) 157/106 mmHg - (!) 59 13 91 % -  06/16/15 2330 (!) 180/122 mmHg - 93 22 96 % -  06/16/15 2315 (!) 186/125 mmHg - 99 25 97 % -  06/16/15 2300 (!) 162/109 mmHg - 101 23 95 % -  06/16/15 2245 (!) 152/120 mmHg - 92 19 96 % -  06/16/15 2230 (!) 177/116 mmHg - 95 18 97 % -  06/16/15 2215 (!) 170/143 mmHg - 102 19 96 % -  06/16/15 2200 (!) 148/121 mmHg - (!) 51 (!) 30 98 % -  06/16/15 2145 (!) 151/113 mmHg - 72 17 94 % -  06/16/15 2130 (!) 170/115 mmHg - 81 17 97 % -  06/16/15 2115 (!) 169/127 mmHg - 93 22 97  % -  06/16/15 2100 (!) 162/102 mmHg - 102 25 97 % -  06/16/15 2045 (!) 173/103 mmHg - 96 24 97 % -  06/16/15 2030 (!) 166/114 mmHg - 99 13 98 % -  06/16/15 1903 134/62 mmHg 97.9 F (36.6 C) 108 (!) 36 99 % -  06/16/15 1857 - - - - - 57.267 kg (126 lb 4 oz)    1. General:  in No Acute distress 2. Psychological: Alert and  Oriented 3. Head/ENT:    Dry Mucous Membranes                          Head Non traumatic, neck supple                          Normal Dentition 4. SKIN:  decreased Skin turgor,  Skin clean Dry and intact no rash 5. Heart: Regular rate and rhythm systolic Murmur, Rub or gallop 6. Lungs:   no wheezes or crackles   7. Abdomen: Soft, non-tender, Non distended 8. Lower extremities: no clubbing, cyanosis, or edema 9. Neurologically Grossly intact, moving all 4 extremities equally 10. MSK: Normal range of motion  body mass index is 23.09 kg/(m^2).   Labs on Admission:   Results for orders placed or performed during the hospital encounter of 06/16/15 (from the past 24 hour(s))  Basic metabolic panel     Status: Abnormal   Collection Time: 06/16/15  6:55 PM  Result Value Ref Range   Sodium 138 135 - 145 mmol/L   Potassium 4.8 3.5 - 5.1 mmol/L   Chloride 102 101 - 111 mmol/L   CO2 23 22 - 32 mmol/L   Glucose, Bld 148 (H) 65 - 99 mg/dL   BUN 14 6 - 20 mg/dL   Creatinine, Ser 0.98 0.44 - 1.00 mg/dL   Calcium 9.5 8.9 - 10.3 mg/dL   GFR calc non Af Amer 52 (L) >60 mL/min   GFR calc Af Amer 60 (L) >60 mL/min   Anion gap 13 5 - 15  CBC     Status: Abnormal  Collection Time: 06/16/15  6:55 PM  Result Value Ref Range   WBC 7.7 4.0 - 10.5 K/uL   RBC 5.70 (H) 3.87 - 5.11 MIL/uL   Hemoglobin 14.5 12.0 - 15.0 g/dL   HCT 44.1 36.0 - 46.0 %   MCV 77.4 (L) 78.0 - 100.0 fL   MCH 25.4 (L) 26.0 - 34.0 pg   MCHC 32.9 30.0 - 36.0 g/dL   RDW 14.9 11.5 - 15.5 %   Platelets 211 150 - 400 K/uL  Troponin I     Status: None   Collection Time: 06/16/15  6:55 PM  Result Value Ref  Range   Troponin I <0.03 <0.031 ng/mL  Protime-INR (order if Patient is taking Coumadin / Warfarin)     Status: Abnormal   Collection Time: 06/16/15  6:55 PM  Result Value Ref Range   Prothrombin Time 21.6 (H) 11.6 - 15.2 seconds   INR 1.88 (H) 0.00 - 1.49  Brain natriuretic peptide     Status: Abnormal   Collection Time: 06/16/15  6:55 PM  Result Value Ref Range   B Natriuretic Peptide 242.4 (H) 0.0 - 100.0 pg/mL  Urinalysis, Routine w reflex microscopic (not at [facility])     Status: Abnormal   Collection Time: 06/16/15  7:01 PM  Result Value Ref Range   Color, Urine YELLOW YELLOW   APPearance CLEAR CLEAR   Specific Gravity, Urine 1.006 1.005 - 1.030   pH 7.5 5.0 - 8.0   Glucose, UA NEGATIVE NEGATIVE mg/dL   Hgb urine dipstick NEGATIVE NEGATIVE   Bilirubin Urine NEGATIVE NEGATIVE   Ketones, ur NEGATIVE NEGATIVE mg/dL   Protein, ur NEGATIVE NEGATIVE mg/dL   Nitrite NEGATIVE NEGATIVE   Leukocytes, UA TRACE (A) NEGATIVE  Urine microscopic-add on     Status: Abnormal   Collection Time: 06/16/15  7:01 PM  Result Value Ref Range   Squamous Epithelial / LPF 6-30 (A) NONE SEEN   WBC, UA 0-5 0 - 5 WBC/hpf   RBC / HPF 0-5 0 - 5 RBC/hpf   Bacteria, UA FEW (A) NONE SEEN  Magnesium     Status: None   Collection Time: 06/16/15  8:45 PM  Result Value Ref Range   Magnesium 2.1 1.7 - 2.4 mg/dL  Phosphorus     Status: None   Collection Time: 06/16/15  8:45 PM  Result Value Ref Range   Phosphorus 3.2 2.5 - 4.6 mg/dL  I-Stat Troponin, ED (not at [facility])     Status: None   Collection Time: 06/16/15  9:00 PM  Result Value Ref Range   Troponin i, poc 0.00 0.00 - 0.08 ng/mL   Comment 3          Troponin I     Status: None   Collection Time: 06/16/15 11:10 PM  Result Value Ref Range   Troponin I <0.03 <0.031 ng/mL    UA   no evidence of UTI  No results found for: HGBA1C  Estimated Creatinine Clearance: 33.8 mL/min (by C-G formula based on Cr of 0.98).  BNP (last 3 results) No results for  input(s): PROBNP in the last 8760 hours.  Other results:  I have pearsonaly reviewed this: ECG REPORT  Rate: 98  Rhythm: Atrial fibrillation with frequent PVCs ST&T Change: No ischemic changes QTC 528 prolonged  Filed Weights   06/16/15 1857  Weight: 57.267 kg (126 lb 4 oz)     Cultures: No results found for: SDES, [place], CULT, REPTSTATUS   Radiological Exams on  Admission: Dg Chest 2 View  06/16/2015  CLINICAL DATA:  Pt c/o SOB, lightheadedness and weakness x 1 day. Denies any other chest complaints. Hx a fib, TIA, CVA and HTN. EXAM: CHEST  2 VIEW COMPARISON:  03/23/2015 FINDINGS: Cardiac silhouette is mildly enlarged. No mediastinal or hilar masses or evidence of adenopathy. Lungs are clear.  No pleural effusion or pneumothorax. Bony thorax is demineralized. There is a moderate, chronic, compression fracture at the thoracolumbar junction. IMPRESSION: No acute cardiopulmonary disease. Electronically Signed   By: Lajean Manes M.D.   On: 06/16/2015 20:13    Chart has been reviewed  Family  at  Bedside  plan of care was discussed with   Daughter Corwin Levins [phone]  Assessment/Plan  82 year old female history of atrial fibrillation and history of CVA on chronic anticoagulation presents with Palpitations and presyncope  Present on Admission:  . Near syncope - admit to telemetry cycle cardiac enzymes obtain echogram in the morning. Continue home medications given increased frequent PVCs with increased Toprol dose to 200 twice a day  . ATRIAL FIBRILLATION continue Coumadin and bridged with Lovenox until therapeutic. Increased  toprolol to 200 twice a day with holding parameters. Continue tikosyn, and PRN diltiazem, appreciate cardiology consult . Essential hypertension stable continue home medications increased dose of Toprol given increased number of PVCs  . Palpitations - patient is asymptomatic currently unclear etiology will cycle cardiac enzymes monitor on telemetry  patient does have increased PVCs that she does not appear to attribute them to palpitations.  Given lower extremity swelling on the left in associated dyspnea will obtain CT angina to rule out PE. Patient is subtherapeutic and Coumadin. Obtain lower extremity Doppler to evaluate for DVT      Prophylaxis: Coumadin, lovenox  CODE STATUS:    DNR/DNI as per patient   Disposition:   To home once workup is complete and patient is stable  Other plan as per orders.  I have spent a total of 55 min on this admission   Eleftherios Dudenhoeffer 06/17/2015, 1:38 AM    [facility]  Pager [phone]   after 2 AM please page floor coverage PA If 7AM-7PM, please contact the day team taking care of the patient  [email or web address]  Password [email or web address]

## 2015-06-17 NOTE — ED Notes (Signed)
Patient going to CT then to the floor

## 2015-06-17 NOTE — Progress Notes (Signed)
VASCULAR LAB PRELIMINARY  PRELIMINARY  PRELIMINARY  PRELIMINARY  Left lower extremity venous duplex completed.    Preliminary report:  There is no DVT or SVT noted in the left lower extremity.   Kaiyla Stahly, RVT 06/17/2015, 10:17 AM

## 2015-06-17 NOTE — Consult Note (Addendum)
Admit date: 06/16/2015 Referring Physician  Dr. Wendee Beavers Primary Physician  Dr. Heber Edgewood Primary Cardiologist  Dr. Thompson Grayer Reason for Consultation  Presyncope and palpitations  HPI: This is an 82yo female with a history of Atrial fibrillation, TIA, CVA, HTN, Fatigue, Dyspnea, Giant cell arteritis, Subclavian artery stenosis and Hypercholesteremia. She is followed by Dr. Rayann Heman for her Afib and is on chronic anticoagulation with coumadin. She is on Tikosyn for afib suppression.   She presented to the ER with complaints of palpitations for the past week.  She has also been having intermittent chest pain that she describes as a fullness.  On the day of admission she felt lightheaded and felt like her legs were going to give out.  This was associated with SOB.  She recently had shingles and has not felt good since then.  She has had poor PO intake and has not felt like eating.  She gets ligtheaded whenever she stands up or bends over.  She denies any PND, orthopnea but has had some LE edema and SOB recently.  In ER she was found to have a negative troponin x 2 and was noted to be in atrial fibrillation with 8 beats of WCT.  We are now asked to consult by Hospitalist for further evaluation of her atrial fibrillation and wide complex tachycardia.    She was last seen in afib clinic in January and was maintaining NSR on Tikosyn.  She now appears to be in atrial flutter with CVR and PVC's vs. Aberration.  She denies any chest pain but says when she goes into atrial fibrillation she will have a fullness in her chest which she had on admission.  She has has some mild DOE but denies any LE edema, PND, orthopnea or syncope.  Troponin is negative x 2.  Echo last summer showed normal LVF with mild to moderate AS and mild to moderate AR/MR.       PMH:   Past Medical History  Diagnosis Date  . Atrial fibrillation (HCC)     persistent, coumadin per PCP;  echo 06/06/09: EF 60%, mild LVH, Gr 1 diast dysfx, AV  gradient mean 9, mod MR, mod LAE;  Myoview 11/09: normal, EF 81%  . TIA (transient ischemic attack)   . CVA (cerebral vascular accident) ([place])     2006  . HTN (hypertension)   . Fatigue   . Dyspnea   . Giant cell arteritis [facility])     sees dr. at [facility]  . Back pain   . H/O: hysterectomy   . Subclavian artery stenosis, right     dopplers done 2/12;  carotids 2/12: 0-39% bilateral  . Hypercholesteremia     Secondary to vascular disease  . Cancer [facility])      PSH:   Past Surgical History  Procedure Laterality Date  . Knee surgery  3/06    Left  . Appendectomy    . Abdominal hysterectomy    . Prolapsed uterine fibroid ligation  7/05    Allergies:  Ciprofloxacin; Escitalopram; and Ranitidine Prior to Admit Meds:   Prescriptions prior to admission  Medication Sig Dispense Refill Last Dose  . ALPRAZolam (XANAX) 0.5 MG tablet Take 1 tablet by mouth 2 (two) times daily as needed. Sleep  1 06/15/2015 at Unknown time  . amLODipine (NORVASC) 5 MG tablet Take 5 mg by mouth daily as needed. For high blood pressure  11 unknwon at unknown  . aspirin 81 MG tablet Take 81 mg by  mouth daily.   06/16/2015 at Unknown time  . atorvastatin (LIPITOR) 20 MG tablet Take 0.5-1 tablets by mouth daily.  11 06/15/2015 at Unknown time  . azelastine (ASTELIN) 0.1 % nasal spray Place 2 sprays into both nostrils 2 (two) times daily as needed for rhinitis or allergies. Use in each nostril as directed   06/15/2015 at Unknown time  . diltiazem (CARDIZEM) 30 MG tablet Take 30 mg by mouth every 6 (six) hours as needed. For hart racing   06/15/2015 at Unknown time  . enalapril (VASOTEC) 20 MG tablet Take 20 mg by mouth 2 (two) times daily.    06/16/2015 at Unknown time  . HYDROcodone-acetaminophen (NORCO/VICODIN) 5-325 MG per tablet Take 1 tablet by mouth every 8 (eight) hours as needed. pain   06/16/2015 at Unknown time  . Magnesium 400 MG CAPS Take 1 capsule by mouth daily.   06/15/2015 at Unknown time  . metoprolol (TOPROL-XL) 200 MG  24 hr tablet Take 200 mg by mouth See admin instructions. Take 1/2  tablet by mouth in the morning and take 1 tablet by mouth in the afternoon  6 06/16/2015 at 0800  . naphazoline-pheniramine (NAPHCON-A) 0.025-0.3 % ophthalmic solution Place 1 drop into both eyes daily as needed for irritation.   06/15/2015 at Unknown time  . nitrofurantoin (MACRODANTIN) 100 MG capsule Take 100 mg by mouth daily.   06/15/2015 at Unknown time  . TIKOSYN 250 MCG capsule Take 1 capsule (250 mcg total) by mouth every 12 (twelve) hours. 180 capsule 3 06/16/2015 at Unknown time  . warfarin (COUMADIN) 5 MG tablet Take 2.5-5 mg by mouth as directed. Take 2.5mg  mon wed fridays and sundays whole tablet rest of days   06/15/2015 at Unknown time  . zolpidem (AMBIEN) 10 MG tablet Take 5 mg by mouth at bedtime as needed for sleep.   06/15/2015 at Unknown time  . nitroGLYCERIN (NITROSTAT) 0.4 MG SL tablet Place 1 tablet (0.4 mg total) under the tongue every 5 (five) minutes as needed for chest pain. 25 tablet 3 Taking   Fam HX:    Family History  Problem Relation Age of Onset  . Hypertension    . Stroke Neg Hx   . Diabetes type II Neg Hx   . Hypertension Daughter    Social HX:    Social History   Social History  . Marital Status: Married    Spouse Name: N/A  . Number of Children: N/A  . Years of Education: N/A   Occupational History  . Not on file.   Social History Main Topics  . Smoking status: Never Smoker   . Smokeless tobacco: Not on file  . Alcohol Use: No  . Drug Use: No  . Sexual Activity: Not on file   Other Topics Concern  . Not on file   Social History Narrative   She is happily married. Her husband was with her today. She lives between [place] and [place].  Retired [employer]vertising account planner.     ROS:  All 11 ROS were addressed and are negative except what is stated in the HPI  Physical Exam: Blood pressure 154/93, pulse 84, temperature 97.8 F (36.6 C), temperature source Oral, resp. rate 16, weight 126 lb 4 oz  (57.267 kg), SpO2 99 %.    General: Well developed, well nourished, in no acute distress Head: Eyes PERRLA, No xanthomas.   Normal cephalic and atramatic  Lungs:   Clear bilaterally to auscultation and percussion. Heart:   HRRR  S1 S2 Pulses are 2+ & equal. 2/6 SM at RUSB and LLSB and into carotid arteries            No carotid bruit. No JVD.  No abdominal bruits. No femoral bruits. Abdomen: Bowel sounds are positive, abdomen soft and non-tender without masses  Extremities:   No clubbing, cyanosis or edema.  DP +1 Neuro: Alert and oriented X 3. Psych:  Good affect, responds appropriately    Labs:   Lab Results  Component Value Date   WBC 7.6 06/17/2015   HGB 13.1 06/17/2015   HCT 41.3 06/17/2015   MCV 77.3* 06/17/2015   PLT 189 06/17/2015    Recent Labs Lab 06/17/15 0532  NA 138  K 4.2  CL 104  CO2 23  BUN 11  CREATININE 0.82  CALCIUM 9.1  PROT 6.4*  BILITOT 1.5*  ALKPHOS 71  ALT 23  AST 33  GLUCOSE 166*   No results found for: PTT Lab Results  Component Value Date   INR 2.00* 06/17/2015   INR 1.88* 06/16/2015   INR 3.12* 09/10/2010   Lab Results  Component Value Date   TROPONINI <0.03 06/17/2015    No results found for: CHOL No results found for: HDL No results found for: LDLCALC No results found for: TRIG No results found for: CHOLHDL No results found for: LDLDIRECT    Radiology:  Dg Chest 2 View  06/16/2015  CLINICAL DATA:  Pt c/o SOB, lightheadedness and weakness x 1 day. Denies any other chest complaints. Hx a fib, TIA, CVA and HTN. EXAM: CHEST  2 VIEW COMPARISON:  03/23/2015 FINDINGS: Cardiac silhouette is mildly enlarged. No mediastinal or hilar masses or evidence of adenopathy. Lungs are clear.  No pleural effusion or pneumothorax. Bony thorax is demineralized. There is a moderate, chronic, compression fracture at the thoracolumbar junction. IMPRESSION: No acute cardiopulmonary disease. Electronically Signed   By: Lajean Manes M.D.   On: 06/16/2015  20:13   Ct Angio Chest Pe W/cm &/or Wo Cm  06/17/2015  CLINICAL DATA:  Shortness of breath, near syncope, atrial fibrillation subtherapeutic INR. EXAM: CT ANGIOGRAPHY CHEST WITH CONTRAST TECHNIQUE: Multidetector CT imaging of the chest was performed using the standard protocol during bolus administration of intravenous contrast. Multiplanar CT image reconstructions and MIPs were obtained to evaluate the vascular anatomy. CONTRAST:  100 mL Isovue 370 IV COMPARISON:  Radiographs yesterday.  Chest CT 05/27/2007 FINDINGS: There are no filling defects within the pulmonary arteries to suggest pulmonary embolus. Ascending aorta measures 4.1 cm, this previously measured 4 cm 8 years prior. No dissection. There is a conventional branching pattern from the aortic arch. There are coronary artery and mitral annulus calcifications. No pericardial effusion. Calcified mediastinal lymph nodes without noncalcified adenopathy. Heterogeneous attenuation of lung parenchyma, most significant in the lower lobes, suggesting air trapping. There is atelectasis in the left lower lobe. Calcified granuloma in the right middle lobe. No pulmonary mass. No pleural effusion. No septal thickening. Mild contrast refluxing into the hepatic veins and IVC. Small hiatal hernia. Chronic compression deformity of L1. There are no acute or suspicious osseous abnormalities. Review of the MIP images confirms the above findings. IMPRESSION: 1. No pulmonary embolus. 2. Findings consistent with elevated right heart pressure with contrast refluxing into the hepatic veins and IVC. 3. Heterogeneous lower lobe aeration, suggesting air trapping. Electronically Signed   By: Jeb Levering M.D.   On: 06/17/2015 02:59    EKG:  Atrial flutter with CVR with occasional PVC's vs.  Aberration.   ASSESSMENT/PLAN:   1.  Paroxysmal atrial flutter suppressed on Tikosyn but now in atrial flutter with CVR.  Her QTc is prolonged on EKG at 574msec but difficult to assess  due to flutter waves. Will recheck EKG today.  Will have EP see in am.  Continue anticoagulation with warfarin.  INR is therapeutic.  She is on Reglan for nausea that can prolong QT so will d/c.   2.  Wide complex tachycardia 6 beats that could be aberration.  2D echo for LVF assessment is pending. LVF has been normal in the past. Last myoview in 2009. 3.  HTN - borderline controlled on current medical regimen.  Continue ACE I/BB.  Add Cardizem 30mg  q6 hours which will help with rate control and BP control 4.  Subclavian artery stenosis, right and very minimal carotid artery stenosis by dopplers in 2012.  Will repeat carotid dopplers.  Continue ASA/statin 5.  Hyperlipidemia - continue statin 6.  Weakness with near syncope ? Related to #1 or dehydration from poor PO intake. 7.  Chest discomfort described as a fullness which is not new for her.  She has had this in the past with her afib.  Her troponin is negative x 2.  She did have 6 beats of WCT.  Her last ischemic workup was in 2009.  Consider repeat Lexiscan myoview.   Sueanne Margarita, MD  06/17/2015  10:54 AM

## 2015-06-17 NOTE — Evaluation (Signed)
Physical Therapy Evaluation Patient Details Name: [name] [name] MRN: [id number] DOB: 07-13-1929 Today's Date: 06/17/2015   History of Present Illness  Patient is a 82 y/o female with hx of A-fib, HTN, CVA, Hypercholesteremia presents with palpitations and presyncope. DVT and PE negaitve. Workup pending.  Clinical Impression  Patient presents with balance deficits, mild dyspnea on exertion and decreased strength impacting mobility. Tolerated gait training with Min A for balance/safety due to unsteadiness- anticipate this will improve with increased mobility. Pt very active PTA and working in the yard. Encouraged ambulation a few times per day with RN to improve strength. Pt has support from son who lives across the street. Will follow acutely to maximize independence and mobility prior to return home.    Follow Up Recommendations Home health PT;Supervision - Intermittent    Equipment Recommendations  None recommended by PT    Recommendations for Other Services       Precautions / Restrictions Precautions Precautions: Fall Precaution Comments: A-fib Restrictions Weight Bearing Restrictions: No      Mobility  Bed Mobility Overal bed mobility: Needs Assistance Bed Mobility: Supine to Sit     Supine to sit: Modified independent (Device/Increase time);HOB elevated     General bed mobility comments: Use of rail. No assist needed.  Transfers Overall transfer level: Needs assistance Equipment used: None Transfers: Sit to/from Stand Sit to Stand: Min guard         General transfer comment: Min guard for safety.  No dizziness. Mild unsteadiness noted. Transferred to chair post ambulation bout.  Ambulation/Gait Ambulation/Gait assistance: Min assist Ambulation Distance (Feet): 200 Feet Assistive device: None Gait Pattern/deviations: Step-through pattern;Decreased stride length;Drifts right/left Gait velocity: decreased   General Gait Details: Unsteady gait with drifting  noted to right/left- reaching for rail at times for support. A-fib with HR up to 120 bpm.   Stairs            Wheelchair Mobility    Modified Rankin (Stroke Patients Only)       Balance Overall balance assessment: Needs assistance Sitting-balance support: Feet supported;No upper extremity supported Sitting balance-Leahy Scale: Good     Standing balance support: During functional activity Standing balance-Leahy Scale: Fair Standing balance comment: Needs UE support during dynamic standing.                             Pertinent Vitals/Pain Pain Assessment: No/denies pain (just nausea)    Home Living Family/patient expects to be discharged to:: Private residence Living Arrangements: Alone Available Help at Discharge: Family;Available PRN/intermittently (son lives across the street) Type of Home: House Home Access: Stairs to enter Entrance Stairs-Rails: Right Entrance Stairs-Number of Steps: 5 Home Layout: One level Home Equipment: Environmental consultant - 2 wheels;Cane - single point;Walker - 4 wheels;Bedside commode;Shower seat      Prior Function Level of Independence: Independent         Comments: Works in the yard, loves to [employer]. Drives.     Hand Dominance        Extremity/Trunk Assessment   Upper Extremity Assessment: Defer to OT evaluation           Lower Extremity Assessment: Generalized weakness      Cervical / Trunk Assessment: Kyphotic  Communication   Communication: No difficulties  Cognition Arousal/Alertness: Awake/alert Behavior During Therapy: WFL for tasks assessed/performed Overall Cognitive Status: Within Functional Limits for tasks assessed  General Comments      Exercises        Assessment/Plan    PT Assessment Patient needs continued PT services  PT Diagnosis Difficulty walking;Generalized weakness   PT Problem List Decreased strength;Decreased balance;Decreased mobility;Cardiopulmonary  status limiting activity  PT Treatment Interventions Balance training;Gait training;Functional mobility training;Therapeutic activities;Therapeutic exercise;Patient/family education;Stair training;DME instruction   PT Goals (Current goals can be found in the Care Plan section) Acute Rehab PT Goals Patient Stated Goal: "to get back to mowing my lawn" PT Goal Formulation: With patient Time For Goal Achievement: 07/01/15 Potential to Achieve Goals: Good    Frequency Min 3X/week   Barriers to discharge Decreased caregiver support LIves alone    Co-evaluation               End of Session Equipment Utilized During Treatment: Gait belt Activity Tolerance: Patient tolerated treatment well Patient left: in chair;with call bell/phone within reach;with chair alarm set Nurse Communication: Mobility status    Functional Assessment Tool Used: clinical judgment Functional Limitation: Mobility: Walking and moving around Mobility: Walking and Moving Around Current Status [phone]): At least 20 percent but less than 40 percent impaired, limited or restricted Mobility: Walking and Moving Around Goal Status [phone]): At least 1 percent but less than 20 percent impaired, limited or restricted    Time: [id number] PT Time Calculation (min) (ACUTE ONLY): 24 min   Charges:   PT Evaluation $PT Eval Moderate Complexity: 1 Procedure PT Treatments $Gait Training: 8-22 mins   PT G Codes:   PT G-Codes **NOT FOR INPATIENT CLASS** Functional Assessment Tool Used: clinical judgment Functional Limitation: Mobility: Walking and moving around Mobility: Walking and Moving Around Current Status [id number]): At least 20 percent but less than 40 percent impaired, limited or restricted Mobility: Walking and Moving Around Goal Status [phone]): At least 1 percent but less than 20 percent impaired, limited or restricted    [place] 06/17/2015, 11:50 AM Wray Kearns, PT, DPT [phone]

## 2015-06-17 NOTE — Progress Notes (Signed)
No new complaints reported to me. No acute issues overnight. Please refer to H&P for details regarding assessment and plan.  We'll plan on reassessing next a.m. of note Toprol recently increased  Gen.: Patient in no acute distress, alert and awake Cardiovascular: S1 and S2 present, no cyanosis Pulmonary: No increased work of breathing, no wheezes  [place], [employer]

## 2015-06-18 DIAGNOSIS — I484 Atypical atrial flutter: Secondary | ICD-10-CM

## 2015-06-18 DIAGNOSIS — I4892 Unspecified atrial flutter: Secondary | ICD-10-CM | POA: Diagnosis not present

## 2015-06-18 DIAGNOSIS — I4891 Unspecified atrial fibrillation: Secondary | ICD-10-CM | POA: Diagnosis not present

## 2015-06-18 DIAGNOSIS — R55 Syncope and collapse: Secondary | ICD-10-CM | POA: Diagnosis not present

## 2015-06-18 DIAGNOSIS — I10 Essential (primary) hypertension: Secondary | ICD-10-CM | POA: Diagnosis not present

## 2015-06-18 LAB — CBC
HEMATOCRIT: 39.1 % (ref 36.0–46.0)
HEMOGLOBIN: 12.2 g/dL (ref 12.0–15.0)
MCH: 24.3 pg — ABNORMAL LOW (ref 26.0–34.0)
MCHC: 31.2 g/dL (ref 30.0–36.0)
MCV: 77.7 fL — ABNORMAL LOW (ref 78.0–100.0)
Platelets: 183 10*3/uL (ref 150–400)
RBC: 5.03 MIL/uL (ref 3.87–5.11)
RDW: 15 % (ref 11.5–15.5)
WBC: 7.1 10*3/uL (ref 4.0–10.5)

## 2015-06-18 LAB — BASIC METABOLIC PANEL
ANION GAP: 11 (ref 5–15)
BUN: 7 mg/dL (ref 6–20)
CHLORIDE: 106 mmol/L (ref 101–111)
CO2: 23 mmol/L (ref 22–32)
Calcium: 8.9 mg/dL (ref 8.9–10.3)
Creatinine, Ser: 0.81 mg/dL (ref 0.44–1.00)
GFR calc non Af Amer: >60 mL/min (ref >60)
Glucose, Bld: 143 mg/dL — ABNORMAL HIGH (ref 65–99)
POTASSIUM: 3.8 mmol/L (ref 3.5–5.1)
Sodium: 140 mmol/L (ref 135–145)

## 2015-06-18 LAB — PROTIME-INR
INR: 1.92 — AB (ref 0.00–1.49)
Prothrombin Time: 21.9 seconds — ABNORMAL HIGH (ref 11.6–15.2)

## 2015-06-18 LAB — GLUCOSE, CAPILLARY: Glucose-Capillary: 135 mg/dL — ABNORMAL HIGH (ref 65–99)

## 2015-06-18 MED ORDER — WARFARIN SODIUM 7.5 MG PO TABS: 7.5000 mg | ORAL_TABLET | Freq: Once | ORAL | Status: DC

## 2015-06-18 MED ORDER — ENOXAPARIN SODIUM 60 MG/0.6ML ~~LOC~~ SOLN
60.0000 mg | Freq: Two times a day (BID) | SUBCUTANEOUS | Status: DC
  Administered 2015-06-18: 60 mg via SUBCUTANEOUS
  Filled 2015-06-18: qty 0.6

## 2015-06-18 MED ORDER — METOPROLOL SUCCINATE ER 200 MG PO TB24: 200.0000 mg | ORAL_TABLET | Freq: Two times a day (BID) | ORAL | Status: DC

## 2015-06-18 MED ORDER — POTASSIUM CHLORIDE CRYS ER 20 MEQ PO TBCR
40.0000 meq | EXTENDED_RELEASE_TABLET | Freq: Once | ORAL | Status: AC
  Administered 2015-06-18: 40 meq via ORAL
  Filled 2015-06-18: qty 2

## 2015-06-18 MED ORDER — ENOXAPARIN SODIUM 60 MG/0.6ML ~~LOC~~ SOLN: 60.0000 mg | Freq: Two times a day (BID) | SUBCUTANEOUS | Status: DC

## 2015-06-18 NOTE — Discharge Summary (Signed)
Physician Discharge Summary  [name] [name] [id number] DOB: 1929-08-02 DOA: 06/16/2015  PCP: Raelene Bott, MD  Admit date: 06/16/2015 Discharge date: 06/18/2015  Time spent: > 35 minutes  Recommendations for Outpatient Follow-up:  1. Monitor INR levels and please advice when to discontinue lovenox 2. Ensure patient follows up with cardiologist   Discharge Diagnoses:  Active Problems:   Essential hypertension   ATRIAL FIBRILLATION   Palpitations   Anticoagulant long-term use   Near syncope   Pre-syncope   Discharge Condition: stable  Diet recommendation: Heart healthy  Filed Weights   06/16/15 1857 06/18/15 0508  Weight: 57.267 kg (126 lb 4 oz) 56.654 kg (124 lb 14.4 oz)    History of present illness:  82 y/o with history of Atrial fibrillation (HCC); TIA (transient ischemic attack); CVA (cerebral vascular accident) ([place]); HTN (hypertension); Fatigue; Dyspnea; Giant cell arteritis ([place]); Back pain; H/O: hysterectomy; Subclavian artery stenosis, right; and Hypercholesteremia who presented complaining of palpitations, light headedness, and complaints of irregular heart beat.   Hospital Course:  Near syncope - admit to telemetry cycle cardiac enzymes obtain echogram in the morning. Continue home medications given increased frequent PVCs with increased Toprol dose to 200 twice a day. Cardiology consulted and EP specialist evaluated. Recommended lovenox Use while patient's INR is subtherapeutic. Patient was offered cardioversion but she preferred to follow-up with her outpatient cardiologist for further evaluation and recommendations  . ATRIAL FIBRILLATION continue Coumadin and bridged with Lovenox until therapeutic. Increased toprolol to 200 twice a day with holding parameters. Continue tikosyn. EP specialist currently concerned patient may require amiodarone but as mentioned in their note this can be discussed as outpatient  . Essential hypertension stable continue home medications  increased dose of Toprol given increased number of PVCs  . Palpitations - patient is asymptomatic currently unclear etiology will cycle cardiac enzymes monitor on telemetry patient does have increased PVCs that she does not appear to attribute them to palpitations.   - Physical therapy recommended home health PT but patient refused    Procedures:  None  Consultations:  Cardiology  Electrophysiologist  Discharge Exam: Filed Vitals:   06/18/15 0508 06/18/15 0624  BP: 94/48 140/90  Pulse: 86   Temp: 97.5 F (36.4 C)   Resp: 18     General: Patient in no acute distress, alert and awake Cardiovascular: Irregularly irregular with appropriate ventricular response, no rubs Respiratory: No increased work of breathing, no wheezes, equal chest rise  Discharge Instructions   Discharge Instructions    Call MD for:  difficulty breathing, headache or visual disturbances    Complete by:  As directed      Call MD for:  severe uncontrolled pain    Complete by:  As directed      Call MD for:  temperature >100.4    Complete by:  As directed      Diet - low sodium heart healthy    Complete by:  As directed      Discharge instructions    Complete by:  As directed   Monitor INR levels and stop lovenox when INR therapeutic. Please ensure patient follows up with cardiology.     Increase activity slowly    Complete by:  As directed           Current Discharge Medication List    START taking these medications   Details  enoxaparin (LOVENOX) 60 MG/0.6ML injection Inject 0.6 mLs (60 mg total) into the skin every 12 (twelve) hours. Qty: 6 Syringe, Refills:  0      CONTINUE these medications which have CHANGED   Details  metoprolol succinate (TOPROL-XL) 200 MG 24 hr tablet Take 1 tablet (200 mg total) by mouth 2 (two) times daily. Take with or immediately following a meal. Qty: 60 tablet, Refills: 0      CONTINUE these medications which have NOT CHANGED   Details  ALPRAZolam  (XANAX) 0.5 MG tablet Take 1 tablet by mouth 2 (two) times daily as needed. Sleep Refills: 1    aspirin 81 MG tablet Take 81 mg by mouth daily.    atorvastatin (LIPITOR) 20 MG tablet Take 0.5 tablets by mouth every other day.  Refills: 11    azelastine (ASTELIN) 0.1 % nasal spray Place 2 sprays into both nostrils 2 (two) times daily as needed for rhinitis or allergies. Use in each nostril as directed    diltiazem (CARDIZEM) 30 MG tablet Take 30 mg by mouth every 6 (six) hours as needed. For hart racing    enalapril (VASOTEC) 20 MG tablet Take 20 mg by mouth 2 (two) times daily.     HYDROcodone-acetaminophen (NORCO/VICODIN) 5-325 MG per tablet Take 1 tablet by mouth every 8 (eight) hours as needed. pain    Magnesium 400 MG CAPS Take 1 capsule by mouth daily.    naphazoline-pheniramine (NAPHCON-A) 0.025-0.3 % ophthalmic solution Place 1 drop into both eyes daily as needed for irritation.    nitrofurantoin (MACRODANTIN) 100 MG capsule Take 100 mg by mouth daily.    TIKOSYN 250 MCG capsule Take 1 capsule (250 mcg total) by mouth every 12 (twelve) hours. Qty: 180 capsule, Refills: 3    warfarin (COUMADIN) 5 MG tablet Take 2.5-5 mg by mouth as directed. Take 2.5mg  mon wed fridays and sundays whole tablet rest of days    zolpidem (AMBIEN) 10 MG tablet Take 5 mg by mouth at bedtime as needed for sleep.    nitroGLYCERIN (NITROSTAT) 0.4 MG SL tablet Place 1 tablet (0.4 mg total) under the tongue every 5 (five) minutes as needed for chest pain. Qty: 25 tablet, Refills: 3      STOP taking these medications     amLODipine (NORVASC) 5 MG tablet        Allergies  Allergen Reactions  . Ciprofloxacin Other (See Comments)    tachycardia  . Escitalopram     "lips puffed", "tight in chest", "nasuea", "dizzy"  . Ranitidine Diarrhea      The results of significant diagnostics from this hospitalization (including imaging, microbiology, ancillary and laboratory) are listed below for  reference.    Significant Diagnostic Studies: Dg Chest 2 View  06/16/2015  CLINICAL DATA:  Pt c/o SOB, lightheadedness and weakness x 1 day. Denies any other chest complaints. Hx a fib, TIA, CVA and HTN. EXAM: CHEST  2 VIEW COMPARISON:  03/23/2015 FINDINGS: Cardiac silhouette is mildly enlarged. No mediastinal or hilar masses or evidence of adenopathy. Lungs are clear.  No pleural effusion or pneumothorax. Bony thorax is demineralized. There is a moderate, chronic, compression fracture at the thoracolumbar junction. IMPRESSION: No acute cardiopulmonary disease. Electronically Signed   By: Lajean Manes M.D.   On: 06/16/2015 20:13   Ct Angio Chest Pe W/cm &/or Wo Cm  06/17/2015  CLINICAL DATA:  Shortness of breath, near syncope, atrial fibrillation subtherapeutic INR. EXAM: CT ANGIOGRAPHY CHEST WITH CONTRAST TECHNIQUE: Multidetector CT imaging of the chest was performed using the standard protocol during bolus administration of intravenous contrast. Multiplanar CT image reconstructions and MIPs were obtained  to evaluate the vascular anatomy. CONTRAST:  100 mL Isovue 370 IV COMPARISON:  Radiographs yesterday.  Chest CT 05/27/2007 FINDINGS: There are no filling defects within the pulmonary arteries to suggest pulmonary embolus. Ascending aorta measures 4.1 cm, this previously measured 4 cm 8 years prior. No dissection. There is a conventional branching pattern from the aortic arch. There are coronary artery and mitral annulus calcifications. No pericardial effusion. Calcified mediastinal lymph nodes without noncalcified adenopathy. Heterogeneous attenuation of lung parenchyma, most significant in the lower lobes, suggesting air trapping. There is atelectasis in the left lower lobe. Calcified granuloma in the right middle lobe. No pulmonary mass. No pleural effusion. No septal thickening. Mild contrast refluxing into the hepatic veins and IVC. Small hiatal hernia. Chronic compression deformity of L1. There are no  acute or suspicious osseous abnormalities. Review of the MIP images confirms the above findings. IMPRESSION: 1. No pulmonary embolus. 2. Findings consistent with elevated right heart pressure with contrast refluxing into the hepatic veins and IVC. 3. Heterogeneous lower lobe aeration, suggesting air trapping. Electronically Signed   By: Jeb Levering M.D.   On: 06/17/2015 02:59    Microbiology: No results found for this or any previous visit (from the past 240 hour(s)).   Labs: Basic Metabolic Panel:  Recent Labs Lab 06/16/15 1855 06/16/15 2045 06/17/15 0532 06/18/15 0426  NA 138  --  138 140  K 4.8  --  4.2 3.8  CL 102  --  104 106  CO2 23  --  23 23  GLUCOSE 148*  --  166* 143*  BUN 14  --  11 7  CREATININE 0.98  --  0.82 0.81  CALCIUM 9.5  --  9.1 8.9  MG  --  2.1 2.0  --   PHOS  --  3.2 3.3  --    Liver Function Tests:  Recent Labs Lab 06/17/15 0532  AST 33  ALT 23  ALKPHOS 71  BILITOT 1.5*  PROT 6.4*  ALBUMIN 3.3*   No results for input(s): LIPASE, AMYLASE in the last 168 hours. No results for input(s): AMMONIA in the last 168 hours. CBC:  Recent Labs Lab 06/16/15 1855 06/17/15 0532 06/18/15 0426  WBC 7.7 7.6 7.1  HGB 14.5 13.1 12.2  HCT 44.1 41.3 39.1  MCV 77.4* 77.3* 77.7*  PLT 211 189 183   Cardiac Enzymes:  Recent Labs Lab 06/16/15 1855 06/16/15 2310 06/17/15 0532 06/17/15 1112 06/17/15 1644  TROPONINI <0.03 <0.03 <0.03 <0.03 <0.03   BNP: BNP (last 3 results)  Recent Labs  06/16/15 1855  BNP 242.4*    ProBNP (last 3 results) No results for input(s): PROBNP in the last 8760 hours.  CBG:  Recent Labs Lab 06/17/15 0641 06/18/15 0610  GLUCAP 154* 135*    Signed:  Velvet Bathe MD.  [facility] 06/18/2015, 1:08 PM

## 2015-06-18 NOTE — Progress Notes (Signed)
ANTICOAGULATION CONSULT NOTE - Initial Consult  Pharmacy Consult for Lovenox and Coumadin Indication: atrial fibrillation  Allergies  Allergen Reactions  . Ciprofloxacin Other (See Comments)    tachycardia  . Escitalopram     "lips puffed", "tight in chest", "nasuea", "dizzy"  . Ranitidine Diarrhea    Patient Measurements: Height: 5\' 2"  (157.5 cm) Weight: 124 lb 14.4 oz (56.654 kg) IBW/kg (Calculated) : 50.1  Vital Signs: Temp: 97.5 F (36.4 C) (04/09 0508) Temp Source: Oral (04/09 0508) BP: 140/90 mmHg (04/09 0624) Pulse Rate: 86 (04/09 0508)  Labs:  Recent Labs  06/16/15 1855  06/17/15 0532 06/17/15 1112 06/17/15 1644 06/18/15 0426  HGB 14.5  --  13.1  --   --  12.2  HCT 44.1  --  41.3  --   --  39.1  PLT 211  --  189  --   --  183  LABPROT 21.6*  --  22.6*  --   --  21.9*  INR 1.88*  --  2.00*  --   --  1.92*  CREATININE 0.98  --  0.82  --   --  0.81  TROPONINI <0.03  < > <0.03 <0.03 <0.03  --   < > = values in this interval not displayed.  Estimated Creatinine Clearance: 40.9 mL/min (by C-G formula based on Cr of 0.81).   Medical History: Past Medical History  Diagnosis Date  . Atrial fibrillation (HCC)     persistent, coumadin per PCP;  echo 06/06/09: EF 60%, mild LVH, Gr 1 diast dysfx, AV gradient mean 9, mod MR, mod LAE;  Myoview 11/09: normal, EF 81%  . TIA (transient ischemic attack)   . CVA (cerebral vascular accident) ([place])     2006  . HTN (hypertension)   . Fatigue   . Dyspnea   . Giant cell arteritis [facility])     sees dr. at [facility]  . Back pain   . H/O: hysterectomy   . Subclavian artery stenosis, right     dopplers done 2/12;  carotids 2/12: 0-39% bilateral  . Hypercholesteremia     Secondary to vascular disease  . Cancer [facility])      Assessment: 82yo female with history of Atrial fibrillation, TIA, CVA, HTN, Fatigue, Dyspnea, Giant cell arteritis, Subclavian artery stenosis and Hypercholesteremia.  She is on chronic anticoagulation PTA with  coumadin and tikosyn for afib.   Home dose Warfarin: 5mg  daily except 2.5mg  MWF (last dose 06/15/15).  Does not seem patient had a dose on 4/7.   INR now subtherapeutic at 1.92 after one boosted dose of 7.5 mg.  Cards would like to bridge w/ lovenox, discharge patient and have INR checked on outpatient tomorrow.     Goal of Therapy:  INR 2-3 Anti-Xa level 0.6-1 units/ml 4hrs after LMWH dose given Monitor platelets by anticoagulation protocol: Yes   Plan:  Lovenox 1 mg/kg (60 mg) q12h  Coumadin 7.5mg  x 1 as ordered Monitor for s/sx of bleeding, changes in renal function if continues lovenox Daily INR   Bennye Alm, PharmD Pharmacy Resident [phone]

## 2015-06-18 NOTE — Progress Notes (Signed)
Pt ambulated entire unit. Pt tolerated activity well Pt ambulated with Maryjean Corpening,RN with no assistance. Pt now resting in bed comfortably.

## 2015-06-18 NOTE — Consult Note (Signed)
CARDIOLOGY CONSULT NOTE     Primary Care Physician: Raelene Bott, MD Referring Physician: Merrily Pew Date: 06/16/2015  Reason for consultation:  [name] [name] is a 82 y.o. female with a h/o Atrial fibrillation, TIA, CVA, HTN, Fatigue, Dyspnea, Giant cell arteritis, Subclavian artery stenosis and Hypercholesteremia. She is followed by Dr. Rayann Heman for her Afib and is on chronic anticoagulation with coumadin. She is on Tikosyn for afib suppression. She presented to the ER with complaints of palpitations for the past week. She has also been having intermittent chest pain that she describes as a fullness. On the day of admission she felt lightheaded and felt like her legs were going to give out. This was associated with SOB. She recently had shingles and has not felt good since then. She has had poor PO intake and has not felt like eating. She gets ligtheaded whenever she stands up or bends over. She denies any PND, orthopnea but has had some LE edema and SOB recently. In ER she was found to have a negative troponin x 2 and was noted to be in atrial fibrillation with 8 beats of WCT. On arrival, her INR was low at 1.88 and has been low again today at 1.92.  She states that she feels much better today with increased energy.  She says that her legs feel much improved and she is ready to go home.  She does have an appointment tomorrow in AF clinic at 14:00.  Today, she denies symptoms of palpitations, chest pain, shortness of breath, orthopnea, PND, lower extremity edema, dizziness, presyncope, syncope, or neurologic sequela. The patient is tolerating medications without difficulties and is otherwise without complaint today.   Past Medical History  Diagnosis Date  . Atrial fibrillation (HCC)     persistent, coumadin per PCP;  echo 06/06/09: EF 60%, mild LVH, Gr 1 diast dysfx, AV gradient mean 9, mod MR, mod LAE;  Myoview 11/09: normal, EF 81%  . TIA (transient ischemic attack)   . CVA  (cerebral vascular accident) ([place])     2006  . HTN (hypertension)   . Fatigue   . Dyspnea   . Giant cell arteritis [facility])     sees dr. at [facility]  . Back pain   . H/O: hysterectomy   . Subclavian artery stenosis, right     dopplers done 2/12;  carotids 2/12: 0-39% bilateral  . Hypercholesteremia     Secondary to vascular disease  . Cancer [facility])    Past Surgical History  Procedure Laterality Date  . Knee surgery  3/06    Left  . Appendectomy    . Abdominal hysterectomy    . Prolapsed uterine fibroid ligation  7/05    . aspirin  81 mg Oral Daily  . atorvastatin  10 mg Oral QODAY  . diltiazem  30 mg Oral 4 times per day  . dofetilide  250 mcg Oral Q12H  . enalapril  20 mg Oral BID  . feeding supplement (ENSURE ENLIVE)  237 mL Oral BID BM  . metoprolol  200 mg Oral BID  . nitrofurantoin  100 mg Oral Daily  . sodium chloride flush  3 mL Intravenous Q12H  . Warfarin - Pharmacist Dosing Inpatient   Does not apply q1800      Allergies  Allergen Reactions  . Ciprofloxacin Other (See Comments)    tachycardia  . Escitalopram     "lips puffed", "tight in chest", "nasuea", "dizzy"  . Ranitidine Diarrhea    Social History  Social History  . Marital Status: Married    Spouse Name: N/A  . Number of Children: N/A  . Years of Education: N/A   Occupational History  . Not on file.   Social History Main Topics  . Smoking status: Never Smoker   . Smokeless tobacco: Not on file  . Alcohol Use: No  . Drug Use: No  . Sexual Activity: Not on file   Other Topics Concern  . Not on file   Social History Narrative   She is happily married. Her husband was with her today. She lives between [place] and [place].  Retired Advertising account planner.    Family History  Problem Relation Age of Onset  . Hypertension    . Stroke Neg Hx   . Diabetes type II Neg Hx   . Hypertension Daughter     ROS- All systems are reviewed and negative except as per the HPI above  Physical  Exam: Telemetry: Filed Vitals:   06/17/15 2047 06/17/15 2234 06/18/15 0508 06/18/15 0624  BP: 108/65 114/68 94/48 140/90  Pulse: 110 92 86   Temp: 97.5 F (36.4 C)  97.5 F (36.4 C)   TempSrc: Oral  Oral   Resp: 18  18   Height:      Weight:   124 lb 14.4 oz (56.654 kg)   SpO2: 95%  96%     GEN- The patient is well appearing, alert and oriented x 3 today.   Head- normocephalic, atraumatic Eyes-  Sclera clear, conjunctiva pink Ears- hearing intact Oropharynx- clear Neck- supple, no JVP Lymph- no cervical lymphadenopathy Lungs- Clear to ausculation bilaterally, normal work of breathing Heart- Tachycardic, irregular, 2/6 systolic murmur at RUSB, norubs or gallops, PMI not laterally displaced GI- soft, NT, ND, + BS Extremities- no clubbing, cyanosis, or edema MS- no significant deformity or atrophy Skin- no rash or lesion Psych- euthymic mood, full affect Neuro- strength and sensation are intact  EKG: atrial flutter  Labs:   Lab Results  Component Value Date   WBC 7.1 06/18/2015   HGB 12.2 06/18/2015   HCT 39.1 06/18/2015   MCV 77.7* 06/18/2015   PLT 183 06/18/2015    Recent Labs Lab 06/17/15 0532 06/18/15 0426  NA 138 140  K 4.2 3.8  CL 104 106  CO2 23 23  BUN 11 7  CREATININE 0.82 0.81  CALCIUM 9.1 8.9  PROT 6.4*  --   BILITOT 1.5*  --   ALKPHOS 71  --   ALT 23  --   AST 33  --   GLUCOSE 166* 143*   Lab Results  Component Value Date   TROPONINI <0.03 06/17/2015   No results found for: CHOL No results found for: HDL No results found for: LDLCALC No results found for: TRIG No results found for: CHOLHDL No results found for: LDLDIRECT    Radiology: Chest CT 1. No pulmonary embolus. 2. Findings consistent with elevated right heart pressure with contrast refluxing into the hepatic veins and IVC. 3. Heterogeneous lower lobe aeration, suggesting air trapping.  Echo 10/17/14: - Left ventricle: The cavity size was normal. There was mild focal  basal  hypertrophy of the septum. Systolic function was vigorous.  The estimated ejection fraction was in the range of 65% to 70%.  Wall motion was normal; there were no regional wall motion  abnormalities. Features are consistent with a pseudonormal left  ventricular filling pattern, with concomitant abnormal relaxation  and increased filling pressure (grade 2 diastolic dysfunction).  Doppler parameters are consistent with elevated ventricular  end-diastolic filling pressure. - Aortic valve: Trileaflet; moderately thickened, moderately  calcified leaflets. Valve mobility was restricted. There was mild  to moderate stenosis. There was mild to moderate regurgitation.  Mean gradient (S): 21 mm Hg. - Aortic root: The aortic root was normal in size. - Mitral valve: Calcified annulus. Moderately thickened, mildly  calcified leaflets . There was moderate regurgitation directed  centrally and posteriorly. - Left atrium: The atrium was severely dilated. - Right ventricle: The cavity size was normal. Wall thickness was  normal. Systolic function was normal. - Right atrium: The atrium was mildly dilated. - Tricuspid valve: There was mild regurgitation. - Pulmonary arteries: Systolic pressure was at the upper limits of  normal. PA peak pressure: 35 mm Hg (S). - Inferior vena cava: The vessel was normal in size. - Pericardium, extracardiac: There was no pericardial effusion.  ASSESSMENT AND PLAN:   1. Atrial flutter: appears atypical by ECG.  I discussed management options with her.  unfortunately her INR has been low and she would need TEE if she were to be cardioverted.  I offered her TEE/CV tomorrow vs discharge with follow up in clinic.  She feels well today without symptoms.  She has a clinic appointment in the AF clinic tomorrow and would prefer to make that appointment.  Would need to go home on Lovenox as she is not therapeutic and have her INR checked tomorrow.  I have consulted case  management for help with this.  She has had a recurrence in the past and thus may be failing Tikosyn.  There have been discussions on switching to amiodarone but this may be better suited for an outpatient discussion at this time.   Cyndia Degraff Meredith Leeds, MD 06/18/2015  8:02 AM

## 2015-06-18 NOTE — Progress Notes (Signed)
Pt discharged to home. Discharge orders and education were discussed with pt and family member. Pt knows to get her Lovenox injections from [facility]. Pt  Verbalizes understanding and has no further questions at this time.

## 2015-06-18 NOTE — Progress Notes (Signed)
CM received call from RN requesting help with home lovenox cost.  Unfortunately, pt is not eligible for [facility] as she has insurance. Pt does have [place] Medicare.  NO other CM needs were communicated.

## 2015-06-19 ENCOUNTER — Ambulatory Visit (HOSPITAL_COMMUNITY)
Admission: RE | Admit: 2015-06-19 | Discharge: 2015-06-19 | Disposition: A | Discharge status: Home / Self Care | Payer: Medicare Other | Source: Ambulatory Visit | Attending: Nurse Practitioner | Admitting: Nurse Practitioner

## 2015-06-19 ENCOUNTER — Encounter (HOSPITAL_COMMUNITY): Payer: Self-pay | Admitting: Nurse Practitioner

## 2015-06-19 VITALS — BP 140/82 | HR 68 | Ht 62.0 in | Wt 128.0 lb

## 2015-06-19 DIAGNOSIS — Z8673 Personal history of transient ischemic attack (TIA), and cerebral infarction without residual deficits: Secondary | ICD-10-CM | POA: Insufficient documentation

## 2015-06-19 DIAGNOSIS — Z79899 Other long term (current) drug therapy: Secondary | ICD-10-CM | POA: Diagnosis not present

## 2015-06-19 DIAGNOSIS — I10 Essential (primary) hypertension: Secondary | ICD-10-CM | POA: Insufficient documentation

## 2015-06-19 DIAGNOSIS — Z881 Allergy status to other antibiotic agents status: Secondary | ICD-10-CM | POA: Insufficient documentation

## 2015-06-19 DIAGNOSIS — Z8249 Family history of ischemic heart disease and other diseases of the circulatory system: Secondary | ICD-10-CM | POA: Diagnosis not present

## 2015-06-19 DIAGNOSIS — E7800 Pure hypercholesterolemia, unspecified: Secondary | ICD-10-CM | POA: Insufficient documentation

## 2015-06-19 DIAGNOSIS — Z7901 Long term (current) use of anticoagulants: Secondary | ICD-10-CM | POA: Insufficient documentation

## 2015-06-19 DIAGNOSIS — Z888 Allergy status to other drugs, medicaments and biological substances status: Secondary | ICD-10-CM | POA: Diagnosis not present

## 2015-06-19 DIAGNOSIS — Z7982 Long term (current) use of aspirin: Secondary | ICD-10-CM | POA: Insufficient documentation

## 2015-06-19 DIAGNOSIS — R110 Nausea: Secondary | ICD-10-CM | POA: Insufficient documentation

## 2015-06-19 DIAGNOSIS — I4892 Unspecified atrial flutter: Secondary | ICD-10-CM | POA: Diagnosis present

## 2015-06-19 LAB — HEMOGLOBIN A1C
Hgb A1c MFr Bld: 7.6 % — ABNORMAL HIGH (ref 4.8–5.6)
Mean Plasma Glucose: 171 mg/dL

## 2015-06-19 LAB — PROTIME-INR
INR: 2.07 — AB (ref 0.00–1.49)
PROTHROMBIN TIME: 23.2 s — AB (ref 11.6–15.2)

## 2015-06-19 NOTE — Progress Notes (Signed)
Patient ID: [name] [name], female   DOB: May 07, 1929, 82 y.o.   MRN: [id number]        Primary Care Physician: Raelene Bott, MD Referring Physician: Dr. Sabino Donovan is a 82 y.o. female with a h/o persistent afib on tikosyn. She reports that her brand tikosyn was substituted for generic afib and within a few days she went into afib and stayed in afib x 5 days. It was only after being on brand tikosyn for 2 days she returned  to NSR, and has requested to stay on brand tikosyn. She continues on warfarin.  She presented to [facility] ER with c/o palpitations, near syncope and nausea, and was admitted 4/7 thru 4/9. Dicussed cardioversion but her INR was sub therapeutic at 1.8. She felt better the next day and wished not to stay for cardioversion but d/c home. She had increas in PVC's while in hospital and toprol was increased to 200 mg bid. Tikosyn continued. She believes she went back in rhythm sometime yesterday.She feels well today and will have her INR checked today, she continues on Lovenox shots. States that she has chronic nausea for years, no vomiting and would like to see a GI specialist.  Today, she denies symptoms of palpitations, chest pain, shortness of breath, orthopnea, PND, lower extremity edema, dizziness, presyncope, syncope, or neurologic sequela. The patient is tolerating medications without difficulties and is otherwise without complaint today.   Past Medical History  Diagnosis Date  . Atrial fibrillation (HCC)     persistent, coumadin per PCP;  echo 06/06/09: EF 60%, mild LVH, Gr 1 diast dysfx, AV gradient mean 9, mod MR, mod LAE;  Myoview 11/09: normal, EF 81%  . TIA (transient ischemic attack)   . CVA (cerebral vascular accident) ([place])     2006  . HTN (hypertension)   . Fatigue   . Dyspnea   . Giant cell arteritis [facility])     sees dr. at [facility]  . Back pain   . H/O: hysterectomy   . Subclavian artery stenosis, right     dopplers done 2/12;  carotids 2/12: 0-39%  bilateral  . Hypercholesteremia     Secondary to vascular disease  . Cancer [facility])    Past Surgical History  Procedure Laterality Date  . Knee surgery  3/06    Left  . Appendectomy    . Abdominal hysterectomy    . Prolapsed uterine fibroid ligation  7/05    Current Outpatient Prescriptions  Medication Sig Dispense Refill  . ALPRAZolam (XANAX) 0.5 MG tablet Take 1 tablet by mouth 2 (two) times daily as needed. Sleep  1  . aspirin 81 MG tablet Take 81 mg by mouth daily.    Marland Kitchen atorvastatin (LIPITOR) 20 MG tablet Take 0.5 tablets by mouth every other day.   11  . diltiazem (CARDIZEM) 30 MG tablet Take 30 mg by mouth every 6 (six) hours as needed. For hart racing    . enalapril (VASOTEC) 20 MG tablet Take 20 mg by mouth 2 (two) times daily.     Marland Kitchen enoxaparin (LOVENOX) 60 MG/0.6ML injection Inject 0.6 mLs (60 mg total) into the skin every 12 (twelve) hours. 6 Syringe 0  . HYDROcodone-acetaminophen (NORCO/VICODIN) 5-325 MG per tablet Take 1 tablet by mouth every 8 (eight) hours as needed. pain    . Magnesium 400 MG CAPS Take 1 capsule by mouth daily.    . metoprolol succinate (TOPROL-XL) 200 MG 24 hr tablet Take 1 tablet (200 mg  total) by mouth 2 (two) times daily. Take with or immediately following a meal. 60 tablet 0  . naphazoline-pheniramine (NAPHCON-A) 0.025-0.3 % ophthalmic solution Place 1 drop into both eyes daily as needed for irritation.    . nitrofurantoin (MACRODANTIN) 100 MG capsule Take 100 mg by mouth daily.    Marland Kitchen TIKOSYN 250 MCG capsule Take 1 capsule (250 mcg total) by mouth every 12 (twelve) hours. 180 capsule 3  . warfarin (COUMADIN) 5 MG tablet Take 2.5-5 mg by mouth as directed. Take 2.5mg  mon wed fridays and sundays whole tablet rest of days    . zolpidem (AMBIEN) 10 MG tablet Take 5 mg by mouth at bedtime as needed for sleep.    Marland Kitchen azelastine (ASTELIN) 0.1 % nasal spray Place 2 sprays into both nostrils 2 (two) times daily as needed for rhinitis or allergies. Use in each  nostril as directed    . nitroGLYCERIN (NITROSTAT) 0.4 MG SL tablet Place 1 tablet (0.4 mg total) under the tongue every 5 (five) minutes as needed for chest pain. 25 tablet 3   No current facility-administered medications for this encounter.    Allergies  Allergen Reactions  . Ciprofloxacin Other (See Comments)    tachycardia  . Escitalopram     "lips puffed", "tight in chest", "nasuea", "dizzy"  . Ranitidine Diarrhea    Social History   Social History  . Marital Status: Married    Spouse Name: N/A  . Number of Children: N/A  . Years of Education: N/A   Occupational History  . Not on file.   Social History Main Topics  . Smoking status: Never Smoker   . Smokeless tobacco: Not on file  . Alcohol Use: No  . Drug Use: No  . Sexual Activity: Not on file   Other Topics Concern  . Not on file   Social History Narrative   She is happily married. Her husband was with her today. She lives between [place] and [place].  Retired Advertising account planner.    Family History  Problem Relation Age of Onset  . Hypertension    . Stroke Neg Hx   . Diabetes type II Neg Hx   . Hypertension Daughter     ROS- All systems are reviewed and negative except as per the HPI above  Physical Exam: Filed Vitals:   06/19/15 1402  BP: 140/82  Pulse: 68  Height: 5\' 2"  (1.575 m)  Weight: 128 lb (58.06 kg)    GEN- The patient is well appearing, alert and oriented x 3 today.   Head- normocephalic, atraumatic Eyes-  Sclera clear, conjunctiva pink Ears- hearing intact Oropharynx- clear Neck- supple, no JVP Lymph- no cervical lymphadenopathy Lungs- Clear to ausculation bilaterally, normal work of breathing Heart- Regular rate and rhythm, no murmurs, rubs or gallops, PMI not laterally displaced GI- soft, NT, ND, + BS Extremities- no clubbing, cyanosis, or edema MS- no significant deformity or atrophy Skin- no rash or lesion Psych- euthymic mood, full affect Neuro- strength and sensation are  intact  EKG- NSR, v rate 68 bpm, pr int 150 ms, qrs int 90, qtc 476 ms. Epic records reiewed   Assessment and Plan: 1. Aflutter Back in SR Continue tikosyn Continue toprol, tolerates large dose of 200 mg bid If continues to fail drug may have to consider amiodarone Will need Kt rechecked within the week and pt would like drawn at PCP office and results faxed here Mag at good level for tiksoyn at 2.0 drwan 4/8  2. HTN  Stable  3. Recent sub therapeutic INR on lovenox INR today Will notify pt if can stop lovenox and fax result to PCP, who follows coumadin  4. Chronic nausea Will request GI consult at pt request  F/u 3 months  Geroge Baseman. Bessie Boyte, [facility] [address] [place], [place] [address] [phone]

## 2015-06-19 NOTE — Patient Instructions (Signed)
[facility] -- [place], [place], [place] [address]  Phone: [phone]

## 2015-06-20 ENCOUNTER — Other Ambulatory Visit (HOSPITAL_COMMUNITY): Payer: Self-pay | Admitting: *Deleted

## 2015-06-20 DIAGNOSIS — I4819 Other persistent atrial fibrillation: Secondary | ICD-10-CM

## 2015-07-04 ENCOUNTER — Telehealth: Payer: Self-pay | Admitting: Internal Medicine

## 2015-07-04 NOTE — Telephone Encounter (Signed)
Patient c/o Palpitations:  High priority if patient c/o lightheadedness and shortness of breath.  Pt was told by PCP to contact Dr Rayann Heman about pt going in to AF  1. How long have you been having palpitations? D/c from hosp 2 weeks ago, felt it yesterday 4/24 and today 4/25  2. Are you currently experiencing lightheadedness and shortness of breath? SoB  3. Have you checked your BP and heart rate? (document readings)  117- 4/25 am  4. Are you experiencing any other symptoms? weakness

## 2015-07-04 NOTE — Telephone Encounter (Signed)
Butch Penny will discuss patient options with Dr. Rayann Heman prior to her appt scheduled for 4/27 @ 11am. Patient states HR is better since taking PRN cardizem this morning. Encouraged her to take every 4 hrs as needed if her HR is >100 until seen on Thursday. States she was in afib Tuesday for about 6 hours as well.  PCP is supposed to fax EKG from their office today. Patient was in agreement of plan for appt on 4/27.

## 2015-07-04 NOTE — Telephone Encounter (Signed)
Spoke with pt. She reports she is going in and out of atrial fib.  Started yesterday. Heart rate speeds up and then slows down. Was at primary care today for coumadin check and EKG was done. This showed Atrial fib and they instructed her to contact Dr. Rayann Heman to schedule appt. Pt states heart rate was 117 this AM. She took prn Cardizem this AM.  Does not know what heart rate is at this time. Pt has been seen in Afib clinic several times and pt is willing to see them.  Will forward to Afib clinic regarding possible appt.

## 2015-07-06 ENCOUNTER — Encounter (HOSPITAL_COMMUNITY): Payer: Self-pay | Admitting: Nurse Practitioner

## 2015-07-06 ENCOUNTER — Ambulatory Visit (HOSPITAL_COMMUNITY)
Admission: RE | Admit: 2015-07-06 | Discharge: 2015-07-06 | Disposition: A | Discharge status: Home / Self Care | Payer: Medicare Other | Source: Ambulatory Visit | Attending: Nurse Practitioner | Admitting: Nurse Practitioner

## 2015-07-06 VITALS — BP 122/84 | HR 116 | Ht 62.0 in | Wt 125.4 lb

## 2015-07-06 DIAGNOSIS — I481 Persistent atrial fibrillation: Secondary | ICD-10-CM

## 2015-07-06 DIAGNOSIS — M316 Other giant cell arteritis: Secondary | ICD-10-CM | POA: Insufficient documentation

## 2015-07-06 DIAGNOSIS — E7800 Pure hypercholesterolemia, unspecified: Secondary | ICD-10-CM | POA: Diagnosis not present

## 2015-07-06 DIAGNOSIS — Z90710 Acquired absence of both cervix and uterus: Secondary | ICD-10-CM | POA: Diagnosis not present

## 2015-07-06 DIAGNOSIS — Z0189 Encounter for other specified special examinations: Secondary | ICD-10-CM | POA: Insufficient documentation

## 2015-07-06 DIAGNOSIS — I10 Essential (primary) hypertension: Secondary | ICD-10-CM | POA: Insufficient documentation

## 2015-07-06 DIAGNOSIS — Z7982 Long term (current) use of aspirin: Secondary | ICD-10-CM | POA: Diagnosis not present

## 2015-07-06 DIAGNOSIS — Z7901 Long term (current) use of anticoagulants: Secondary | ICD-10-CM | POA: Diagnosis not present

## 2015-07-06 DIAGNOSIS — R5383 Other fatigue: Secondary | ICD-10-CM | POA: Diagnosis not present

## 2015-07-06 DIAGNOSIS — R0600 Dyspnea, unspecified: Secondary | ICD-10-CM | POA: Diagnosis not present

## 2015-07-06 DIAGNOSIS — I4819 Other persistent atrial fibrillation: Secondary | ICD-10-CM

## 2015-07-06 DIAGNOSIS — I708 Atherosclerosis of other arteries: Secondary | ICD-10-CM | POA: Diagnosis not present

## 2015-07-06 DIAGNOSIS — Z8673 Personal history of transient ischemic attack (TIA), and cerebral infarction without residual deficits: Secondary | ICD-10-CM | POA: Insufficient documentation

## 2015-07-06 DIAGNOSIS — I4891 Unspecified atrial fibrillation: Secondary | ICD-10-CM | POA: Diagnosis not present

## 2015-07-06 LAB — COMPREHENSIVE METABOLIC PANEL
ALBUMIN: 3.7 g/dL (ref 3.5–5.0)
ALK PHOS: 71 U/L (ref 38–126)
ALT: 22 U/L (ref 14–54)
AST: 32 U/L (ref 15–41)
Anion gap: 10 (ref 5–15)
BUN: 13 mg/dL (ref 6–20)
CALCIUM: 9.6 mg/dL (ref 8.9–10.3)
CHLORIDE: 105 mmol/L (ref 101–111)
CO2: 24 mmol/L (ref 22–32)
CREATININE: 0.94 mg/dL (ref 0.44–1.00)
GFR calc Af Amer: >60 mL/min (ref >60)
GFR calc non Af Amer: 54 mL/min — ABNORMAL LOW (ref >60)
GLUCOSE: 146 mg/dL — AB (ref 65–99)
Potassium: 4.3 mmol/L (ref 3.5–5.1)
SODIUM: 139 mmol/L (ref 135–145)
Total Bilirubin: 1.4 mg/dL — ABNORMAL HIGH (ref 0.3–1.2)
Total Protein: 7 g/dL (ref 6.5–8.1)

## 2015-07-06 LAB — TSH: TSH: 0.908 u[IU]/mL (ref 0.350–4.500)

## 2015-07-07 ENCOUNTER — Encounter (HOSPITAL_COMMUNITY): Payer: Self-pay | Admitting: Nurse Practitioner

## 2015-07-07 NOTE — Progress Notes (Signed)
Patient ID: ZALEAH HUETTER, female   DOB: 1929-11-06, 82 y.o.   MRN: [id number]        Primary Care Physician: Raelene Bott, MD Referring Physician: Dr. Sabino Donovan is a 82 y.o. female with a h/o persistent afib on tikosyn. She reports that her brand tikosyn was substituted for generic afib and within a few days she went into afib and stayed in afib x 5 days. It was only after being on brand tikosyn for 2 days she returned  to NSR, and has requested to stay on brand tikosyn. She continues on warfarin.  She presented to [facility] ER with c/o palpitations, near syncope and nausea, and was admitted 4/7 thru 4/9. Dicussed cardioversion but her INR was sub therapeutic at 1.8. She felt better the next day and wished not to stay for cardioversion but d/c home. She had increas in PVC's while in hospital and toprol was increased to 200 mg bid. Tikosyn continued. She believes she went back in rhythm sometime yesterday.She feels well today and will have her INR checked today, she continues on Lovenox shots. States that she has chronic nausea for years, no vomiting and would like to see a GI specialist.  Pt asked to be seen today due to increased afib burden over the last few weeks. She feels very fatigued. She continues on tikosyn and one option would to be to switch her over to amiodarone, but review of her echo 8/16 shows structural disease and her valve situation could be worse driving the afib. She is agreeable to look at repeat echo and then decide on plan going forward.  Today, she denies symptoms of palpitations, chest pain, shortness of breath, orthopnea, PND, lower extremity edema, dizziness, presyncope, syncope, or neurologic sequela. The patient is tolerating medications without difficulties and is otherwise without complaint today.   Past Medical History  Diagnosis Date  . Atrial fibrillation (HCC)     persistent, coumadin per PCP;  echo 06/06/09: EF 60%, mild LVH, Gr 1 diast dysfx, AV  gradient mean 9, mod MR, mod LAE;  Myoview 11/09: normal, EF 81%  . TIA (transient ischemic attack)   . CVA (cerebral vascular accident) ([place])     2006  . HTN (hypertension)   . Fatigue   . Dyspnea   . Giant cell arteritis [facility])     sees dr. at [facility]  . Back pain   . H/O: hysterectomy   . Subclavian artery stenosis, right     dopplers done 2/12;  carotids 2/12: 0-39% bilateral  . Hypercholesteremia     Secondary to vascular disease  . Cancer [facility])    Past Surgical History  Procedure Laterality Date  . Knee surgery  3/06    Left  . Appendectomy    . Abdominal hysterectomy    . Prolapsed uterine fibroid ligation  7/05    Current Outpatient Prescriptions  Medication Sig Dispense Refill  . ALPRAZolam (XANAX) 0.5 MG tablet Take 1 tablet by mouth 2 (two) times daily as needed. Sleep  1  . aspirin 81 MG tablet Take 81 mg by mouth daily.    Marland Kitchen atorvastatin (LIPITOR) 20 MG tablet Take 0.5 tablets by mouth every other day.   11  . azelastine (ASTELIN) 0.1 % nasal spray Place 2 sprays into both nostrils 2 (two) times daily as needed for rhinitis or allergies. Use in each nostril as directed    . diltiazem (CARDIZEM) 30 MG tablet Take 30 mg by mouth every  6 (six) hours as needed. For hart racing    . enalapril (VASOTEC) 20 MG tablet Take 20 mg by mouth 2 (two) times daily.     Marland Kitchen HYDROcodone-acetaminophen (NORCO/VICODIN) 5-325 MG per tablet Take 1 tablet by mouth every 8 (eight) hours as needed. pain    . Magnesium 400 MG CAPS Take 1 capsule by mouth daily.    . metoprolol succinate (TOPROL-XL) 200 MG 24 hr tablet Take 1 tablet (200 mg total) by mouth 2 (two) times daily. Take with or immediately following a meal. 60 tablet 0  . naphazoline-pheniramine (NAPHCON-A) 0.025-0.3 % ophthalmic solution Place 1 drop into both eyes daily as needed for irritation.    . nitrofurantoin (MACRODANTIN) 100 MG capsule Take 100 mg by mouth daily.    Marland Kitchen TIKOSYN 250 MCG capsule Take 1 capsule (250 mcg total) by  mouth every 12 (twelve) hours. 180 capsule 3  . warfarin (COUMADIN) 5 MG tablet Take 2.5-5 mg by mouth as directed. Take 2.5mg  mon wed fridays and sundays whole tablet rest of days    . zolpidem (AMBIEN) 10 MG tablet Take 5 mg by mouth at bedtime as needed for sleep.    . nitroGLYCERIN (NITROSTAT) 0.4 MG SL tablet Place 1 tablet (0.4 mg total) under the tongue every 5 (five) minutes as needed for chest pain. 25 tablet 3   No current facility-administered medications for this encounter.    Allergies  Allergen Reactions  . Ciprofloxacin Other (See Comments)    tachycardia  . Escitalopram     "lips puffed", "tight in chest", "nasuea", "dizzy"  . Ranitidine Diarrhea    Social History   Social History  . Marital Status: Married    Spouse Name: N/A  . Number of Children: N/A  . Years of Education: N/A   Occupational History  . Not on file.   Social History Main Topics  . Smoking status: Never Smoker   . Smokeless tobacco: Not on file  . Alcohol Use: No  . Drug Use: No  . Sexual Activity: Not on file   Other Topics Concern  . Not on file   Social History Narrative   She is happily married. Her husband was with her today. She lives between [place] and [place].  Retired Advertising account planner.    Family History  Problem Relation Age of Onset  . Hypertension    . Stroke Neg Hx   . Diabetes type II Neg Hx   . Hypertension Daughter     ROS- All systems are reviewed and negative except as per the HPI above  Physical Exam: Filed Vitals:   07/06/15 1134  BP: 122/84  Pulse: 116  Height: 5\' 2"  (1.575 m)  Weight: 125 lb 6.4 oz (56.881 kg)    GEN- The patient is well appearing, alert and oriented x 3 today.   Head- normocephalic, atraumatic Eyes-  Sclera clear, conjunctiva pink Ears- hearing intact Oropharynx- clear Neck- supple, no JVP Lymph- no cervical lymphadenopathy Lungs- Clear to ausculation bilaterally, normal work of breathing Heart- Irregular rate and rhythm,   2/6 systolic murmur, no rubs or gallops, PMI not laterally displaced GI- soft, NT, ND, + BS Extremities- no clubbing, cyanosis, or edema MS- no significant deformity or atrophy Skin- no rash or lesion Psych- euthymic mood, full affect Neuro- strength and sensation are intact  EKG- afib at 116 bpm, qrs int 68 ms, qtc 500 ms Epic records reiewed Echo-- Left ventricle: The cavity size was normal. There was mild focal  basal hypertrophy of the septum. Systolic function was vigorous.  The estimated ejection fraction was in the range of 65% to 70%.  Wall motion was normal; there were no regional wall motion  abnormalities. Features are consistent with a pseudonormal left  ventricular filling pattern, with concomitant abnormal relaxation  and increased filling pressure (grade 2 diastolic dysfunction).  Doppler parameters are consistent with elevated ventricular  end-diastolic filling pressure. - Aortic valve: Trileaflet; moderately thickened, moderately  calcified leaflets. Valve mobility was restricted. There was mild  to moderate stenosis. There was mild to moderate regurgitation.  Mean gradient (S): 21 mm Hg. - Aortic root: The aortic root was normal in size. - Mitral valve: Calcified annulus. Moderately thickened, mildly  calcified leaflets . There was moderate regurgitation directed  centrally and posteriorly. - Left atrium: The atrium was severely dilated. - Right ventricle: The cavity size was normal. Wall thickness was  normal. Systolic function was normal. - Right atrium: The atrium was mildly dilated. - Tricuspid valve: There was mild regurgitation. - Pulmonary arteries: Systolic pressure was at the upper limits of  normal. PA peak pressure: 35 mm Hg (S). - Inferior vena cava: The vessel was normal in size. - Pericardium, extracardiac: There was no pericardial effusion.   Assessment and Plan: 1. Recent Increased afib burden Continue tikosyn for now but with  consideration to change to amiodarone Continue toprol, tolerates large dose of 200 mg bid Structural heart disease on last echo which may have worsened  driving increased afib burden  Repeat echo before decisions regarding antiarrythmic  2. HTN  Stable  Will call pt with further instructions when results of echo are known    Butch Penny C. Carroll, [facility] [address] [place], [place] [address] [phone]

## 2015-07-13 ENCOUNTER — Ambulatory Visit (HOSPITAL_COMMUNITY)
Admission: RE | Admit: 2015-07-13 | Discharge: 2015-07-13 | Disposition: A | Discharge status: Home / Self Care | Payer: Medicare Other | Source: Ambulatory Visit | Attending: Nurse Practitioner | Admitting: Nurse Practitioner

## 2015-07-13 DIAGNOSIS — I119 Hypertensive heart disease without heart failure: Secondary | ICD-10-CM | POA: Insufficient documentation

## 2015-07-13 DIAGNOSIS — I340 Nonrheumatic mitral (valve) insufficiency: Secondary | ICD-10-CM | POA: Diagnosis not present

## 2015-07-13 DIAGNOSIS — I4819 Other persistent atrial fibrillation: Secondary | ICD-10-CM

## 2015-07-13 DIAGNOSIS — I481 Persistent atrial fibrillation: Secondary | ICD-10-CM | POA: Diagnosis not present

## 2015-07-13 DIAGNOSIS — I4891 Unspecified atrial fibrillation: Secondary | ICD-10-CM | POA: Diagnosis present

## 2015-07-13 DIAGNOSIS — I352 Nonrheumatic aortic (valve) stenosis with insufficiency: Secondary | ICD-10-CM | POA: Diagnosis not present

## 2015-07-13 NOTE — Progress Notes (Signed)
*  PRELIMINARY RESULTS* Echocardiogram 2D Echocardiogram has been performed.  [name] [name] 07/13/2015, 2:11 PM

## 2015-07-17 ENCOUNTER — Encounter (HOSPITAL_COMMUNITY): Payer: Self-pay | Admitting: Nurse Practitioner

## 2015-07-17 ENCOUNTER — Ambulatory Visit (HOSPITAL_COMMUNITY)
Admission: RE | Admit: 2015-07-17 | Discharge: 2015-07-17 | Disposition: A | Discharge status: Home / Self Care | Payer: Medicare Other | Source: Ambulatory Visit | Attending: Nurse Practitioner | Admitting: Nurse Practitioner

## 2015-07-17 VITALS — BP 118/82 | HR 90 | Ht 62.0 in | Wt 123.2 lb

## 2015-07-17 DIAGNOSIS — M549 Dorsalgia, unspecified: Secondary | ICD-10-CM | POA: Diagnosis not present

## 2015-07-17 DIAGNOSIS — Z90710 Acquired absence of both cervix and uterus: Secondary | ICD-10-CM | POA: Diagnosis not present

## 2015-07-17 DIAGNOSIS — R0600 Dyspnea, unspecified: Secondary | ICD-10-CM | POA: Diagnosis not present

## 2015-07-17 DIAGNOSIS — Z8673 Personal history of transient ischemic attack (TIA), and cerebral infarction without residual deficits: Secondary | ICD-10-CM | POA: Insufficient documentation

## 2015-07-17 DIAGNOSIS — M316 Other giant cell arteritis: Secondary | ICD-10-CM | POA: Diagnosis not present

## 2015-07-17 DIAGNOSIS — R110 Nausea: Secondary | ICD-10-CM | POA: Diagnosis not present

## 2015-07-17 DIAGNOSIS — R002 Palpitations: Secondary | ICD-10-CM | POA: Diagnosis not present

## 2015-07-17 DIAGNOSIS — I481 Persistent atrial fibrillation: Secondary | ICD-10-CM | POA: Diagnosis present

## 2015-07-17 DIAGNOSIS — Z0189 Encounter for other specified special examinations: Secondary | ICD-10-CM | POA: Insufficient documentation

## 2015-07-17 DIAGNOSIS — I10 Essential (primary) hypertension: Secondary | ICD-10-CM | POA: Diagnosis not present

## 2015-07-17 DIAGNOSIS — R5383 Other fatigue: Secondary | ICD-10-CM | POA: Insufficient documentation

## 2015-07-17 DIAGNOSIS — E7800 Pure hypercholesterolemia, unspecified: Secondary | ICD-10-CM | POA: Insufficient documentation

## 2015-07-17 DIAGNOSIS — I4819 Other persistent atrial fibrillation: Secondary | ICD-10-CM

## 2015-07-17 DIAGNOSIS — R55 Syncope and collapse: Secondary | ICD-10-CM | POA: Diagnosis not present

## 2015-07-17 DIAGNOSIS — Z7982 Long term (current) use of aspirin: Secondary | ICD-10-CM | POA: Diagnosis not present

## 2015-07-17 MED ORDER — AMIODARONE HCL 200 MG PO TABS: 200.0000 mg | ORAL_TABLET | Freq: Two times a day (BID) | ORAL | Status: DC

## 2015-07-17 MED ORDER — METOPROLOL SUCCINATE ER 200 MG PO TB24: 100.0000 mg | ORAL_TABLET | Freq: Two times a day (BID) | ORAL | Status: DC

## 2015-07-17 NOTE — Progress Notes (Signed)
Patient ID: [name] [name], female   DOB: 1930/01/22, 82 y.o.   MRN: [id number]        Primary Care Physician: Raelene Bott, MD Referring Physician: Dr. Sabino Donovan is a 82 y.o. female with a h/o persistent afib on tikosyn. She reports that her brand tikosyn was substituted for generic afib and within a few days she went into afib and stayed in afib x 5 days. It was only after being on brand tikosyn for 2 days she returned  to NSR, and has requested to stay on brand tikosyn. She continues on warfarin.  She presented to [facility] ER with c/o palpitations, near syncope and nausea, and was admitted 4/7 thru 4/9. Dicussed cardioversion but her INR was sub therapeutic at 1.8. She felt better the next day and wished not to stay for cardioversion but d/c home. She had increas in PVC's while in hospital and toprol was increased to 200 mg bid. Tikosyn continued. She believes she went back in rhythm sometime yesterday.She feels well today and will have her INR checked today, she continues on Lovenox shots. States that she has chronic nausea for years, no vomiting and would like to see a GI specialist.  Pt asked to be seen 4/27, due to increased afib burden over the last few weeks. She feels very fatigued. She continues on tikosyn and one option would to be to switch her over to amiodarone, but review of her echo 8/16 shows structural disease and her valve situation could be worse driving the afib. She is agreeable to look at repeat echo and then decide on plan going forward.  Returns 5/28, echo performed and valve status stable, but presents in afib and c/o of being weak in afib. Per discussion with Dr. Rayann Heman last week if Tikosyn not keeping in rhythm, then stop drug and try amiodarone load. She has been off Tikosyn since Friday am so will start 200 mg amiodarone  this pm and load at 200 mg bid. She has been made aware that addition of amiodarone will thin the blood and has been asked to have her INR  checked this Friday and EKG done and will fax results. In anticipation of needed DCCV with amiodarone load will ask for INR's to be done weekly for the next month and faxed here. It is more convenient to have done at [facility] office in [facility] than arrange transportation here.   Today, she denies symptoms of palpitations, chest pain, shortness of breath, orthopnea, PND, lower extremity edema, dizziness, presyncope, syncope, or neurologic sequela. The patient is tolerating medications without difficulties and is otherwise without complaint today.   Past Medical History  Diagnosis Date  . Atrial fibrillation (HCC)     persistent, coumadin per PCP;  echo 06/06/09: EF 60%, mild LVH, Gr 1 diast dysfx, AV gradient mean 9, mod MR, mod LAE;  Myoview 11/09: normal, EF 81%  . TIA (transient ischemic attack)   . CVA (cerebral vascular accident) ([place])     2006  . HTN (hypertension)   . Fatigue   . Dyspnea   . Giant cell arteritis [facility])     sees dr. at [facility]  . Back pain   . H/O: hysterectomy   . Subclavian artery stenosis, right     dopplers done 2/12;  carotids 2/12: 0-39% bilateral  . Hypercholesteremia     Secondary to vascular disease  . Cancer [facility])    Past Surgical History  Procedure Laterality Date  . Knee  surgery  3/06    Left  . Appendectomy    . Abdominal hysterectomy    . Prolapsed uterine fibroid ligation  7/05    Current Outpatient Prescriptions  Medication Sig Dispense Refill  . ALPRAZolam (XANAX) 0.5 MG tablet Take 1 tablet by mouth 2 (two) times daily as needed. Sleep  1  . amiodarone (PACERONE) 200 MG tablet Take 1 tablet (200 mg total) by mouth 2 (two) times daily. 60 tablet 3  . aspirin 81 MG tablet Take 81 mg by mouth daily.    Marland Kitchen atorvastatin (LIPITOR) 20 MG tablet Take 0.5 tablets by mouth every other day.   11  . azelastine (ASTELIN) 0.1 % nasal spray Place 2 sprays into both nostrils 2 (two) times daily as needed for rhinitis or allergies. Use in each nostril as  directed    . diltiazem (CARDIZEM) 30 MG tablet Take 30 mg by mouth every 6 (six) hours as needed. For hart racing    . enalapril (VASOTEC) 20 MG tablet Take 20 mg by mouth 2 (two) times daily.     Marland Kitchen HYDROcodone-acetaminophen (NORCO/VICODIN) 5-325 MG per tablet Take 1 tablet by mouth every 8 (eight) hours as needed. pain    . Magnesium 400 MG CAPS Take 1 capsule by mouth daily.    . metoprolol (TOPROL-XL) 200 MG 24 hr tablet Take 0.5 tablets (100 mg total) by mouth 2 (two) times daily. Take with or immediately following a meal. 60 tablet 0  . naphazoline-pheniramine (NAPHCON-A) 0.025-0.3 % ophthalmic solution Place 1 drop into both eyes daily as needed for irritation.    . nitrofurantoin (MACRODANTIN) 100 MG capsule Take 100 mg by mouth daily.    . nitroGLYCERIN (NITROSTAT) 0.4 MG SL tablet Place 1 tablet (0.4 mg total) under the tongue every 5 (five) minutes as needed for chest pain. 25 tablet 3  . warfarin (COUMADIN) 5 MG tablet Take 2.5-5 mg by mouth as directed. Take 2.5mg  mon wed fridays and sundays whole tablet rest of days    . zolpidem (AMBIEN) 10 MG tablet Take 5 mg by mouth at bedtime as needed for sleep.     No current facility-administered medications for this encounter.    Allergies  Allergen Reactions  . Ciprofloxacin Other (See Comments)    tachycardia  . Escitalopram     "lips puffed", "tight in chest", "nasuea", "dizzy"  . Ranitidine Diarrhea    Social History   Social History  . Marital Status: Widowed    Spouse Name: N/A  . Number of Children: N/A  . Years of Education: N/A   Occupational History  . Not on file.   Social History Main Topics  . Smoking status: Never Smoker   . Smokeless tobacco: Not on file  . Alcohol Use: No  . Drug Use: No  . Sexual Activity: Not on file   Other Topics Concern  . Not on file   Social History Narrative   She is happily married. Her husband was with her today. She lives between [place] and [place].  Retired Physiological scientist.    Family History  Problem Relation Age of Onset  . Hypertension    . Stroke Neg Hx   . Diabetes type II Neg Hx   . Hypertension Daughter     ROS- All systems are reviewed and negative except as per the HPI above  Physical Exam: Filed Vitals:   07/17/15 1114  BP: 118/82  Pulse: 90  Height: 5\' 2"  (1.575 m)  Weight: 123 lb 3.2 oz (55.883 kg)    GEN- The patient is well appearing, alert and oriented x 3 today.   Head- normocephalic, atraumatic Eyes-  Sclera clear, conjunctiva pink Ears- hearing intact Oropharynx- clear Neck- supple, no JVP Lymph- no cervical lymphadenopathy Lungs- Clear to ausculation bilaterally, normal work of breathing Heart- Irregular rate and rhythm,  2/6 systolic murmur, no rubs or gallops, PMI not laterally displaced GI- soft, NT, ND, + BS Extremities- no clubbing, cyanosis, or edema MS- no significant deformity or atrophy Skin- no rash or lesion Psych- euthymic mood, full affect Neuro- strength and sensation are intact  EKG- afib at 90 bpm, qrs int 74 ms, qtc 450 ms Epic records reiewed Echo--  Left ventricle: The cavity size was normal. There was mild focal  basal hypertrophy of the septum. Systolic function was normal.  The estimated ejection fraction was in the range of 55% to 60%.  Wall motion was normal; there were no regional wall motion  abnormalities. The study is not technically sufficient to allow  evaluation of LV diastolic function. - Aortic valve: Valve mobility was restricted. There was very mild  stenosis. There was mild regurgitation. Peak velocity (S): 241  cm/s. Mean gradient (S): 13 mm Hg. - Mitral valve: Moderately calcified annulus. There was mild to  moderate regurgitation directed posteriorly. - Left atrium: The atrium was mildly dilated.  Impressions:  - Compared to the prior study, there has been no significant  interval change.    Assessment and Plan: 1. Recent Increased afib  burden Tikosyn has been stopped since 5/4 8am and will start amiodarone 200 mg bid tonight  Decrease metoprolol to 100 mg bid with addition of amiodarone Structural heart disease stable Check INR on Friday with PCP and have faxed here, will need weekly therapeutic  INR's, with plans to cardiovert in 3-4 weeks Asked also to check EKG at PCP and fax here on Friday as well  will pan to recheck thyroid, liver and pulmonary studies in the near future Baseline thyroid/liver have previoulsy been done  2. HTN  Stable  F/u in two weeks    Butch Penny C. Carroll, [facility] [address] [place], [place] [address] [phone]

## 2015-07-17 NOTE — Patient Instructions (Signed)
Your physician has recommended you make the following change in your medication:  1)Decrease metoprolol to 100mg  twice a day 2)Amiodarone 200mg  twice a day  Fax results of INR and EKG to [phone]

## 2015-07-18 ENCOUNTER — Other Ambulatory Visit (HOSPITAL_COMMUNITY): Payer: Self-pay | Admitting: *Deleted

## 2015-07-18 DIAGNOSIS — I4819 Other persistent atrial fibrillation: Secondary | ICD-10-CM

## 2015-07-19 ENCOUNTER — Ambulatory Visit: Payer: Medicare Other | Admitting: Gastroenterology

## 2015-07-31 ENCOUNTER — Ambulatory Visit (HOSPITAL_COMMUNITY)
Admission: RE | Admit: 2015-07-31 | Discharge: 2015-07-31 | Disposition: A | Discharge status: Home / Self Care | Payer: Medicare Other | Source: Ambulatory Visit | Attending: Nurse Practitioner | Admitting: Nurse Practitioner

## 2015-07-31 ENCOUNTER — Encounter (HOSPITAL_COMMUNITY): Payer: Self-pay | Admitting: Nurse Practitioner

## 2015-07-31 ENCOUNTER — Other Ambulatory Visit (HOSPITAL_COMMUNITY): Payer: Self-pay | Admitting: *Deleted

## 2015-07-31 VITALS — BP 122/60 | HR 87 | Ht 62.0 in | Wt 123.0 lb

## 2015-07-31 DIAGNOSIS — Z9889 Other specified postprocedural states: Secondary | ICD-10-CM | POA: Diagnosis not present

## 2015-07-31 DIAGNOSIS — R9431 Abnormal electrocardiogram [ECG] [EKG]: Secondary | ICD-10-CM | POA: Insufficient documentation

## 2015-07-31 DIAGNOSIS — Z8249 Family history of ischemic heart disease and other diseases of the circulatory system: Secondary | ICD-10-CM | POA: Insufficient documentation

## 2015-07-31 DIAGNOSIS — Z888 Allergy status to other drugs, medicaments and biological substances status: Secondary | ICD-10-CM | POA: Diagnosis not present

## 2015-07-31 DIAGNOSIS — Z90710 Acquired absence of both cervix and uterus: Secondary | ICD-10-CM | POA: Insufficient documentation

## 2015-07-31 DIAGNOSIS — Z859 Personal history of malignant neoplasm, unspecified: Secondary | ICD-10-CM | POA: Insufficient documentation

## 2015-07-31 DIAGNOSIS — Z792 Long term (current) use of antibiotics: Secondary | ICD-10-CM | POA: Insufficient documentation

## 2015-07-31 DIAGNOSIS — I481 Persistent atrial fibrillation: Secondary | ICD-10-CM | POA: Diagnosis present

## 2015-07-31 DIAGNOSIS — Z823 Family history of stroke: Secondary | ICD-10-CM | POA: Insufficient documentation

## 2015-07-31 DIAGNOSIS — Z7982 Long term (current) use of aspirin: Secondary | ICD-10-CM | POA: Diagnosis not present

## 2015-07-31 DIAGNOSIS — Z8673 Personal history of transient ischemic attack (TIA), and cerebral infarction without residual deficits: Secondary | ICD-10-CM | POA: Diagnosis not present

## 2015-07-31 DIAGNOSIS — I10 Essential (primary) hypertension: Secondary | ICD-10-CM | POA: Insufficient documentation

## 2015-07-31 DIAGNOSIS — Z79899 Other long term (current) drug therapy: Secondary | ICD-10-CM | POA: Insufficient documentation

## 2015-07-31 DIAGNOSIS — E7800 Pure hypercholesterolemia, unspecified: Secondary | ICD-10-CM | POA: Diagnosis not present

## 2015-07-31 DIAGNOSIS — I4430 Unspecified atrioventricular block: Secondary | ICD-10-CM | POA: Insufficient documentation

## 2015-07-31 DIAGNOSIS — Z833 Family history of diabetes mellitus: Secondary | ICD-10-CM | POA: Diagnosis not present

## 2015-07-31 DIAGNOSIS — Z7901 Long term (current) use of anticoagulants: Secondary | ICD-10-CM | POA: Diagnosis not present

## 2015-07-31 DIAGNOSIS — I4819 Other persistent atrial fibrillation: Secondary | ICD-10-CM

## 2015-07-31 NOTE — Progress Notes (Signed)
Patient ID: [name] [name], female   DOB: 04/28/1929, 82 y.o.   MRN: [id number]        Primary Care Physician: Raelene Bott, MD Referring Physician: Dr. Sabino Donovan is a 82 y.o. female with a h/o persistent afib on tikosyn. She reports that her brand tikosyn was substituted for generic afib and within a few days she went into afib and stayed in afib x 5 days. It was only after being on brand tikosyn for 2 days she returned  to NSR, and has requested to stay on brand tikosyn. She continues on warfarin.  She presented to [facility] ER with c/o palpitations, near syncope and nausea, and was admitted 4/7 thru 4/9. Dicussed cardioversion but her INR was sub therapeutic at 1.8. She felt better the next day and wished not to stay for cardioversion but d/c home. She had increas in PVC's while in hospital and toprol was increased to 200 mg bid. Tikosyn continued. She believes she went back in rhythm sometime yesterday.She feels well today and will have her INR checked today, she continues on Lovenox shots. States that she has chronic nausea for years, no vomiting and would like to see a GI specialist.  Pt asked to be seen 4/27, due to increased afib burden over the last few weeks. She feels very fatigued. She continues on tikosyn and one option would to be to switch her over to amiodarone, but review of her echo 8/16 shows structural disease and her valve situation could be worse driving the afib. She is agreeable to look at repeat echo and then decide on plan going forward.  Returns 5/28, echo performed and valve status stable, but presents in afib and c/o of being weak in afib. Per discussion with Dr. Rayann Heman last week if Tikosyn not keeping in rhythm, then stop drug and try amiodarone load. She has been off Tikosyn since Friday am so will start 200 mg amiodarone  this pm and load at 200 mg bid. She has been made aware that addition of amiodarone will thin the blood and has been asked to have her INR  checked this Friday and EKG done and will fax results. In anticipation of needed DCCV with amiodarone load will ask for INR's to be done weekly for the next month and faxed here. It is more convenient to have done at [facility] office in [facility] than arrange transportation here.   She returns 5/22 after loading 2 weeks on amiodarone. She feels better on drug. Is in aflutter, rate controlled. INR's have been therapeutic at PCP office and will continue to have drawn weekly. Will go ahead and schedule DCCV for 6/9 pending therapeutic INR's. Since decreasing metoprolol dose with addition of  Amiodarone, her nausea has improved.  Today, she denies symptoms of palpitations, chest pain, shortness of breath, orthopnea, PND, lower extremity edema, dizziness, presyncope, syncope, or neurologic sequela. The patient is tolerating medications without difficulties and is otherwise without complaint today.   Past Medical History  Diagnosis Date  . Atrial fibrillation (HCC)     persistent, coumadin per PCP;  echo 06/06/09: EF 60%, mild LVH, Gr 1 diast dysfx, AV gradient mean 9, mod MR, mod LAE;  Myoview 11/09: normal, EF 81%  . TIA (transient ischemic attack)   . CVA (cerebral vascular accident) ([place])     2006  . HTN (hypertension)   . Fatigue   . Dyspnea   . Giant cell arteritis [facility])     sees dr.  at [facility]  . Back pain   . H/O: hysterectomy   . Subclavian artery stenosis, right     dopplers done 2/12;  carotids 2/12: 0-39% bilateral  . Hypercholesteremia     Secondary to vascular disease  . Cancer [facility])    Past Surgical History  Procedure Laterality Date  . Knee surgery  3/06    Left  . Appendectomy    . Abdominal hysterectomy    . Prolapsed uterine fibroid ligation  7/05    Current Outpatient Prescriptions  Medication Sig Dispense Refill  . ALPRAZolam (XANAX) 0.5 MG tablet Take 1 tablet by mouth 2 (two) times daily as needed. Sleep  1  . amiodarone (PACERONE) 200 MG tablet Take 1 tablet (200 mg  total) by mouth 2 (two) times daily. 60 tablet 3  . aspirin 81 MG tablet Take 81 mg by mouth daily.    Marland Kitchen atorvastatin (LIPITOR) 20 MG tablet Take 0.5 tablets by mouth every other day.   11  . azelastine (ASTELIN) 0.1 % nasal spray Place 2 sprays into both nostrils 2 (two) times daily as needed for rhinitis or allergies. Use in each nostril as directed    . diltiazem (CARDIZEM) 30 MG tablet Take 30 mg by mouth every 6 (six) hours as needed. For hart racing    . enalapril (VASOTEC) 20 MG tablet Take 20 mg by mouth 2 (two) times daily.     Marland Kitchen HYDROcodone-acetaminophen (NORCO/VICODIN) 5-325 MG per tablet Take 1 tablet by mouth every 8 (eight) hours as needed. pain    . Magnesium 400 MG CAPS Take 1 capsule by mouth daily.    . metoprolol (TOPROL-XL) 200 MG 24 hr tablet Take 0.5 tablets (100 mg total) by mouth 2 (two) times daily. Take with or immediately following a meal. 60 tablet 0  . naphazoline-pheniramine (NAPHCON-A) 0.025-0.3 % ophthalmic solution Place 1 drop into both eyes daily as needed for irritation.    . nitrofurantoin (MACRODANTIN) 100 MG capsule Take 100 mg by mouth daily.    Marland Kitchen warfarin (COUMADIN) 5 MG tablet Take 2.5-5 mg by mouth as directed. Take 2.5mg  mon wed fridays and sundays whole tablet rest of days    . zolpidem (AMBIEN) 10 MG tablet Take 5 mg by mouth at bedtime as needed for sleep.    . nitroGLYCERIN (NITROSTAT) 0.4 MG SL tablet Place 1 tablet (0.4 mg total) under the tongue every 5 (five) minutes as needed for chest pain. 25 tablet 3   No current facility-administered medications for this encounter.    Allergies  Allergen Reactions  . Ciprofloxacin Other (See Comments)    tachycardia  . Escitalopram     "lips puffed", "tight in chest", "nasuea", "dizzy"  . Ranitidine Diarrhea    Social History   Social History  . Marital Status: Widowed    Spouse Name: N/A  . Number of Children: N/A  . Years of Education: N/A   Occupational History  . Not on file.   Social  History Main Topics  . Smoking status: Never Smoker   . Smokeless tobacco: Not on file  . Alcohol Use: No  . Drug Use: No  . Sexual Activity: Not on file   Other Topics Concern  . Not on file   Social History Narrative   She is happily married. Her husband was with her today. She lives between [place] and [place].  Retired Advertising account planner.    Family History  Problem Relation Age of Onset  . Hypertension    .  Stroke Neg Hx   . Diabetes type II Neg Hx   . Hypertension Daughter     ROS- All systems are reviewed and negative except as per the HPI above  Physical Exam: Filed Vitals:   07/31/15 1335  BP: 122/60  Pulse: 87  Height: 5\' 2"  (1.575 m)  Weight: 123 lb (55.792 kg)    GEN- The patient is well appearing, alert and oriented x 3 today.   Head- normocephalic, atraumatic Eyes-  Sclera clear, conjunctiva pink Ears- hearing intact Oropharynx- clear Neck- supple, no JVP Lymph- no cervical lymphadenopathy Lungs- Clear to ausculation bilaterally, normal work of breathing Heart- Irregular rate and rhythm,  2/6 systolic murmur, no rubs or gallops, PMI not laterally displaced GI- soft, NT, ND, + BS Extremities- no clubbing, cyanosis, or edema MS- no significant deformity or atrophy Skin- no rash or lesion Psych- euthymic mood, full affect Neuro- strength and sensation are intact  EKG- aflutter with variable AV block, v rate at 87 bpm, qrs int 94 ms, qtc 469 ms Epic records reiewed Echo--  Left ventricle: The cavity size was normal. There was mild focal  basal hypertrophy of the septum. Systolic function was normal.  The estimated ejection fraction was in the range of 55% to 60%.  Wall motion was normal; there were no regional wall motion  abnormalities. The study is not technically sufficient to allow  evaluation of LV diastolic function. - Aortic valve: Valve mobility was restricted. There was very mild  stenosis. There was mild regurgitation. Peak velocity  (S): 241  cm/s. Mean gradient (S): 13 mm Hg. - Mitral valve: Moderately calcified annulus. There was mild to  moderate regurgitation directed posteriorly. - Left atrium: The atrium was mildly dilated.  Impressions:  - Compared to the prior study, there has been no significant  interval change.    Assessment and Plan: 1. Recent Increased afib burden Tikosyn has been stopped since 5/4 8am and  Amiodarone started  200 mg 5/8   Decrease metoprolol to 100 mg bid with addition of amiodarone Structural heart disease stable Check INR weekly with PCP and have faxed here, will need weekly therapeutic  INR's, with plans to cardiovert in 6/9 Will pan to recheck thyroid, liver and pulmonary studies in the near future Baseline thyroid/liver have previoulsy been done  2. HTN  Stable  F/u one week after cardioversion    [name] [name], [facility] [address] [place], [place] [address] [phone]

## 2015-07-31 NOTE — Patient Instructions (Addendum)
Cardioversion scheduled for Friday, June 9th  - Arrive at the [facility] and go to admitting at [facility] not eat or drink anything after midnight the night prior to your procedure.  - Take all your medication with a sip of water prior to arrival.  - You will not be able to drive home after your procedure.  Have INR drawn next week along with CBC, BMET -- have results (including last 3 weeks of INRs) faxed to [phone]   Decrease Amiodarone to 200mg  once a day after the cardioversion

## 2015-08-15 ENCOUNTER — Telehealth (HOSPITAL_COMMUNITY): Payer: Self-pay | Admitting: *Deleted

## 2015-08-15 NOTE — Telephone Encounter (Signed)
DCCV scheduled for 6/9 with Dr. Meda Coffee --   Patient had labs/inrs checked at PCP office. See results below. 08/04/15 - CBC within normal limits. Hgb 13.7/Hct 43.1  - BMET  Within normal limits - Potassium 4.6, Sodium 138, Creatinine 0.97, BUN 13, Glucose 171  07/21/15 - INR 3.7 08/04/15 - INR 2.5 08/10/15- INR 4.1 08/15/15 - INR 2.1  If copies of results needed please call Harrold Fitchett at [phone] in afib clinic.

## 2015-08-17 ENCOUNTER — Telehealth (HOSPITAL_COMMUNITY): Payer: Self-pay | Admitting: *Deleted

## 2015-08-17 NOTE — Telephone Encounter (Signed)
INR Check 6/8 - 2.6 See previous note for other labs/inr checks

## 2015-08-18 ENCOUNTER — Ambulatory Visit (HOSPITAL_COMMUNITY): Payer: Medicare Other | Admitting: Anesthesiology

## 2015-08-18 ENCOUNTER — Encounter (HOSPITAL_COMMUNITY): Payer: Self-pay | Admitting: *Deleted

## 2015-08-18 ENCOUNTER — Encounter (HOSPITAL_COMMUNITY)
Admission: RE | Disposition: A | Discharge status: Home / Self Care | Payer: Self-pay | Source: Ambulatory Visit | Attending: Cardiology

## 2015-08-18 ENCOUNTER — Ambulatory Visit (HOSPITAL_COMMUNITY)
Admission: RE | Admit: 2015-08-18 | Discharge: 2015-08-18 | Disposition: A | Discharge status: Home / Self Care | Payer: Medicare Other | Source: Ambulatory Visit | Attending: Cardiology | Admitting: Cardiology

## 2015-08-18 ENCOUNTER — Ambulatory Visit (HOSPITAL_COMMUNITY)
Admission: RE | Admit: 2015-08-18 | Discharge: 2015-08-18 | Disposition: A | Discharge status: Home / Self Care | Payer: Medicare Other | Source: Ambulatory Visit | Attending: Internal Medicine | Admitting: Internal Medicine

## 2015-08-18 DIAGNOSIS — Z79899 Other long term (current) drug therapy: Secondary | ICD-10-CM | POA: Insufficient documentation

## 2015-08-18 DIAGNOSIS — Z7901 Long term (current) use of anticoagulants: Secondary | ICD-10-CM | POA: Insufficient documentation

## 2015-08-18 DIAGNOSIS — I10 Essential (primary) hypertension: Secondary | ICD-10-CM | POA: Diagnosis not present

## 2015-08-18 DIAGNOSIS — I4891 Unspecified atrial fibrillation: Secondary | ICD-10-CM | POA: Diagnosis not present

## 2015-08-18 DIAGNOSIS — Z8673 Personal history of transient ischemic attack (TIA), and cerebral infarction without residual deficits: Secondary | ICD-10-CM | POA: Diagnosis not present

## 2015-08-18 DIAGNOSIS — I481 Persistent atrial fibrillation: Secondary | ICD-10-CM | POA: Insufficient documentation

## 2015-08-18 DIAGNOSIS — E7800 Pure hypercholesterolemia, unspecified: Secondary | ICD-10-CM | POA: Insufficient documentation

## 2015-08-18 DIAGNOSIS — Z792 Long term (current) use of antibiotics: Secondary | ICD-10-CM | POA: Insufficient documentation

## 2015-08-18 DIAGNOSIS — Z7982 Long term (current) use of aspirin: Secondary | ICD-10-CM | POA: Insufficient documentation

## 2015-08-18 HISTORY — PX: CARDIOVERSION: SHX1299

## 2015-08-18 SURGERY — CARDIOVERSION
Anesthesia: Monitor Anesthesia Care

## 2015-08-18 MED ORDER — LIDOCAINE HCL (CARDIAC) 20 MG/ML IV SOLN
INTRAVENOUS | Status: DC | PRN
  Administered 2015-08-18: 60 mg via INTRATRACHEAL

## 2015-08-18 MED ORDER — PROPOFOL 10 MG/ML IV BOLUS
INTRAVENOUS | Status: DC | PRN
  Administered 2015-08-18: 50 mg via INTRAVENOUS

## 2015-08-18 MED ORDER — SODIUM CHLORIDE 0.9 % IV SOLN
INTRAVENOUS | Status: DC
  Administered 2015-08-18 (×2): via INTRAVENOUS

## 2015-08-18 NOTE — CV Procedure (Signed)
    Cardioversion Note  RANIKA HAMBLEY [id number] Sep 05, 1929  Procedure: DC Cardioversion Indications: DCCV  Procedure Details Consent: Obtained Time Out: Verified patient identification, verified procedure, site/side was marked, verified correct patient position, special equipment/implants available, Radiology Safety Procedures followed,  medications/allergies/relevent history reviewed, required imaging and test results available.  Performed  The patient has been on adequate anticoagulation.  The patient received IV Propofol 50 mg and iv Lidocain 60 mg administered by anesthesia staff for sedation.  Synchronous cardioversion was performed at 120 joules.  The cardioversion was successful.   Complications: No apparent complications Patient did tolerate procedure well.   Ena Dawley, MD, [facility] 08/18/2015, 1:32 PM

## 2015-08-18 NOTE — Discharge Instructions (Signed)
Electrical Cardioversion, Care After °Refer to this sheet in the next few weeks. These instructions provide you with information on caring for yourself after your procedure. Your health care provider may also give you more specific instructions. Your treatment has been planned according to current medical practices, but problems sometimes occur. Call your health care provider if you have any problems or questions after your procedure. °WHAT TO EXPECT AFTER THE PROCEDURE °After your procedure, it is typical to have the following sensations: °· Some redness on the skin where the shocks were delivered. If this is tender, a sunburn lotion or hydrocortisone cream may help. °· Possible return of an abnormal heart rhythm within hours or days after the procedure. °HOME CARE INSTRUCTIONS °· Take medicines only as directed by your health care provider. Be sure you understand how and when to take your medicine. °· Learn how to feel your pulse and check it often. °· Limit your activity for 48 hours after the procedure or as directed by your health care provider. °· Avoid or minimize caffeine and other stimulants as directed by your health care provider. °SEEK MEDICAL CARE IF: °· You feel like your heart is beating too fast or your pulse is not regular. °· You have any questions about your medicines. °· You have bleeding that will not stop. °SEEK IMMEDIATE MEDICAL CARE IF: °· You are dizzy or feel faint. °· It is hard to breathe or you feel short of breath. °· There is a change in discomfort in your chest. °· Your speech is slurred or you have trouble moving an arm or leg on one side of your body. °· You get a serious muscle cramp that does not go away. °· Your fingers or toes turn cold or blue. °  °This information is not intended to replace advice given to you by your health care provider. Make sure you discuss any questions you have with your health care provider. °  °Document Released: 12/16/2012 Document Revised: 03/18/2014  Document Reviewed: 12/16/2012 °Elsevier Interactive Patient Education ©2016 [employer] ° °

## 2015-08-18 NOTE — Anesthesia Preprocedure Evaluation (Signed)
Anesthesia Evaluation  Patient identified by MRN, date of birth, ID band Patient awake    Reviewed: Allergy & Precautions, H&P , NPO status , Patient's Chart, lab work & pertinent test results  History of Anesthesia Complications Negative for: history of anesthetic complications  Airway Mallampati: II  TM Distance: >3 FB Neck ROM: full    Dental no notable dental hx.    Pulmonary shortness of breath,    Pulmonary exam normal breath sounds clear to auscultation       Cardiovascular hypertension, negative cardio ROS Normal cardiovascular exam+ dysrhythmias Atrial Fibrillation  Rhythm:regular Rate:Normal     Neuro/Psych CVA    GI/Hepatic negative GI ROS, Neg liver ROS,   Endo/Other  negative endocrine ROS  Renal/GU negative Renal ROS     Musculoskeletal   Abdominal   Peds  Hematology negative hematology ROS (+)   Anesthesia Other Findings   Reproductive/Obstetrics negative OB ROS                             Anesthesia Physical Anesthesia Plan  ASA: III  Anesthesia Plan: MAC   Post-op Pain Management:    Induction: Intravenous  Airway Management Planned: Mask  Additional Equipment:   Intra-op Plan:   Post-operative Plan:   Informed Consent: I have reviewed the patients History and Physical, chart, labs and discussed the procedure including the risks, benefits and alternatives for the proposed anesthesia with the patient or authorized representative who has indicated his/her understanding and acceptance.   Dental Advisory Given  Plan Discussed with: Anesthesiologist, CRNA and Surgeon  Anesthesia Plan Comments:         Anesthesia Quick Evaluation

## 2015-08-18 NOTE — Transfer of Care (Signed)
Immediate Anesthesia Transfer of Care Note  Patient: [name] [name]  Procedure(s) Performed: Procedure(s): CARDIOVERSION (N/A)  Patient Location: Endoscopy Unit  Anesthesia Type:MAC  Level of Consciousness: oriented and sedated  Airway & Oxygen Therapy: Patient Spontanous Breathing and Patient connected to nasal cannula oxygen  Post-op Assessment: Report given to RN, Post -op Vital signs reviewed and stable and Patient moving all extremities X 4  Post vital signs: Reviewed and stable  Last Vitals:  Filed Vitals:   08/18/15 1135  BP: 181/86  Pulse: 70  Temp: 36.5 C  Resp: 22    Last Pain: There were no vitals filed for this visit.       Complications: No apparent anesthesia complications

## 2015-08-18 NOTE — Interval H&P Note (Signed)
History and Physical Interval Note:  08/18/2015 11:13 AM  [name] [name]  has presented today for surgery, with the diagnosis of AFIB FLUTTER  The various methods of treatment have been discussed with the patient and family. After consideration of risks, benefits and other options for treatment, the patient has consented to  Procedure(s): CARDIOVERSION (N/A) as a surgical intervention .  The patient's history has been reviewed, patient examined, no change in status, stable for surgery.  I have reviewed the patient's chart and labs.  Questions were answered to the patient's satisfaction.     Ena Dawley

## 2015-08-18 NOTE — Anesthesia Postprocedure Evaluation (Signed)
Anesthesia Post Note  Patient: [name] [name]  Procedure(s) Performed: Procedure(s) (LRB): CARDIOVERSION (N/A)  Patient location during evaluation: PACU Anesthesia Type: General Level of consciousness: awake and alert Pain management: pain level controlled Vital Signs Assessment: post-procedure vital signs reviewed and stable Respiratory status: spontaneous breathing, nonlabored ventilation, respiratory function stable and patient connected to nasal cannula oxygen Cardiovascular status: blood pressure returned to baseline and stable Postop Assessment: no signs of nausea or vomiting Anesthetic complications: no    Last Vitals:  Filed Vitals:   08/18/15 1340 08/18/15 1350  BP: 103/52 150/62  Pulse: 51 52  Temp:    Resp: 12 24    Last Pain: There were no vitals filed for this visit.               Tiajuana Amass

## 2015-08-18 NOTE — H&P (View-Only) (Signed)
Patient ID: LENAE BODOH, female   DOB: 1929/12/13, 82 y.o.   MRN: [id number]        Primary Care Physician: Raelene Bott, MD Referring Physician: Dr. Sabino Donovan is a 82 y.o. female with a h/o persistent afib on tikosyn. She reports that her brand tikosyn was substituted for generic afib and within a few days she went into afib and stayed in afib x 5 days. It was only after being on brand tikosyn for 2 days she returned  to NSR, and has requested to stay on brand tikosyn. She continues on warfarin.  She presented to [facility] ER with c/o palpitations, near syncope and nausea, and was admitted 4/7 thru 4/9. Dicussed cardioversion but her INR was sub therapeutic at 1.8. She felt better the next day and wished not to stay for cardioversion but d/c home. She had increas in PVC's while in hospital and toprol was increased to 200 mg bid. Tikosyn continued. She believes she went back in rhythm sometime yesterday.She feels well today and will have her INR checked today, she continues on Lovenox shots. States that she has chronic nausea for years, no vomiting and would like to see a GI specialist.  Pt asked to be seen 4/27, due to increased afib burden over the last few weeks. She feels very fatigued. She continues on tikosyn and one option would to be to switch her over to amiodarone, but review of her echo 8/16 shows structural disease and her valve situation could be worse driving the afib. She is agreeable to look at repeat echo and then decide on plan going forward.  Returns 5/28, echo performed and valve status stable, but presents in afib and c/o of being weak in afib. Per discussion with Dr. Rayann Heman last week if Tikosyn not keeping in rhythm, then stop drug and try amiodarone load. She has been off Tikosyn since Friday am so will start 200 mg amiodarone  this pm and load at 200 mg bid. She has been made aware that addition of amiodarone will thin the blood and has been asked to have her INR  checked this Friday and EKG done and will fax results. In anticipation of needed DCCV with amiodarone load will ask for INR's to be done weekly for the next month and faxed here. It is more convenient to have done at [facility] office in [facility] than arrange transportation here.   She returns 5/22 after loading 2 weeks on amiodarone. She feels better on drug. Is in aflutter, rate controlled. INR's have been therapeutic at PCP office and will continue to have drawn weekly. Will go ahead and schedule DCCV for 6/9 pending therapeutic INR's. Since decreasing metoprolol dose with addition of  Amiodarone, her nausea has improved.  Today, she denies symptoms of palpitations, chest pain, shortness of breath, orthopnea, PND, lower extremity edema, dizziness, presyncope, syncope, or neurologic sequela. The patient is tolerating medications without difficulties and is otherwise without complaint today.   Past Medical History  Diagnosis Date  . Atrial fibrillation (HCC)     persistent, coumadin per PCP;  echo 06/06/09: EF 60%, mild LVH, Gr 1 diast dysfx, AV gradient mean 9, mod MR, mod LAE;  Myoview 11/09: normal, EF 81%  . TIA (transient ischemic attack)   . CVA (cerebral vascular accident) ([place])     2006  . HTN (hypertension)   . Fatigue   . Dyspnea   . Giant cell arteritis [facility])     sees dr.  at [facility]  . Back pain   . H/O: hysterectomy   . Subclavian artery stenosis, right     dopplers done 2/12;  carotids 2/12: 0-39% bilateral  . Hypercholesteremia     Secondary to vascular disease  . Cancer [facility])    Past Surgical History  Procedure Laterality Date  . Knee surgery  3/06    Left  . Appendectomy    . Abdominal hysterectomy    . Prolapsed uterine fibroid ligation  7/05    Current Outpatient Prescriptions  Medication Sig Dispense Refill  . ALPRAZolam (XANAX) 0.5 MG tablet Take 1 tablet by mouth 2 (two) times daily as needed. Sleep  1  . amiodarone (PACERONE) 200 MG tablet Take 1 tablet (200 mg  total) by mouth 2 (two) times daily. 60 tablet 3  . aspirin 81 MG tablet Take 81 mg by mouth daily.    Marland Kitchen atorvastatin (LIPITOR) 20 MG tablet Take 0.5 tablets by mouth every other day.   11  . azelastine (ASTELIN) 0.1 % nasal spray Place 2 sprays into both nostrils 2 (two) times daily as needed for rhinitis or allergies. Use in each nostril as directed    . diltiazem (CARDIZEM) 30 MG tablet Take 30 mg by mouth every 6 (six) hours as needed. For hart racing    . enalapril (VASOTEC) 20 MG tablet Take 20 mg by mouth 2 (two) times daily.     Marland Kitchen HYDROcodone-acetaminophen (NORCO/VICODIN) 5-325 MG per tablet Take 1 tablet by mouth every 8 (eight) hours as needed. pain    . Magnesium 400 MG CAPS Take 1 capsule by mouth daily.    . metoprolol (TOPROL-XL) 200 MG 24 hr tablet Take 0.5 tablets (100 mg total) by mouth 2 (two) times daily. Take with or immediately following a meal. 60 tablet 0  . naphazoline-pheniramine (NAPHCON-A) 0.025-0.3 % ophthalmic solution Place 1 drop into both eyes daily as needed for irritation.    . nitrofurantoin (MACRODANTIN) 100 MG capsule Take 100 mg by mouth daily.    Marland Kitchen warfarin (COUMADIN) 5 MG tablet Take 2.5-5 mg by mouth as directed. Take 2.5mg  mon wed fridays and sundays whole tablet rest of days    . zolpidem (AMBIEN) 10 MG tablet Take 5 mg by mouth at bedtime as needed for sleep.    . nitroGLYCERIN (NITROSTAT) 0.4 MG SL tablet Place 1 tablet (0.4 mg total) under the tongue every 5 (five) minutes as needed for chest pain. 25 tablet 3   No current facility-administered medications for this encounter.    Allergies  Allergen Reactions  . Ciprofloxacin Other (See Comments)    tachycardia  . Escitalopram     "lips puffed", "tight in chest", "nasuea", "dizzy"  . Ranitidine Diarrhea    Social History   Social History  . Marital Status: Widowed    Spouse Name: N/A  . Number of Children: N/A  . Years of Education: N/A   Occupational History  . Not on file.   Social  History Main Topics  . Smoking status: Never Smoker   . Smokeless tobacco: Not on file  . Alcohol Use: No  . Drug Use: No  . Sexual Activity: Not on file   Other Topics Concern  . Not on file   Social History Narrative   She is happily married. Her husband was with her today. She lives between [place] and [place].  Retired Advertising account planner.    Family History  Problem Relation Age of Onset  . Hypertension    .  Stroke Neg Hx   . Diabetes type II Neg Hx   . Hypertension Daughter     ROS- All systems are reviewed and negative except as per the HPI above  Physical Exam: Filed Vitals:   07/31/15 1335  BP: 122/60  Pulse: 87  Height: 5\' 2"  (1.575 m)  Weight: 123 lb (55.792 kg)    GEN- The patient is well appearing, alert and oriented x 3 today.   Head- normocephalic, atraumatic Eyes-  Sclera clear, conjunctiva pink Ears- hearing intact Oropharynx- clear Neck- supple, no JVP Lymph- no cervical lymphadenopathy Lungs- Clear to ausculation bilaterally, normal work of breathing Heart- Irregular rate and rhythm,  2/6 systolic murmur, no rubs or gallops, PMI not laterally displaced GI- soft, NT, ND, + BS Extremities- no clubbing, cyanosis, or edema MS- no significant deformity or atrophy Skin- no rash or lesion Psych- euthymic mood, full affect Neuro- strength and sensation are intact  EKG- aflutter with variable AV block, v rate at 87 bpm, qrs int 94 ms, qtc 469 ms Epic records reiewed Echo--  Left ventricle: The cavity size was normal. There was mild focal  basal hypertrophy of the septum. Systolic function was normal.  The estimated ejection fraction was in the range of 55% to 60%.  Wall motion was normal; there were no regional wall motion  abnormalities. The study is not technically sufficient to allow  evaluation of LV diastolic function. - Aortic valve: Valve mobility was restricted. There was very mild  stenosis. There was mild regurgitation. Peak velocity  (S): 241  cm/s. Mean gradient (S): 13 mm Hg. - Mitral valve: Moderately calcified annulus. There was mild to  moderate regurgitation directed posteriorly. - Left atrium: The atrium was mildly dilated.  Impressions:  - Compared to the prior study, there has been no significant  interval change.    Assessment and Plan: 1. Recent Increased afib burden Tikosyn has been stopped since 5/4 8am and  Amiodarone started  200 mg 5/8   Decrease metoprolol to 100 mg bid with addition of amiodarone Structural heart disease stable Check INR weekly with PCP and have faxed here, will need weekly therapeutic  INR's, with plans to cardiovert in 6/9 Will pan to recheck thyroid, liver and pulmonary studies in the near future Baseline thyroid/liver have previoulsy been done  2. HTN  Stable  F/u one week after cardioversion    Butch Penny C. Carroll, [facility] [address] [place], [place] [address] [phone]

## 2015-08-19 ENCOUNTER — Encounter (HOSPITAL_COMMUNITY): Payer: Self-pay | Admitting: Cardiology

## 2015-08-19 ENCOUNTER — Ambulatory Visit (HOSPITAL_COMMUNITY): Payer: Medicare Other

## 2015-08-28 ENCOUNTER — Encounter: Payer: Self-pay | Admitting: Nurse Practitioner

## 2015-08-28 ENCOUNTER — Encounter (HOSPITAL_COMMUNITY): Payer: Self-pay | Admitting: Nurse Practitioner

## 2015-08-28 ENCOUNTER — Ambulatory Visit (HOSPITAL_COMMUNITY)
Admission: RE | Admit: 2015-08-28 | Discharge: 2015-08-28 | Disposition: A | Discharge status: Home / Self Care | Payer: Medicare Other | Source: Ambulatory Visit | Attending: Nurse Practitioner | Admitting: Nurse Practitioner

## 2015-08-28 VITALS — BP 160/90 | HR 57 | Ht 62.0 in | Wt 121.0 lb

## 2015-08-28 DIAGNOSIS — R0600 Dyspnea, unspecified: Secondary | ICD-10-CM | POA: Insufficient documentation

## 2015-08-28 DIAGNOSIS — E7800 Pure hypercholesterolemia, unspecified: Secondary | ICD-10-CM | POA: Diagnosis not present

## 2015-08-28 DIAGNOSIS — I10 Essential (primary) hypertension: Secondary | ICD-10-CM | POA: Diagnosis not present

## 2015-08-28 DIAGNOSIS — Z8673 Personal history of transient ischemic attack (TIA), and cerebral infarction without residual deficits: Secondary | ICD-10-CM | POA: Insufficient documentation

## 2015-08-28 DIAGNOSIS — R001 Bradycardia, unspecified: Secondary | ICD-10-CM | POA: Insufficient documentation

## 2015-08-28 DIAGNOSIS — R42 Dizziness and giddiness: Secondary | ICD-10-CM

## 2015-08-28 DIAGNOSIS — R9431 Abnormal electrocardiogram [ECG] [EKG]: Secondary | ICD-10-CM | POA: Insufficient documentation

## 2015-08-28 DIAGNOSIS — R5383 Other fatigue: Secondary | ICD-10-CM | POA: Insufficient documentation

## 2015-08-28 DIAGNOSIS — I481 Persistent atrial fibrillation: Secondary | ICD-10-CM | POA: Diagnosis not present

## 2015-08-28 MED ORDER — AMIODARONE HCL 200 MG PO TABS: 200.0000 mg | ORAL_TABLET | Freq: Every day | ORAL | Status: DC

## 2015-08-28 MED ORDER — METOPROLOL SUCCINATE ER 100 MG PO TB24: 100.0000 mg | ORAL_TABLET | Freq: Two times a day (BID) | ORAL | Status: DC

## 2015-08-28 NOTE — Progress Notes (Signed)
Patient ID: [name] [name], female   DOB: 10-15-1929, 82 y.o.   MRN: [id number]     Primary Care Physician: Raelene Bott, MD Referring Physician: Dr. Sabino Donovan is a 82 y.o. female with a h/o persistent afib that recently failed tikosyn and was loaded on amiodarone and had successful cardioversion 6/9. She has had some dizziness since being cardioverted and HR's at home range at times in the upper 30's, but mostly in the upper 40's and is 50's. She is still on amiodarone at 200 mg bid and this may be contributing to brady, now back in SR. We discussed today to decrease dose of amiodarone to one tab a day and if HR stayed on slow side, then will reduce metoprolol to 50 mg bid. Her BP initially elevated but rechecked at 160/90. BP readings at home reviewed and have been controlled, most notable slow HR.   Today, she denies symptoms of palpitations, chest pain, shortness of breath, orthopnea, PND, lower extremity edema, dizziness, presyncope, syncope, or neurologic sequela. The patient is tolerating medications without difficulties and is otherwise without complaint today.   Past Medical History  Diagnosis Date  . Atrial fibrillation (HCC)     persistent, coumadin per PCP;  echo 06/06/09: EF 60%, mild LVH, Gr 1 diast dysfx, AV gradient mean 9, mod MR, mod LAE;  Myoview 11/09: normal, EF 81%  . TIA (transient ischemic attack)   . CVA (cerebral vascular accident) ([place])     2006  . HTN (hypertension)   . Fatigue   . Dyspnea   . Giant cell arteritis [facility])     sees dr. at [facility]  . Back pain   . H/O: hysterectomy   . Subclavian artery stenosis, right     dopplers done 2/12;  carotids 2/12: 0-39% bilateral  . Hypercholesteremia     Secondary to vascular disease  . Cancer [facility])    Past Surgical History  Procedure Laterality Date  . Knee surgery  3/06    Left  . Appendectomy    . Abdominal hysterectomy    . Prolapsed uterine fibroid ligation  7/05  . Cardioversion N/A 08/18/2015      Procedure: CARDIOVERSION;  Surgeon: Dorothy Spark, MD;  Location: [facility] ENDOSCOPY;  Service: Cardiovascular;  Laterality: N/A;    Current Outpatient Prescriptions  Medication Sig Dispense Refill  . ALPRAZolam (XANAX) 0.5 MG tablet Take 1 tablet by mouth 2 (two) times daily as needed. Sleep  1  . amiodarone (PACERONE) 200 MG tablet Take 1 tablet (200 mg total) by mouth daily. 60 tablet 3  . aspirin 81 MG tablet Take 81 mg by mouth daily.    Marland Kitchen atorvastatin (LIPITOR) 20 MG tablet Take 0.5 tablets by mouth every other day.   11  . enalapril (VASOTEC) 20 MG tablet Take 20 mg by mouth 2 (two) times daily.     Marland Kitchen HYDROcodone-acetaminophen (NORCO/VICODIN) 5-325 MG per tablet Take 1 tablet by mouth every 8 (eight) hours as needed. pain    . metoprolol (TOPROL-XL) 100 MG 24 hr tablet Take 1 tablet (100 mg total) by mouth 2 (two) times daily. Take with or immediately following a meal. 60 tablet 3  . naphazoline-pheniramine (NAPHCON-A) 0.025-0.3 % ophthalmic solution Place 1 drop into both eyes daily as needed for irritation.    Marland Kitchen warfarin (COUMADIN) 5 MG tablet Take 2.5-5 mg by mouth as directed. Take 2.5mg  mon wed fridays and sundays whole tablet rest of days    .  zolpidem (AMBIEN) 10 MG tablet Take 5 mg by mouth at bedtime as needed for sleep.    Marland Kitchen diltiazem (CARDIZEM) 30 MG tablet Take 30 mg by mouth every 6 (six) hours as needed. Reported on 08/28/2015    . nitroGLYCERIN (NITROSTAT) 0.4 MG SL tablet Place 1 tablet (0.4 mg total) under the tongue every 5 (five) minutes as needed for chest pain. 25 tablet 3   No current facility-administered medications for this encounter.    Allergies  Allergen Reactions  . Ciprofloxacin Other (See Comments)    tachycardia  . Escitalopram     "lips puffed", "tight in chest", "nasuea", "dizzy"  . Ranitidine Diarrhea    Social History   Social History  . Marital Status: Widowed    Spouse Name: N/A  . Number of Children: N/A  . Years of Education: N/A    Occupational History  . Not on file.   Social History Main Topics  . Smoking status: Never Smoker   . Smokeless tobacco: Not on file  . Alcohol Use: No  . Drug Use: No  . Sexual Activity: Not on file   Other Topics Concern  . Not on file   Social History Narrative   She is happily married. Her husband was with her today. She lives between [place] and [place].  Retired Advertising account planner.    Family History  Problem Relation Age of Onset  . Hypertension    . Stroke Neg Hx   . Diabetes type II Neg Hx   . Hypertension Daughter     ROS- All systems are reviewed and negative except as per the HPI above  Physical Exam: Filed Vitals:   08/28/15 1326 08/28/15 1400  BP: 182/106 160/90  Pulse: 57   Height: 5\' 2"  (1.575 m)   Weight: 121 lb (54.885 kg)     GEN- The patient is well appearing, alert and oriented x 3 today.   Head- normocephalic, atraumatic Eyes-  Sclera clear, conjunctiva pink Ears- hearing intact Oropharynx- clear Neck- supple, no JVP Lymph- no cervical lymphadenopathy Lungs- Clear to ausculation bilaterally, normal work of breathing Heart- Slow regular rate and rhythm, no murmurs, rubs or gallops, PMI not laterally displaced GI- soft, NT, ND, + BS Extremities- no clubbing, cyanosis, or edema MS- no significant deformity or atrophy Skin- no rash or lesion Psych- euthymic mood, full affect Neuro- strength and sensation are intact  EKG- Sinus brady at 57 bpm, pr int 174 ms, qrs int 92 ms, qtc 457 ms Epic records reviewed  Assessment and Plan: 1. Afib Failed tikosyn Returned to SR with amiodarone 200 mg bid and successful cardioversion Now with dizziness Reduce amiodarone to 200 mg qd Will call the office Friday with HR recordings and to report if dizziness improved. If still with slower HR/dizziness at that time, reduce metoprolol to 50 mg bid( will call in 100 mg tab to be cut in half, currently halving 200 mg tab)  INR checks with Primary MD, please  let them know that amiodarone dose has been reduced May need additional BP med if metoprolol reduced F/u with PCP as planned 6/21  Afib clinic in 2 weeks  Butch Penny C. Deshane Cotroneo, [facility] [address] [place], [place] [address] [phone]

## 2015-08-28 NOTE — Patient Instructions (Signed)
Your physician has recommended you make the following change in your medication:  1)Decrease Amiodarone to 200mg  once a day 2)Metoprolol (toprol) 100mg  twice a day ==== call [name] [name] on Friday with HR/BP readings.

## 2015-09-01 ENCOUNTER — Telehealth (HOSPITAL_COMMUNITY): Payer: Self-pay | Admitting: *Deleted

## 2015-09-01 NOTE — Telephone Encounter (Signed)
Patient called in to report BP/HR log since reducing dose of Amiodarone to 200mg  once a day. Patient states her PCP did stop her enalapril this week as well but instructed her to restart if her BP readings started to become elevated.  6/20 - 120/76 HR 51 6/21- 145/88 HR 69 6-22 145/72 HR 63. Instructed patient to stay on metoprolol 100mg  twice a day since BP/HR readings are acceptable and HR is stable.  She was intermittently confused with medications she stated during the week accidentally took 200mg  of metoprolol one night and was extremely dizziness due to this and yesterday she took amiodarone twice.  She should be on Amiodarone 200mg  once a day and Metoprolol 100mg  twice a day. Encouraged patient to use a pill box to help organize her medication and use a medication list to assist with dosing.  Patient will call back if further questions or concerns.

## 2015-09-04 ENCOUNTER — Telehealth (HOSPITAL_COMMUNITY): Payer: Self-pay | Admitting: *Deleted

## 2015-09-04 MED ORDER — METOPROLOL SUCCINATE ER 100 MG PO TB24: ORAL_TABLET | ORAL | Status: DC

## 2015-09-04 NOTE — Telephone Encounter (Signed)
Patient's sitter called in this afternoon stating patient is still having issues with dizziness "feels crazy when shes walking". BP 144/70 HR 60-70s. Reported patient decreased metoprolol to 50mg  twice a day since Friday (was not instructed to do this but did by mistake) confirms she is only taking amiodarone once a day.  Discussed with Roderic Palau, NP who recommended she be evaluated by her PCP regarding dizziness as this maybe related to inner ear or vertigo as BP/HR are both stable.  Pt is in agreement of this plan and is making appt with PCP this week.

## 2015-09-13 ENCOUNTER — Encounter (HOSPITAL_COMMUNITY): Payer: Self-pay | Admitting: Nurse Practitioner

## 2015-09-13 ENCOUNTER — Ambulatory Visit (HOSPITAL_COMMUNITY)
Admission: RE | Admit: 2015-09-13 | Discharge: 2015-09-13 | Disposition: A | Discharge status: Home / Self Care | Payer: Medicare Other | Source: Ambulatory Visit | Attending: Nurse Practitioner | Admitting: Nurse Practitioner

## 2015-09-13 VITALS — BP 162/96 | HR 67 | Ht 62.0 in | Wt 121.6 lb

## 2015-09-13 DIAGNOSIS — I10 Essential (primary) hypertension: Secondary | ICD-10-CM | POA: Diagnosis not present

## 2015-09-13 DIAGNOSIS — Z8673 Personal history of transient ischemic attack (TIA), and cerebral infarction without residual deficits: Secondary | ICD-10-CM | POA: Diagnosis not present

## 2015-09-13 DIAGNOSIS — Z79899 Other long term (current) drug therapy: Secondary | ICD-10-CM | POA: Insufficient documentation

## 2015-09-13 DIAGNOSIS — I481 Persistent atrial fibrillation: Secondary | ICD-10-CM | POA: Insufficient documentation

## 2015-09-13 DIAGNOSIS — Z7982 Long term (current) use of aspirin: Secondary | ICD-10-CM | POA: Diagnosis not present

## 2015-09-13 DIAGNOSIS — I4891 Unspecified atrial fibrillation: Secondary | ICD-10-CM | POA: Diagnosis present

## 2015-09-13 DIAGNOSIS — Z7901 Long term (current) use of anticoagulants: Secondary | ICD-10-CM | POA: Insufficient documentation

## 2015-09-13 DIAGNOSIS — I480 Paroxysmal atrial fibrillation: Secondary | ICD-10-CM | POA: Insufficient documentation

## 2015-09-13 DIAGNOSIS — R42 Dizziness and giddiness: Secondary | ICD-10-CM

## 2015-09-13 DIAGNOSIS — E7800 Pure hypercholesterolemia, unspecified: Secondary | ICD-10-CM | POA: Diagnosis not present

## 2015-09-13 LAB — TSH: TSH: 0.657 u[IU]/mL (ref 0.350–4.500)

## 2015-09-13 MED ORDER — AMIODARONE HCL 200 MG PO TABS: 100.0000 mg | ORAL_TABLET | Freq: Every day | ORAL | Status: DC

## 2015-09-13 NOTE — Progress Notes (Signed)
Patient ID: [name] [name], female   DOB: 1929/09/05, 82 y.o.   MRN: [id number]        Primary Care Physician: Raelene Bott, MD Referring Physician: Dr. Sabino Donovan is a 82 y.o. female with a h/o persistent afib on tikosyn. She reports that her brand tikosyn was substituted for generic afib and within a few days she went into afib and stayed in afib x 5 days. It was only after being on brand tikosyn for 2 days she returned  to NSR, and has requested to stay on brand tikosyn. She continues on warfarin.  She presented to [facility] ER with c/o palpitations, near syncope and nausea, and was admitted 4/7 thru 4/9. Dicussed cardioversion but her INR was sub therapeutic at 1.8. She felt better the next day and wished not to stay for cardioversion but d/c home. She had increas in PVC's while in hospital and toprol was increased to 200 mg bid. Tikosyn continued. She believes she went back in rhythm sometime yesterday.She feels well today and will have her INR checked today, she continues on Lovenox shots. States that she has chronic nausea for years, no vomiting and would like to see a GI specialist.  Pt asked to be seen 4/27, due to increased afib burden over the last few weeks. She feels very fatigued. She continues on tikosyn and one option would to be to switch her over to amiodarone, but review of her echo 8/16 shows structural disease and her valve situation could be worse driving the afib. She is agreeable to look at repeat echo and then decide on plan going forward.  Returns 5/28, echo performed and valve status stable, but presents in afib and c/o of being weak in afib. Per discussion with Dr. Rayann Heman last week if Tikosyn not keeping in rhythm, then stop drug and try amiodarone load. She has been off Tikosyn since Friday am so will start 200 mg amiodarone  this pm and load at 200 mg bid. She has been made aware that addition of amiodarone will thin the blood and has been asked to have her INR  checked this Friday and EKG done and will fax results. In anticipation of needed DCCV with amiodarone load will ask for INR's to be done weekly for the next month and faxed here. It is more convenient to have done at [facility] office in [facility] than arrange transportation here.   She returns 5/22 after loading 2 weeks on amiodarone. She feels better on drug. Is in aflutter, rate controlled. INR's have been therapeutic at PCP office and will continue to have drawn weekly. Will go ahead and schedule DCCV for 6/9 pending therapeutic INR's. Since decreasing metoprolol dose with addition of  Amiodarone, her nausea has improved.  Returns 7/5 and is in [place] but has been feeling dizzy since last visit here. Last visit metoprolol was decreased due to low heart rates which may have been responsible for symptoms, but since taking less metoprolol , pt 's HR readings are now in the 60's, but she is not noticing less dizziness. She notices more with moving her head or walking. Her PCP last reduced her ACE inhibitor but has not helped dizziness and BP went up so she resumed prior dose. She sees her PCP again this afternoon and the pt states he may refer her to neuro or do a head CT. She has stayed in [place] as far as she knows. She continues on warfarin.  Today, she denies symptoms  of palpitations, chest pain, shortness of breath, orthopnea, PND, lower extremity edema,  presyncope, syncope, or neurologic sequela. Positive for dizziness. The patient is tolerating medications without difficulties and is otherwise without complaint today.   Past Medical History  Diagnosis Date  . Atrial fibrillation (HCC)     persistent, coumadin per PCP;  echo 06/06/09: EF 60%, mild LVH, Gr 1 diast dysfx, AV gradient mean 9, mod MR, mod LAE;  Myoview 11/09: normal, EF 81%  . TIA (transient ischemic attack)   . CVA (cerebral vascular accident) ([place])     2006  . HTN (hypertension)   . Fatigue   . Dyspnea   . Giant cell arteritis [facility])      sees dr. at [facility]  . Back pain   . H/O: hysterectomy   . Subclavian artery stenosis, right     dopplers done 2/12;  carotids 2/12: 0-39% bilateral  . Hypercholesteremia     Secondary to vascular disease  . Cancer [facility])    Past Surgical History  Procedure Laterality Date  . Knee surgery  3/06    Left  . Appendectomy    . Abdominal hysterectomy    . Prolapsed uterine fibroid ligation  7/05  . Cardioversion N/A 08/18/2015    Procedure: CARDIOVERSION;  Surgeon: Dorothy Spark, MD;  Location: [facility] ENDOSCOPY;  Service: Cardiovascular;  Laterality: N/A;    Current Outpatient Prescriptions  Medication Sig Dispense Refill  . ALPRAZolam (XANAX) 0.5 MG tablet Take 1 tablet by mouth 2 (two) times daily as needed. Sleep  1  . amiodarone (PACERONE) 200 MG tablet Take 0.5 tablets (100 mg total) by mouth daily. 60 tablet 3  . aspirin 81 MG tablet Take 81 mg by mouth daily.    Marland Kitchen atorvastatin (LIPITOR) 20 MG tablet Take 0.5 tablets by mouth every other day.   11  . enalapril (VASOTEC) 20 MG tablet Take 20 mg by mouth 2 (two) times daily.     Marland Kitchen HYDROcodone-acetaminophen (NORCO/VICODIN) 5-325 MG per tablet Take 1 tablet by mouth every 8 (eight) hours as needed. pain    . metoprolol succinate (TOPROL-XL) 100 MG 24 hr tablet Take 1/2 tablet (50mg ) by mouth twice a day 60 tablet 3  . naphazoline-pheniramine (NAPHCON-A) 0.025-0.3 % ophthalmic solution Place 1 drop into both eyes daily as needed for irritation.    Marland Kitchen warfarin (COUMADIN) 5 MG tablet Take 2.5-5 mg by mouth as directed. Take 2.5mg  mon wed fridays and sundays whole tablet rest of days    . zolpidem (AMBIEN) 10 MG tablet Take 5 mg by mouth at bedtime as needed for sleep.    Marland Kitchen diltiazem (CARDIZEM) 30 MG tablet Take 30 mg by mouth every 6 (six) hours as needed. Reported on 09/13/2015    . nitroGLYCERIN (NITROSTAT) 0.4 MG SL tablet Place 1 tablet (0.4 mg total) under the tongue every 5 (five) minutes as needed for chest pain. 25 tablet 3   No current  facility-administered medications for this encounter.    Allergies  Allergen Reactions  . Ciprofloxacin Other (See Comments)    tachycardia  . Escitalopram     "lips puffed", "tight in chest", "nasuea", "dizzy"  . Ranitidine Diarrhea    Social History   Social History  . Marital Status: Widowed    Spouse Name: N/A  . Number of Children: N/A  . Years of Education: N/A   Occupational History  . Not on file.   Social History Main Topics  . Smoking status: Never Smoker   .  Smokeless tobacco: Not on file  . Alcohol Use: No  . Drug Use: No  . Sexual Activity: Not on file   Other Topics Concern  . Not on file   Social History Narrative   She is happily married. Her husband was with her today. She lives between [place] and [place].  Retired Advertising account planner.    Family History  Problem Relation Age of Onset  . Hypertension    . Stroke Neg Hx   . Diabetes type II Neg Hx   . Hypertension Daughter     ROS- All systems are reviewed and negative except as per the HPI above  Physical Exam: Filed Vitals:   09/13/15 1057  BP: 162/96  Pulse: 67  Height: 5\' 2"  (1.575 m)  Weight: 121 lb 9.6 oz (55.157 kg)    GEN- The patient is well appearing, alert and oriented x 3 today.   Head- normocephalic, atraumatic Eyes-  Sclera clear, conjunctiva pink Ears- hearing intact Oropharynx- clear Neck- supple, no JVP, bilateral soft bruits but may be radiation of cardiac murmur. Lymph- no cervical lymphadenopathy Lungs- Clear to ausculation bilaterally, normal work of breathing Heart- regular rate and rhythm,  2/6 systolic murmur, no rubs or gallops, PMI not laterally displaced GI- soft, NT, ND, + BS Extremities- no clubbing, cyanosis, or edema MS- no significant deformity or atrophy Skin- no rash or lesion Psych- euthymic mood, full affect Neuro- strength and sensation are intact  EKG- SR at 67 bpm, pr int 168 ms, qrs int 90 ms, qtc 469 ms Epic records reiewed Echo--  Left  ventricle: The cavity size was normal. There was mild focal  basal hypertrophy of the septum. Systolic function was normal.  The estimated ejection fraction was in the range of 55% to 60%.  Wall motion was normal; there were no regional wall motion  abnormalities. The study is not technically sufficient to allow  evaluation of LV diastolic function. - Aortic valve: Valve mobility was restricted. There was very mild  stenosis. There was mild regurgitation. Peak velocity (S): 241  cm/s. Mean gradient (S): 13 mm Hg. - Mitral valve: Moderately calcified annulus. There was mild to  moderate regurgitation directed posteriorly. - Left atrium: The atrium was mildly dilated.  Impressions:  - Compared to the prior study, there has been no significant  interval change.    Assessment and Plan: 1. PAF Failed tikosyn, now on amiodarone s/p successful cardioversion  C/o dizziness There is a chance that amiodarone may be causing dizziness and will get an amiodarone level today and decrease to 100 mg a day TSH today   2. HTN   Per PCP  Elevated today, meds being changed for dizziness c/o's  F/u with PCP as planned this pm for further w/u of dizziness  F/u 3 weeks    Butch Penny C. Paiten Boies, [facility] [address] [place], [place] [address] [phone]

## 2015-09-13 NOTE — Patient Instructions (Signed)
Your physician has recommended you make the following change in your medication:  1)Decrease Amiodarone to 100mg  once a day (half of the 200mg  tablet)

## 2015-09-14 LAB — AMIODARONE LEVEL
AMIODARONE LVL: 2.5 ug/mL (ref 1.0–2.5)
N-Desethyl-Amiodarone: 1.1 ug/mL (ref 1.0–2.5)

## 2015-09-18 ENCOUNTER — Ambulatory Visit (HOSPITAL_COMMUNITY): Payer: Medicare Other | Admitting: Nurse Practitioner

## 2015-10-03 ENCOUNTER — Ambulatory Visit (HOSPITAL_COMMUNITY)
Admission: RE | Admit: 2015-10-03 | Discharge: 2015-10-03 | Disposition: A | Discharge status: Home / Self Care | Payer: Medicare Other | Source: Ambulatory Visit | Attending: Nurse Practitioner | Admitting: Nurse Practitioner

## 2015-10-03 ENCOUNTER — Encounter (HOSPITAL_COMMUNITY): Payer: Self-pay | Admitting: Nurse Practitioner

## 2015-10-03 VITALS — BP 144/76 | HR 69 | Ht 62.0 in | Wt 122.0 lb

## 2015-10-03 DIAGNOSIS — Z7901 Long term (current) use of anticoagulants: Secondary | ICD-10-CM | POA: Diagnosis not present

## 2015-10-03 DIAGNOSIS — M316 Other giant cell arteritis: Secondary | ICD-10-CM | POA: Insufficient documentation

## 2015-10-03 DIAGNOSIS — I480 Paroxysmal atrial fibrillation: Secondary | ICD-10-CM | POA: Diagnosis present

## 2015-10-03 DIAGNOSIS — M549 Dorsalgia, unspecified: Secondary | ICD-10-CM | POA: Insufficient documentation

## 2015-10-03 DIAGNOSIS — Z7982 Long term (current) use of aspirin: Secondary | ICD-10-CM | POA: Diagnosis not present

## 2015-10-03 DIAGNOSIS — R0600 Dyspnea, unspecified: Secondary | ICD-10-CM | POA: Insufficient documentation

## 2015-10-03 DIAGNOSIS — I10 Essential (primary) hypertension: Secondary | ICD-10-CM | POA: Diagnosis not present

## 2015-10-03 DIAGNOSIS — Z8673 Personal history of transient ischemic attack (TIA), and cerebral infarction without residual deficits: Secondary | ICD-10-CM | POA: Diagnosis not present

## 2015-10-03 DIAGNOSIS — E7800 Pure hypercholesterolemia, unspecified: Secondary | ICD-10-CM | POA: Insufficient documentation

## 2015-10-03 NOTE — Progress Notes (Signed)
Patient ID: [name] [name], female   DOB: 02/17/1930, 82 y.o.   MRN: [id number]        Primary Care Physician: Raelene Bott, MD Referring Physician: Dr. Sabino Donovan is a 82 y.o. female with a h/o persistent afib on tikosyn. She reports that her brand tikosyn was substituted for generic afib and within a few days she went into afib and stayed in afib x 5 days. It was only after being on brand tikosyn for 2 days she returned  to NSR, and has requested to stay on brand tikosyn. She continues on warfarin.  She presented to [facility] ER with c/o palpitations, near syncope and nausea, and was admitted 4/7 thru 4/9. Dicussed cardioversion but her INR was sub therapeutic at 1.8. She felt better the next day and wished not to stay for cardioversion but d/c home. She had increas in PVC's while in hospital and toprol was increased to 200 mg bid. Tikosyn continued. She believes she went back in rhythm sometime yesterday.She feels well today and will have her INR checked today, she continues on Lovenox shots. States that she has chronic nausea for years, no vomiting and would like to see a GI specialist.  Pt asked to be seen 4/27, due to increased afib burden over the last few weeks. She feels very fatigued. She continues on tikosyn and one option would to be to switch her over to amiodarone, but review of her echo 8/16 shows structural disease and her valve situation could be worse driving the afib. She is agreeable to look at repeat echo and then decide on plan going forward.  Returns 5/28, echo performed and valve status stable, but presents in afib and c/o of being weak in afib. Per discussion with Dr. Rayann Heman last week if Tikosyn not keeping in rhythm, then stop drug and try amiodarone load. She has been off Tikosyn since Friday am so will start 200 mg amiodarone  this pm and load at 200 mg bid. She has been made aware that addition of amiodarone will thin the blood and has been asked to have her INR  checked this Friday and EKG done and will fax results. In anticipation of needed DCCV with amiodarone load will ask for INR's to be done weekly for the next month and faxed here. It is more convenient to have done at [facility] office in [facility] than arrange transportation here.   She returns 5/22 after loading 2 weeks on amiodarone. She feels better on drug. Is in aflutter, rate controlled. INR's have been therapeutic at PCP office and will continue to have drawn weekly. Will go ahead and schedule DCCV for 6/9 pending therapeutic INR's. Since decreasing metoprolol dose with addition of  Amiodarone, her nausea has improved.  Returns 7/5 and is in [place] but has been feeling dizzy since last visit here. Last visit metoprolol was decreased due to low heart rates which may have been responsible for symptoms, but since taking less metoprolol , pt 's HR readings are now in the 60's, but she is not noticing less dizziness. She notices more with moving her head or walking. Her PCP last reduced her ACE inhibitor but has not helped dizziness and BP went up so she resumed prior dose. She sees her PCP again this afternoon and the pt states he may refer her to neuro or do a head CT. She has stayed in [place] as far as she knows. She continues on warfarin.  Returns to afib clinic  today and dizziness, that she has had for many weeks, has resolved for the last 3-5 days. BP is much better controlled today and she reports that her PCP did have a head CT and carotid U/S and pt reports normal.EKG shows SR and has noticed any irregular heart beat. She is c/o of some occasional chest tightness that will resolve when she goes to sleep.  Today, she denies symptoms of palpitations, chest pain, shortness of breath, orthopnea, PND, lower extremity edema,  presyncope, syncope, or neurologic sequela. Positive for dizziness. The patient is tolerating medications without difficulties and is otherwise without complaint today.   Past Medical  History:  Diagnosis Date  . Atrial fibrillation (HCC)    persistent, coumadin per PCP;  echo 06/06/09: EF 60%, mild LVH, Gr 1 diast dysfx, AV gradient mean 9, mod MR, mod LAE;  Myoview 11/09: normal, EF 81%  . Back pain   . Cancer ([place])   . CVA (cerebral vascular accident) ([place])    2006  . Dyspnea   . Fatigue   . Giant cell arteritis [facility])    se[facility] dr. at [facility]  . H/O: hysterectomy   . HTN (hypertension)   . Hypercholesteremia    Secondary to vascular disease  . Subclavian artery stenosis, right    dopplers done 2/12;  carotids 2/12: 0-39% bilateral  . TIA (transient ischemic attack)    Past Surgical History:  Procedure Laterality Date  . ABDOMINAL HYSTERECTOMY    . APPENDECTOMY    . CARDIOVERSION N/A 08/18/2015   Procedure: CARDIOVERSION;  Surgeon: Dorothy Spark, MD;  Location: [place];  Service: Cardiovascular;  Laterality: N/A;  . KNEE SURGERY  3/06   Left  . PROLAPSED UTERINE FIBROID LIGATION  7/05    Current Outpatient Prescriptions  Medication Sig Dispense Refill  . ALPRAZolam (XANAX) 0.5 MG tablet Take 1 tablet by mouth 2 (two) times daily as needed. Sleep  1  . amiodarone (PACERONE) 200 MG tablet Take 0.5 tablets (100 mg total) by mouth daily. 60 tablet 3  . amLODipine (NORVASC) 5 MG tablet Take 5 mg by mouth daily.    Marland Kitchen aspirin 81 MG tablet Take 81 mg by mouth daily.    Marland Kitchen atorvastatin (LIPITOR) 20 MG tablet Take 0.5 tablets by mouth every other day.   11  . diltiazem (CARDIZEM) 30 MG tablet Take 30 mg by mouth every 6 (six) hours as needed. Reported on 09/13/2015    . HYDROcodone-acetaminophen (NORCO/VICODIN) 5-325 MG per tablet Take 1 tablet by mouth every 8 (eight) hours as needed. pain    . metoprolol succinate (TOPROL-XL) 100 MG 24 hr tablet Take 1/2 tablet (50mg ) by mouth twice a day 60 tablet 3  . mirtazapine (REMERON) 15 MG tablet Take 7.5 mg by mouth at bedtime.    . naphazoline-pheniramine (NAPHCON-A) 0.025-0.3 % ophthalmic solution Place 1 drop into both  eyes daily as needed for irritation.    Marland Kitchen warfarin (COUMADIN) 5 MG tablet Take 2.5-5 mg by mouth as directed. Take 2.5mg  mon wed fridays and sundays whole tablet rest of days    . zolpidem (AMBIEN) 10 MG tablet Take 5 mg by mouth at bedtime as needed for sleep.    . nitroGLYCERIN (NITROSTAT) 0.4 MG SL tablet Place 1 tablet (0.4 mg total) under the tongue every 5 (five) minutes as needed for chest pain. 25 tablet 3   No current facility-administered medications for this encounter.     Allergies  Allergen Reactions  . Ciprofloxacin Other (  See Comments)    tachycardia  . Escitalopram     "lips puffed", "tight in chest", "nasuea", "dizzy"  . Ranitidine Diarrhea    Social History   Social History  . Marital status: Widowed    Spouse name: N/A  . Number of children: N/A  . Years of education: N/A   Occupational History  . Not on file.   Social History Main Topics  . Smoking status: Never Smoker  . Smokeless tobacco: Not on file  . Alcohol use No  . Drug use: No  . Sexual activity: Not on file   Other Topics Concern  . Not on file   Social History Narrative   She is happily married. Her husband was with her today. She lives between [place] and [place].  Retired Advertising account planner.    Family History  Problem Relation Age of Onset  . Hypertension    . Stroke Neg Hx   . Diabetes type II Neg Hx   . Hypertension Daughter     ROS- All systems are reviewed and negative except as per the HPI above  Physical Exam: Vitals:   10/03/15 1335  BP: (!) 144/76  Pulse: 69  Weight: 122 lb (55.3 kg)  Height: 5\' 2"  (1.575 m)    GEN- The patient is well appearing, alert and oriented x 3 today.   Head- normocephalic, atraumatic Eyes-  Sclera clear, conjunctiva pink Ears- hearing intact Oropharynx- clear Neck- supple, no JVP, bilateral soft bruits but may be radiation of cardiac murmur. Lymph- no cervical lymphadenopathy Lungs- Clear to ausculation bilaterally, normal work of  breathing Heart- regular rate and rhythm,  2/6 systolic murmur, no rubs or gallops, PMI not laterally displaced GI- soft, NT, ND, + BS Extremities- no clubbing, cyanosis, or edema MS- no significant deformity or atrophy Skin- no rash or lesion Psych- euthymic mood, full affect Neuro- strength and sensation are intact  EKG- SR at 67 bpm, pr int 168 ms, qrs int 90 ms, qtc 469 ms Epic records reiewed Echo--  Left ventricle: The cavity size was normal. There was mild focal  basal hypertrophy of the septum. Systolic function was normal.  The estimated ejection fraction was in the range of 55% to 60%.  Wall motion was normal; there were no regional wall motion  abnormalities. The study is not technically sufficient to allow  evaluation of LV diastolic function. - Aortic valve: Valve mobility was restricted. There was very mild  stenosis. There was mild regurgitation. Peak velocity (S): 241  cm/s. Mean gradient (S): 13 mm Hg. - Mitral valve: Moderately calcified annulus. There was mild to  moderate regurgitation directed posteriorly. - Left atrium: The atrium was mildly dilated.  Impressions:  - Compared to the prior study, there has been no significant  interval change.    Assessment and Plan: 1. PAF Failed tikosyn, now on amiodarone s/p successful cardioversion  Now on amiodarone 100 mg a day and dizziness has resovled, maintaining SR TSH/cmet drawn in early July, normal results  2. HTN  Much better controlled Per PCP  3. Have f/u with pcp next week I asked her to mention intermittent chest tightness at night May need a stress test and will be more convenient for pt to have in [facility] area since she does not drive  F/u afib clinic 3 months   Butch Penny C. Kailin Principato, [facility] [address] [place], [place] [address] [phone]

## 2016-01-02 ENCOUNTER — Ambulatory Visit (HOSPITAL_COMMUNITY)
Admission: RE | Admit: 2016-01-02 | Discharge: 2016-01-02 | Disposition: A | Discharge status: Home / Self Care | Payer: Medicare Other | Source: Ambulatory Visit | Attending: Nurse Practitioner | Admitting: Nurse Practitioner

## 2016-01-02 VITALS — BP 140/88 | HR 67 | Ht 62.0 in | Wt 126.4 lb

## 2016-01-02 DIAGNOSIS — Z8673 Personal history of transient ischemic attack (TIA), and cerebral infarction without residual deficits: Secondary | ICD-10-CM | POA: Insufficient documentation

## 2016-01-02 DIAGNOSIS — R42 Dizziness and giddiness: Secondary | ICD-10-CM | POA: Diagnosis not present

## 2016-01-02 DIAGNOSIS — Z79899 Other long term (current) drug therapy: Secondary | ICD-10-CM | POA: Insufficient documentation

## 2016-01-02 DIAGNOSIS — Z7901 Long term (current) use of anticoagulants: Secondary | ICD-10-CM | POA: Insufficient documentation

## 2016-01-02 DIAGNOSIS — I481 Persistent atrial fibrillation: Secondary | ICD-10-CM | POA: Diagnosis present

## 2016-01-02 DIAGNOSIS — Z7982 Long term (current) use of aspirin: Secondary | ICD-10-CM | POA: Diagnosis not present

## 2016-01-02 DIAGNOSIS — I4819 Other persistent atrial fibrillation: Secondary | ICD-10-CM

## 2016-01-02 DIAGNOSIS — I10 Essential (primary) hypertension: Secondary | ICD-10-CM | POA: Diagnosis not present

## 2016-01-02 LAB — COMPREHENSIVE METABOLIC PANEL
ALT: 26 U/L (ref 14–54)
ANION GAP: 6 (ref 5–15)
AST: 39 U/L (ref 15–41)
Albumin: 3.7 g/dL (ref 3.5–5.0)
Alkaline Phosphatase: 85 U/L (ref 38–126)
BILIRUBIN TOTAL: 1.1 mg/dL (ref 0.3–1.2)
BUN: 19 mg/dL (ref 6–20)
CO2: 26 mmol/L (ref 22–32)
Calcium: 9.4 mg/dL (ref 8.9–10.3)
Chloride: 107 mmol/L (ref 101–111)
Creatinine, Ser: 1.27 mg/dL — ABNORMAL HIGH (ref 0.44–1.00)
GFR, EST AFRICAN AMERICAN: 44 mL/min — AB (ref >60)
GFR, EST NON AFRICAN AMERICAN: 38 mL/min — AB (ref >60)
Glucose, Bld: 139 mg/dL — ABNORMAL HIGH (ref 65–99)
POTASSIUM: 3.8 mmol/L (ref 3.5–5.1)
Sodium: 139 mmol/L (ref 135–145)
TOTAL PROTEIN: 7.4 g/dL (ref 6.5–8.1)

## 2016-01-02 LAB — TSH: TSH: 1.481 u[IU]/mL (ref 0.350–4.500)

## 2016-01-02 NOTE — Progress Notes (Signed)
Patient ID: [name] [name], female   DOB: 04-01-1929, 82 y.o.   MRN: [id number]        Primary Care Physician: Raelene Bott, MD Referring Physician: Dr. Sabino Donovan is a 82 y.o. female with a h/o persistent afib failing tikosyn in the past.  She presented to [facility] ER with c/o palpitations, near syncope and nausea, and was admitted 4/7 thru 4/9. Dicussed cardioversion but her INR was sub therapeutic at 1.8. She felt better the next day and wished not to stay for cardioversion but d/c home. She had increas in PVC's while in hospital and toprol was increased to 200 mg bid. Tikosyn continued. She believes she went back in rhythm after discharge.  Pt asked to be seen 4/27, due to increased afib burden over the last few weeks. She felt very fatigued. She continued on tikosyn and discussed option of switching  her over to amiodarone, but review of her echo 8/16 shows structural disease and her valve situation could be worse driving the afib. She is agreeable to look at repeat echo and then decide on plan going forward.  Returns 5/28, echo performed and valve status stable, but presents in afib and c/o of being weak in afib. Per discussion with Dr. Rayann Heman last week if Tikosyn not keeping in rhythm, then stop drug and try amiodarone load. She has been off Tikosyn since Friday am so will start 200 mg amiodarone  this pm and load at 200 mg bid. She has been made aware that addition of amiodarone will thin the blood and has been asked to have her INR checked this Friday and EKG done and will fax results. In anticipation of needed DCCV with amiodarone load will ask for INR's to be done weekly for the next month and faxed here. It is more convenient to have done at [facility] office in [facility] than arrange transportation here.   She returns 5/22 after loading 2 weeks on amiodarone. She feels better on drug. Is in aflutter, rate controlled. INR's have been therapeutic at PCP office and will continue to  have drawn weekly. Will go ahead and schedule DCCV for 6/9 pending therapeutic INR's. Since decreasing metoprolol dose with addition of  Amiodarone, her nausea has improved.  Returns 7/5 and is in [place] but has been feeling dizzy since last visit here. Last visit metoprolol was decreased due to low heart rates which may have been responsible for symptoms, but since taking less metoprolol , pt 's HR readings are now in the 60's, but she is not noticing less dizziness. She notices more with moving her head or walking. Her PCP last reduced her ACE inhibitor but has not helped dizziness and BP went up so she resumed prior dose. She sees her PCP again this afternoon and the pt states he may refer her to neuro or do a head CT. She has stayed in [place] as far as she knows. She continues on warfarin. Amiodarone was decreased to 100 mg daily.  Returns to afib clinic today and dizziness, that she has had for many weeks, has resolved for the last 3-5 days. BP is much better controlled today and she reports that her PCP did have a head CT and carotid U/S and pt reports normal.EKG shows SR and has noticed any irregular heart beat. She is c/o of some occasional chest tightness that will resolve when she goes to sleep.  F/u 10/24, is SR and feels well. Dizziness greatly improved.Has some unsteadiness at  times when walking but no falls.   Today, she denies symptoms of palpitations, chest pain, shortness of breath, orthopnea, PND, lower extremity edema,  presyncope, syncope, or neurologic sequela. Positive for dizziness. The patient is tolerating medications without difficulties and is otherwise without complaint today.   Past Medical History:  Diagnosis Date  . Atrial fibrillation (HCC)    persistent, coumadin per PCP;  echo 06/06/09: EF 60%, mild LVH, Gr 1 diast dysfx, AV gradient mean 9, mod MR, mod LAE;  Myoview 11/09: normal, EF 81%  . Back pain   . Cancer ([place])   . CVA (cerebral vascular accident) ([place])    2006  .  Dyspnea   . Fatigue   . Giant cell arteritis [facility])    sees dr. at [facility]  . H/O: hysterectomy   . HTN (hypertension)   . Hypercholesteremia    Secondary to vascular disease  . Subclavian artery stenosis, right (HCC)    dopplers done 2/12;  carotids 2/12: 0-39% bilateral  . TIA (transient ischemic attack)    Past Surgical History:  Procedure Laterality Date  . ABDOMINAL HYSTERECTOMY    . APPENDECTOMY    . CARDIOVERSION N/A 08/18/2015   Procedure: CARDIOVERSION;  Surgeon: Dorothy Spark, MD;  Location: [place];  Service: Cardiovascular;  Laterality: N/A;  . KNEE SURGERY  3/06   Left  . PROLAPSED UTERINE FIBROID LIGATION  7/05    Current Outpatient Prescriptions  Medication Sig Dispense Refill  . amiodarone (PACERONE) 200 MG tablet Take 0.5 tablets (100 mg total) by mouth daily. 60 tablet 3  . amLODipine (NORVASC) 5 MG tablet Take 5 mg by mouth daily.    Marland Kitchen aspirin 81 MG tablet Take 81 mg by mouth daily.    Marland Kitchen atorvastatin (LIPITOR) 20 MG tablet Take 0.5 tablets by mouth every other day.   11  . diltiazem (CARDIZEM) 30 MG tablet Take 30 mg by mouth every 6 (six) hours as needed. Reported on 09/13/2015    . metoprolol succinate (TOPROL-XL) 100 MG 24 hr tablet Take 1/2 tablet (50mg ) by mouth twice a day 60 tablet 3  . mirtazapine (REMERON) 15 MG tablet Take 7.5 mg by mouth at bedtime.    . naphazoline-pheniramine (NAPHCON-A) 0.025-0.3 % ophthalmic solution Place 1 drop into both eyes daily as needed for irritation.    Marland Kitchen warfarin (COUMADIN) 5 MG tablet Take 2.5-5 mg by mouth as directed. Take 2.5mg  mon wed fridays and sundays whole tablet rest of days    . zolpidem (AMBIEN) 10 MG tablet Take 5 mg by mouth at bedtime as needed for sleep.    . nitroGLYCERIN (NITROSTAT) 0.4 MG SL tablet Place 1 tablet (0.4 mg total) under the tongue every 5 (five) minutes as needed for chest pain. 25 tablet 3   No current facility-administered medications for this encounter.     Allergies  Allergen  Reactions  . Ciprofloxacin Other (See Comments)    tachycardia  . Escitalopram     "lips puffed", "tight in chest", "nasuea", "dizzy"  . Ranitidine Diarrhea    Social History   Social History  . Marital status: Widowed    Spouse name: N/A  . Number of children: N/A  . Years of education: N/A   Occupational History  . Not on file.   Social History Main Topics  . Smoking status: Never Smoker  . Smokeless tobacco: Not on file  . Alcohol use No  . Drug use: No  . Sexual activity: Not on file  Other Topics Concern  . Not on file   Social History Narrative   She is happily married. Her husband was with her today. She lives between [place] and [place].  Retired Advertising account planner.    Family History  Problem Relation Age of Onset  . Hypertension    . Stroke Neg Hx   . Diabetes type II Neg Hx   . Hypertension Daughter     ROS- All systems are reviewed and negative except as per the HPI above  Physical Exam: Vitals:   01/02/16 1345  BP: 140/88  Pulse: 67  Weight: 126 lb 6.4 oz (57.3 kg)  Height: 5\' 2"  (1.575 m)    GEN- The patient is well appearing, alert and oriented x 3 today.   Head- normocephalic, atraumatic Eyes-  Sclera clear, conjunctiva pink Ears- hearing intact Oropharynx- clear Neck- supple, no JVP, bilateral soft bruits but may be radiation of cardiac murmur. Lymph- no cervical lymphadenopathy Lungs- Clear to ausculation bilaterally, normal work of breathing Heart- regular rate and rhythm,  2/6 systolic murmur, no rubs or gallops, PMI not laterally displaced GI- soft, NT, ND, + BS Extremities- no clubbing, cyanosis, or edema MS- no significant deformity or atrophy Skin- no rash or lesion Psych- euthymic mood, full affect Neuro- strength and sensation are intact  EKG- SR at 67 bpm, pr int 162 ms, qrs int 102 ms, qtc 460 ms Epic records reiewed Echo--  Left ventricle: The cavity size was normal. There was mild focal  basal hypertrophy of the  septum. Systolic function was normal.  The estimated ejection fraction was in the range of 55% to 60%.  Wall motion was normal; there were no regional wall motion  abnormalities. The study is not technically sufficient to allow  evaluation of LV diastolic function. - Aortic valve: Valve mobility was restricted. There was very mild  stenosis. There was mild regurgitation. Peak velocity (S): 241  cm/s. Mean gradient (S): 13 mm Hg. - Mitral valve: Moderately calcified annulus. There was mild to  moderate regurgitation directed posteriorly. - Left atrium: The atrium was mildly dilated.  Impressions:  - Compared to the prior study, there has been no significant  interval change.    Assessment and Plan: 1. Persistent  afib Failed tikosyn, now on amiodarone s/p successful cardioversion  Continue amiodarone 100 mg a day,  maintaining SR, dizziness improved TSH/cmet drawn today  2. HTN  Much better controlled Per PCP   F/u afib clinic 6 months   Butch Penny C. Deztinee Lohmeyer, [facility] [address] [place], [place] [address] [phone]

## 2016-03-24 ENCOUNTER — Inpatient Hospital Stay (HOSPITAL_COMMUNITY)
Admission: AD | Admit: 2016-03-24 | Payer: Self-pay | Source: Other Acute Inpatient Hospital | Admitting: Internal Medicine

## 2016-06-18 ENCOUNTER — Encounter (HOSPITAL_COMMUNITY): Payer: Self-pay | Admitting: Nurse Practitioner

## 2016-06-18 ENCOUNTER — Ambulatory Visit (HOSPITAL_COMMUNITY)
Admission: RE | Admit: 2016-06-18 | Discharge: 2016-06-18 | Disposition: A | Discharge status: Home / Self Care | Payer: Medicare HMO | Source: Ambulatory Visit | Attending: Nurse Practitioner | Admitting: Nurse Practitioner

## 2016-06-18 VITALS — BP 142/84 | HR 95 | Ht 62.0 in | Wt 118.0 lb

## 2016-06-18 DIAGNOSIS — I481 Persistent atrial fibrillation: Secondary | ICD-10-CM | POA: Diagnosis not present

## 2016-06-18 DIAGNOSIS — Z79899 Other long term (current) drug therapy: Secondary | ICD-10-CM | POA: Diagnosis not present

## 2016-06-18 DIAGNOSIS — I10 Essential (primary) hypertension: Secondary | ICD-10-CM | POA: Diagnosis not present

## 2016-06-18 DIAGNOSIS — Z7982 Long term (current) use of aspirin: Secondary | ICD-10-CM | POA: Diagnosis not present

## 2016-06-18 DIAGNOSIS — I480 Paroxysmal atrial fibrillation: Secondary | ICD-10-CM

## 2016-06-18 DIAGNOSIS — Z8673 Personal history of transient ischemic attack (TIA), and cerebral infarction without residual deficits: Secondary | ICD-10-CM | POA: Insufficient documentation

## 2016-06-18 DIAGNOSIS — E7800 Pure hypercholesterolemia, unspecified: Secondary | ICD-10-CM | POA: Diagnosis not present

## 2016-06-18 DIAGNOSIS — Z7901 Long term (current) use of anticoagulants: Secondary | ICD-10-CM | POA: Insufficient documentation

## 2016-06-18 LAB — COMPREHENSIVE METABOLIC PANEL
ALK PHOS: 164 U/L — AB (ref 38–126)
ALT: 29 U/L (ref 14–54)
ANION GAP: 12 (ref 5–15)
AST: 46 U/L — ABNORMAL HIGH (ref 15–41)
Albumin: 3.7 g/dL (ref 3.5–5.0)
BILIRUBIN TOTAL: 0.8 mg/dL (ref 0.3–1.2)
BUN: 15 mg/dL (ref 6–20)
CALCIUM: 9.4 mg/dL (ref 8.9–10.3)
CO2: 27 mmol/L (ref 22–32)
Chloride: 101 mmol/L (ref 101–111)
Creatinine, Ser: 0.8 mg/dL (ref 0.44–1.00)
GFR calc non Af Amer: >60 mL/min (ref >60)
Glucose, Bld: 131 mg/dL — ABNORMAL HIGH (ref 65–99)
Potassium: 3.5 mmol/L (ref 3.5–5.1)
SODIUM: 140 mmol/L (ref 135–145)
TOTAL PROTEIN: 7.7 g/dL (ref 6.5–8.1)

## 2016-06-18 LAB — TSH: TSH: 1.845 u[IU]/mL (ref 0.350–4.500)

## 2016-06-18 NOTE — Progress Notes (Signed)
Patient ID: [name] [name], female   DOB: [birth year], 83 y.o.   MRN: [id number]        Primary Care Physician: Raelene Bott, MD Referring Physician: Dr. Sabino Donovan is a 83 y.o. female with a h/o persistent afib failing tikosyn in the past.  She presented to [facility] ER with c/o palpitations, near syncope and nausea, and was admitted 4/7 thru 4/9. Dicussed cardioversion but her INR was sub therapeutic at 1.8. She felt better the next day and wished not to stay for cardioversion but d/c home. She had increas in PVC's while in hospital and toprol was increased to 200 mg bid. Tikosyn continued. She believes she went back in rhythm after discharge.  Pt asked to be seen 4/27, due to increased afib burden over the last few weeks. She felt very fatigued. She continued on tikosyn and discussed option of switching  her over to amiodarone, but review of her echo 8/16 shows structural disease and her valve situation could be worse driving the afib. She is agreeable to look at repeat echo and then decide on plan going forward.  Returns 5/28, echo performed and valve status stable, but presents in afib and c/o of being weak in afib. Per discussion with Dr. Rayann Heman last week if Tikosyn not keeping in rhythm, then stop drug and try amiodarone load. She has been off Tikosyn since Friday am so will start 200 mg amiodarone  this pm and load at 200 mg bid. She has been made aware that addition of amiodarone will thin the blood and has been asked to have her INR checked this Friday and EKG done and will fax results. In anticipation of needed DCCV with amiodarone load will ask for INR's to be done weekly for the next month and faxed here. It is more convenient to have done at [facility] office in [facility] than arrange transportation here.   She returns 5/22 after loading 2 weeks on amiodarone. She feels better on drug. Is in aflutter, rate controlled. INR's have been therapeutic at PCP office and will continue to  have drawn weekly. Will go ahead and schedule DCCV for 6/9 pending therapeutic INR's. Since decreasing metoprolol dose with addition of  Amiodarone, her nausea has improved.  Returns 7/5 and is in [place] but has been feeling dizzy since last visit here. Last visit metoprolol was decreased due to low heart rates which may have been responsible for symptoms, but since taking less metoprolol , pt 's HR readings are now in the 60's, but she is not noticing less dizziness. She notices more with moving her head or walking. Her PCP last reduced her ACE inhibitor but has not helped dizziness and BP went up so she resumed prior dose. She sees her PCP again this afternoon and the pt states he may refer her to neuro or do a head CT. She has stayed in [place] as far as she knows. She continues on warfarin. Amiodarone was decreased to 100 mg daily.  Returns to afib clinic today and dizziness, that she has had for many weeks, has resolved for the last 3-5 days. BP is much better controlled today and she reports that her PCP did have a head CT and carotid U/S and pt reports normal.EKG shows SR and has noticed any irregular heart beat. She is c/o of some occasional chest tightness that will resolve when she goes to sleep.  F/u 10/24, is SR and feels well. Dizziness greatly improved.Has some unsteadiness at  times when walking but no falls.   F/u in afib clinic. Unfortunately, she had a mechanical fall in church 03/24/16 with fractured pelvis and was treated at [facility] and then a rehab center in [place]. She in now home. She reports that no one reported that she was in Afib during her hospital stay. She remains on amiodarone at 100 mg a day and warfarin. She is ambulating with a cane but is in a lot of pain.  Today, she denies symptoms of palpitations, chest pain, shortness of breath, orthopnea, PND, lower extremity edema,  presyncope, syncope, or neurologic sequela. Positive for dizziness. The patient is tolerating medications without  difficulties and is otherwise without complaint today.   Past Medical History:  Diagnosis Date  . Atrial fibrillation (HCC)    persistent, coumadin per PCP;  echo 06/06/09: EF 60%, mild LVH, Gr 1 diast dysfx, AV gradient mean 9, mod MR, mod LAE;  Myoview 11/09: normal, EF 81%  . Back pain   . Cancer ([place])   . CVA (cerebral vascular accident) ([place])    2006  . Dyspnea   . Fatigue   . Giant cell arteritis [facility])    sees dr. at [facility]  . H/O: hysterectomy   . HTN (hypertension)   . Hypercholesteremia    Secondary to vascular disease  . Subclavian artery stenosis, right (HCC)    dopplers done 2/12;  carotids 2/12: 0-39% bilateral  . TIA (transient ischemic attack)    Past Surgical History:  Procedure Laterality Date  . ABDOMINAL HYSTERECTOMY    . APPENDECTOMY    . CARDIOVERSION N/A 08/18/2015   Procedure: CARDIOVERSION;  Surgeon: Dorothy Spark, MD;  Location: [place];  Service: Cardiovascular;  Laterality: N/A;  . KNEE SURGERY  3/06   Left  . PROLAPSED UTERINE FIBROID LIGATION  7/05    Current Outpatient Prescriptions  Medication Sig Dispense Refill  . amiodarone (PACERONE) 200 MG tablet Take 0.5 tablets (100 mg total) by mouth daily. 60 tablet 3  . amLODipine (NORVASC) 5 MG tablet Take 5 mg by mouth daily.    Marland Kitchen aspirin 81 MG tablet Take 81 mg by mouth daily.    Marland Kitchen atorvastatin (LIPITOR) 20 MG tablet Take 0.5 tablets by mouth every other day.   11  . diltiazem (CARDIZEM) 30 MG tablet Take 30 mg by mouth every 6 (six) hours as needed. Reported on 09/13/2015    . metoprolol succinate (TOPROL-XL) 100 MG 24 hr tablet Take 1/2 tablet (50mg ) by mouth twice a day 60 tablet 3  . mirtazapine (REMERON) 15 MG tablet Take 7.5 mg by mouth at bedtime.    . naphazoline-pheniramine (NAPHCON-A) 0.025-0.3 % ophthalmic solution Place 1 drop into both eyes daily as needed for irritation.    . sertraline (ZOLOFT) 50 MG tablet Take 50 mg by mouth daily.    Marland Kitchen warfarin (COUMADIN) 5 MG tablet Take 2.5-5  mg by mouth as directed. Take 2.5mg  mon wed fridays and sundays whole tablet rest of days    . nitroGLYCERIN (NITROSTAT) 0.4 MG SL tablet Place 1 tablet (0.4 mg total) under the tongue every 5 (five) minutes as needed for chest pain. 25 tablet 3   No current facility-administered medications for this encounter.     Allergies  Allergen Reactions  . Ciprofloxacin Other (See Comments)    tachycardia  . Escitalopram     "lips puffed", "tight in chest", "nasuea", "dizzy"  . Ranitidine Diarrhea    Social History   Social History  .  Marital status: Widowed    Spouse name: N/A  . Number of children: N/A  . Years of education: N/A   Occupational History  . Not on file.   Social History Main Topics  . Smoking status: Never Smoker  . Smokeless tobacco: Never Used  . Alcohol use No  . Drug use: No  . Sexual activity: Not on file   Other Topics Concern  . Not on file   Social History Narrative   She is happily married. Her husband was with her today. She lives between [place] and [place].  Retired Advertising account planner.    Family History  Problem Relation Age of Onset  . Hypertension    . Stroke Neg Hx   . Diabetes type II Neg Hx   . Hypertension Daughter     ROS- All systems are reviewed and negative except as per the HPI above  Physical Exam: Vitals:   06/18/16 1326  BP: (!) 142/84  Pulse: 95  Weight: 118 lb (53.5 kg)  Height: 5\' 2"  (1.575 m)    GEN- The patient is well appearing, alert and oriented x 3 today.   Head- normocephalic, atraumatic Eyes-  Sclera clear, conjunctiva pink Ears- hearing intact Oropharynx- clear Neck- supple, no JVP, bilateral soft bruits but may be radiation of cardiac murmur. Lymph- no cervical lymphadenopathy Lungs- Clear to ausculation bilaterally, normal work of breathing Heart-irregular rate and rhythm,  2/6 systolic murmur, no rubs or gallops, PMI not laterally displaced GI- soft, NT, ND, + BS Extremities- no clubbing, cyanosis, or  edema MS- no significant deformity or atrophy Skin- no rash or lesion Psych- euthymic mood, full affect Neuro- strength and sensation are intact  EKG- afib at  at 95 bpm, pr int 96 ms, qrs int 96 ms, qtc 497 ms Epic records reiewed Echo--  Left ventricle: The cavity size was normal. There was mild focal  basal hypertrophy of the septum. Systolic function was normal.  The estimated ejection fraction was in the range of 55% to 60%.  Wall motion was normal; there were no regional wall motion  abnormalities. The study is not technically sufficient to allow  evaluation of LV diastolic function. - Aortic valve: Valve mobility was restricted. There was very mild  stenosis. There was mild regurgitation. Peak velocity (S): 241  cm/s. Mean gradient (S): 13 mm Hg. - Mitral valve: Moderately calcified annulus. There was mild to  moderate regurgitation directed posteriorly. - Left atrium: The atrium was mildly dilated.  Impressions:  - Compared to the prior study, there has been no significant  interval change.    Assessment and Plan: 1. Persistent  afib Hard been in SR on low dose amiodarone until recent fall and pelvis fx Rate controlled Continue amiodarone 100 mg a day without changes  tsh, cmet today  2. HTN  Stable Per PCP   F/u afib clinic one month   Butch Penny C. Zoi Devine, [facility] [address] [place], [place] [address] [phone]

## 2016-07-18 ENCOUNTER — Other Ambulatory Visit (HOSPITAL_COMMUNITY): Payer: Self-pay | Admitting: *Deleted

## 2016-07-18 MED ORDER — AMIODARONE HCL 200 MG PO TABS: 100.0000 mg | ORAL_TABLET | Freq: Every day | ORAL | 3 refills | Status: DC

## 2016-07-23 ENCOUNTER — Encounter (HOSPITAL_COMMUNITY): Payer: Self-pay | Admitting: Nurse Practitioner

## 2016-07-23 ENCOUNTER — Ambulatory Visit (HOSPITAL_COMMUNITY)
Admission: RE | Admit: 2016-07-23 | Discharge: 2016-07-23 | Disposition: A | Discharge status: Home / Self Care | Payer: Medicare HMO | Source: Ambulatory Visit | Attending: Nurse Practitioner | Admitting: Nurse Practitioner

## 2016-07-23 VITALS — BP 158/92 | HR 82 | Ht 62.0 in | Wt 117.0 lb

## 2016-07-23 DIAGNOSIS — E7800 Pure hypercholesterolemia, unspecified: Secondary | ICD-10-CM | POA: Diagnosis not present

## 2016-07-23 DIAGNOSIS — Z79899 Other long term (current) drug therapy: Secondary | ICD-10-CM | POA: Diagnosis not present

## 2016-07-23 DIAGNOSIS — Z7982 Long term (current) use of aspirin: Secondary | ICD-10-CM | POA: Insufficient documentation

## 2016-07-23 DIAGNOSIS — Z7901 Long term (current) use of anticoagulants: Secondary | ICD-10-CM | POA: Diagnosis not present

## 2016-07-23 DIAGNOSIS — I10 Essential (primary) hypertension: Secondary | ICD-10-CM | POA: Insufficient documentation

## 2016-07-23 DIAGNOSIS — I4819 Other persistent atrial fibrillation: Secondary | ICD-10-CM

## 2016-07-23 DIAGNOSIS — I481 Persistent atrial fibrillation: Secondary | ICD-10-CM | POA: Insufficient documentation

## 2016-07-23 DIAGNOSIS — Z8673 Personal history of transient ischemic attack (TIA), and cerebral infarction without residual deficits: Secondary | ICD-10-CM | POA: Insufficient documentation

## 2016-07-23 DIAGNOSIS — I4891 Unspecified atrial fibrillation: Secondary | ICD-10-CM | POA: Diagnosis present

## 2016-07-23 NOTE — Progress Notes (Signed)
Patient ID: [name] [name], female   DOB: [birth year], 83 y.o.   MRN: [id number]        Primary Care Physician: Raelene Bott, MD Referring Physician: Dr. Sabino Donovan is a 83 y.o. female with a h/o persistent afib failing tikosyn in the past.  She presented to [facility] ER with c/o palpitations, near syncope and nausea, and was admitted 4/7 thru 4/9. Dicussed cardioversion but her INR was sub therapeutic at 1.8. She felt better the next day and wished not to stay for cardioversion but d/c home. She had increas in PVC's while in hospital and toprol was increased to 200 mg bid. Tikosyn continued. She believes she went back in rhythm after discharge.  Pt asked to be seen 4/27, due to increased afib burden over the last few weeks. She felt very fatigued. She continued on tikosyn and discussed option of switching  her over to amiodarone, but review of her echo 8/16 shows structural disease and her valve situation could be worse driving the afib. She is agreeable to look at repeat echo and then decide on plan going forward.  Returns 5/28, echo performed and valve status stable, but presents in afib and c/o of being weak in afib. Per discussion with Dr. Rayann Heman last week if Tikosyn not keeping in rhythm, then stop drug and try amiodarone load. She has been off Tikosyn since Friday am so will start 200 mg amiodarone  this pm and load at 200 mg bid. She has been made aware that addition of amiodarone will thin the blood and has been asked to have her INR checked this Friday and EKG done and will fax results. In anticipation of needed DCCV with amiodarone load will ask for INR's to be done weekly for the next month and faxed here. It is more convenient to have done at [facility] office in [facility] than arrange transportation here.   She returns 5/22 after loading 2 weeks on amiodarone. She feels better on drug. Is in aflutter, rate controlled. INR's have been therapeutic at PCP office and will continue to  have drawn weekly. Will go ahead and schedule DCCV for 6/9 pending therapeutic INR's. Since decreasing metoprolol dose with addition of  Amiodarone, her nausea has improved.  Returns 7/5 and is in [place] but has been feeling dizzy since last visit here. Last visit metoprolol was decreased due to low heart rates which may have been responsible for symptoms, but since taking less metoprolol , pt 's HR readings are now in the 60's, but she is not noticing less dizziness. She notices more with moving her head or walking. Her PCP last reduced her ACE inhibitor but has not helped dizziness and BP went up so she resumed prior dose. She sees her PCP again this afternoon and the pt states he may refer her to neuro or do a head CT. She has stayed in [place] as far as she knows. She continues on warfarin. Amiodarone was decreased to 100 mg daily.  Returns to afib clinic today and dizziness, that she has had for many weeks, has resolved for the last 3-5 days. BP is much better controlled today and she reports that her PCP did have a head CT and carotid U/S and pt reports normal.EKG shows SR and has noticed any irregular heart beat. She is c/o of some occasional chest tightness that will resolve when she goes to sleep.  F/u 10/24, is SR and feels well. Dizziness greatly improved.Has some unsteadiness  at times when walking but no falls.   F/u in afib clinic. Unfortunately, she had a mechanical fall in church 03/24/16 with fractured pelvis and was treated at [facility] and then a rehab center in [place]. She in now home. She reports that no one reported that she was in Afib during her hospital stay. She remains on amiodarone at 100 mg a day and warfarin. She is ambulating with a cane but is in a lot of pain.  F/u in afib clinic 5/15. She is still in afib on low dose amiodarone, rate controlled.. It is hard for her to determine if the afib is bothering her because she is still in a lot of pain from her fall and is having a lot of back  pain.   Today, she denies symptoms of palpitations, chest pain, shortness of breath, orthopnea, PND, lower extremity edema,  presyncope, syncope, or neurologic sequela. Positive for dizziness. The patient is tolerating medications without difficulties and is otherwise without complaint today.   Past Medical History:  Diagnosis Date  . Atrial fibrillation (HCC)    persistent, coumadin per PCP;  echo 06/06/09: EF 60%, mild LVH, Gr 1 diast dysfx, AV gradient mean 9, mod MR, mod LAE;  Myoview 11/09: normal, EF 81%  . Back pain   . Cancer ([place])   . CVA (cerebral vascular accident) ([place])    2006  . Dyspnea   . Fatigue   . Giant cell arteritis [facility])    sees dr. at [facility]  . H/O: hysterectomy   . HTN (hypertension)   . Hypercholesteremia    Secondary to vascular disease  . Subclavian artery stenosis, right (HCC)    dopplers done 2/12;  carotids 2/12: 0-39% bilateral  . TIA (transient ischemic attack)    Past Surgical History:  Procedure Laterality Date  . ABDOMINAL HYSTERECTOMY    . APPENDECTOMY    . CARDIOVERSION N/A 08/18/2015   Procedure: CARDIOVERSION;  Surgeon: Dorothy Spark, MD;  Location: [place];  Service: Cardiovascular;  Laterality: N/A;  . KNEE SURGERY  3/06   Left  . PROLAPSED UTERINE FIBROID LIGATION  7/05    Current Outpatient Prescriptions  Medication Sig Dispense Refill  . amiodarone (PACERONE) 200 MG tablet Take 0.5 tablets (100 mg total) by mouth daily. 45 tablet 3  . amLODipine (NORVASC) 5 MG tablet Take 5 mg by mouth daily.    Marland Kitchen aspirin 81 MG tablet Take 81 mg by mouth daily.    Marland Kitchen atorvastatin (LIPITOR) 20 MG tablet Take 0.5 tablets by mouth every other day.   11  . diltiazem (CARDIZEM) 30 MG tablet Take 30 mg by mouth every 6 (six) hours as needed. Reported on 09/13/2015    . HYDROcodone-acetaminophen (NORCO/VICODIN) 5-325 MG tablet Take 1 tablet by mouth every 6 (six) hours as needed for moderate pain.    . metoprolol succinate (TOPROL-XL) 100 MG 24 hr tablet  Take 1/2 tablet (50mg ) by mouth twice a day 60 tablet 3  . mirtazapine (REMERON) 15 MG tablet Take 7.5 mg by mouth at bedtime.    . naphazoline-pheniramine (NAPHCON-A) 0.025-0.3 % ophthalmic solution Place 1 drop into both eyes daily as needed for irritation.    . nitroGLYCERIN (NITROSTAT) 0.4 MG SL tablet Place 1 tablet (0.4 mg total) under the tongue every 5 (five) minutes as needed for chest pain. 25 tablet 3  . sertraline (ZOLOFT) 50 MG tablet Take 50 mg by mouth daily.    Marland Kitchen warfarin (COUMADIN) 5 MG tablet Take  2.5-5 mg by mouth as directed. Take 2.5mg  mon wed fridays and sundays whole tablet rest of days     No current facility-administered medications for this encounter.     Allergies  Allergen Reactions  . Ciprofloxacin Other (See Comments)    tachycardia  . Escitalopram     "lips puffed", "tight in chest", "nasuea", "dizzy"  . Ranitidine Diarrhea    Social History   Social History  . Marital status: Widowed    Spouse name: N/A  . Number of children: N/A  . Years of education: N/A   Occupational History  . Not on file.   Social History Main Topics  . Smoking status: Never Smoker  . Smokeless tobacco: Never Used  . Alcohol use No  . Drug use: No  . Sexual activity: Not on file   Other Topics Concern  . Not on file   Social History Narrative   She is happily married. Her husband was with her today. She lives between [place] and [place].  Retired Advertising account planner.    Family History  Problem Relation Age of Onset  . Hypertension Unknown   . Stroke Neg Hx   . Diabetes type II Neg Hx   . Hypertension Daughter     ROS- All systems are reviewed and negative except as per the HPI above  Physical Exam: Vitals:   07/23/16 1328  BP: (!) 158/92  Pulse: 82  Weight: 117 lb (53.1 kg)  Height: 5\' 2"  (1.575 m)    GEN- The patient is well appearing, alert and oriented x 3 today.   Head- normocephalic, atraumatic Eyes-  Sclera clear, conjunctiva pink Ears- hearing  intact Oropharynx- clear Neck- supple, no JVP, bilateral soft bruits but may be radiation of cardiac murmur. Lymph- no cervical lymphadenopathy Lungs- Clear to ausculation bilaterally, normal work of breathing Heart-irregular rate and rhythm,  2/6 systolic murmur, no rubs or gallops, PMI not laterally displaced GI- soft, NT, ND, + BS Extremities- no clubbing, cyanosis, or edema MS- no significant deformity or atrophy Skin- no rash or lesion Psych- euthymic mood, full affect Neuro- strength and sensation are intact  EKG- afib at  at 82 bpm, pr int 82 ms, qrs int 96 ms, qtc 493  ms Epic records reiewed Echo--  Left ventricle: The cavity size was normal. There was mild focal  basal hypertrophy of the septum. Systolic function was normal.  The estimated ejection fraction was in the range of 55% to 60%.  Wall motion was normal; there were no regional wall motion  abnormalities. The study is not technically sufficient to allow  evaluation of LV diastolic function. - Aortic valve: Valve mobility was restricted. There was very mild  stenosis. There was mild regurgitation. Peak velocity (S): 241  cm/s. Mean gradient (S): 13 mm Hg. - Mitral valve: Moderately calcified annulus. There was mild to  moderate regurgitation directed posteriorly. - Left atrium: The atrium was mildly dilated.  Impressions:  - Compared to the prior study, there has been no significant  interval change.    Assessment and Plan: 1. Persistent  afib Had been in SR on low dose amiodarone until recent fall and pelvis fx Rate controlled Continue amiodarone 100 mg a day without changes  Pt would like to try a cardioversion but would like to wait until after she sees the pain specialist 7/11, expecting a back injection and warfarin would have to be stopped.  She will let me know when she is finished for back injection  and then we can plan for cardioversion. She will need 4 weeks therapeutic INR's.  2. HTN    Stable Per PCP   F/u afib clinic as per pt later this summer   [place]. Ever Gustafson, [facility] [address] [place], [place] [address] [phone]

## 2016-08-06 ENCOUNTER — Other Ambulatory Visit (HOSPITAL_COMMUNITY): Payer: Self-pay | Admitting: *Deleted

## 2016-08-06 MED ORDER — METOPROLOL SUCCINATE ER 100 MG PO TB24: ORAL_TABLET | ORAL | 2 refills | Status: DC

## 2016-11-19 ENCOUNTER — Ambulatory Visit (HOSPITAL_COMMUNITY)
Admission: RE | Admit: 2016-11-19 | Discharge: 2016-11-19 | Disposition: A | Discharge status: Home / Self Care | Payer: Medicare HMO | Source: Ambulatory Visit | Attending: Nurse Practitioner | Admitting: Nurse Practitioner

## 2016-11-19 ENCOUNTER — Encounter (HOSPITAL_COMMUNITY): Payer: Self-pay | Admitting: Nurse Practitioner

## 2016-11-19 VITALS — BP 122/74 | HR 81 | Ht 62.0 in | Wt 110.4 lb

## 2016-11-19 DIAGNOSIS — Z8249 Family history of ischemic heart disease and other diseases of the circulatory system: Secondary | ICD-10-CM | POA: Insufficient documentation

## 2016-11-19 DIAGNOSIS — Z90710 Acquired absence of both cervix and uterus: Secondary | ICD-10-CM | POA: Diagnosis not present

## 2016-11-19 DIAGNOSIS — Z7902 Long term (current) use of antithrombotics/antiplatelets: Secondary | ICD-10-CM | POA: Diagnosis not present

## 2016-11-19 DIAGNOSIS — A0472 Enterocolitis due to Clostridium difficile, not specified as recurrent: Secondary | ICD-10-CM | POA: Insufficient documentation

## 2016-11-19 DIAGNOSIS — I481 Persistent atrial fibrillation: Secondary | ICD-10-CM

## 2016-11-19 DIAGNOSIS — I350 Nonrheumatic aortic (valve) stenosis: Secondary | ICD-10-CM | POA: Insufficient documentation

## 2016-11-19 DIAGNOSIS — I10 Essential (primary) hypertension: Secondary | ICD-10-CM | POA: Diagnosis not present

## 2016-11-19 DIAGNOSIS — I4891 Unspecified atrial fibrillation: Secondary | ICD-10-CM | POA: Insufficient documentation

## 2016-11-19 DIAGNOSIS — E7800 Pure hypercholesterolemia, unspecified: Secondary | ICD-10-CM | POA: Insufficient documentation

## 2016-11-19 DIAGNOSIS — Z9889 Other specified postprocedural states: Secondary | ICD-10-CM | POA: Insufficient documentation

## 2016-11-19 DIAGNOSIS — Z8673 Personal history of transient ischemic attack (TIA), and cerebral infarction without residual deficits: Secondary | ICD-10-CM | POA: Insufficient documentation

## 2016-11-19 DIAGNOSIS — Z7982 Long term (current) use of aspirin: Secondary | ICD-10-CM | POA: Insufficient documentation

## 2016-11-19 DIAGNOSIS — I4819 Other persistent atrial fibrillation: Secondary | ICD-10-CM

## 2016-11-19 DIAGNOSIS — Z881 Allergy status to other antibiotic agents status: Secondary | ICD-10-CM | POA: Diagnosis not present

## 2016-11-19 DIAGNOSIS — Z888 Allergy status to other drugs, medicaments and biological substances status: Secondary | ICD-10-CM | POA: Insufficient documentation

## 2016-11-19 DIAGNOSIS — Z859 Personal history of malignant neoplasm, unspecified: Secondary | ICD-10-CM | POA: Diagnosis not present
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
## 2017-03-03 ENCOUNTER — Encounter: Payer: Self-pay | Admitting: *Deleted

## 2017-03-10 ENCOUNTER — Encounter: Payer: Self-pay | Admitting: Anesthesiology

## 2017-03-10 ENCOUNTER — Ambulatory Visit: Payer: Medicare HMO | Admitting: Anesthesiology

## 2017-03-10 ENCOUNTER — Ambulatory Visit
Admission: RE | Admit: 2017-03-10 | Discharge: 2017-03-10 | Disposition: A | Discharge status: Home / Self Care | Payer: Medicare HMO | Source: Ambulatory Visit | Attending: Ophthalmology | Admitting: Ophthalmology

## 2017-03-10 ENCOUNTER — Encounter
Admission: RE | Disposition: A | Discharge status: Home / Self Care | Payer: Self-pay | Source: Ambulatory Visit | Attending: Ophthalmology

## 2017-03-10 DIAGNOSIS — I2510 Atherosclerotic heart disease of native coronary artery without angina pectoris: Secondary | ICD-10-CM | POA: Diagnosis not present

## 2017-03-10 DIAGNOSIS — I4891 Unspecified atrial fibrillation: Secondary | ICD-10-CM | POA: Diagnosis not present

## 2017-03-10 DIAGNOSIS — H2512 Age-related nuclear cataract, left eye: Secondary | ICD-10-CM | POA: Diagnosis present

## 2017-03-10 DIAGNOSIS — I10 Essential (primary) hypertension: Secondary | ICD-10-CM | POA: Diagnosis not present

## 2017-03-10 DIAGNOSIS — Z79899 Other long term (current) drug therapy: Secondary | ICD-10-CM | POA: Insufficient documentation

## 2017-03-10 DIAGNOSIS — I739 Peripheral vascular disease, unspecified: Secondary | ICD-10-CM | POA: Diagnosis not present

## 2017-03-10 DIAGNOSIS — F329 Major depressive disorder, single episode, unspecified: Secondary | ICD-10-CM | POA: Diagnosis not present

## 2017-03-10 HISTORY — DX: Adverse effect of unspecified anesthetic, initial encounter: T41.45XA

## 2017-03-10 HISTORY — PX: CATARACT EXTRACTION W/PHACO: SHX586

## 2017-03-10 HISTORY — DX: Major depressive disorder, single episode, unspecified: F32.9

## 2017-03-10 HISTORY — DX: Other complications of anesthesia, initial encounter: T88.59XA

## 2017-03-10 HISTORY — DX: Cardiac murmur, unspecified: R01.1

## 2017-03-10 HISTORY — DX: Other specified postprocedural states: Z98.890

## 2017-03-10 HISTORY — DX: Unspecified thoracic, thoracolumbar and lumbosacral intervertebral disc disorder: M51.9

## 2017-03-10 HISTORY — DX: Atherosclerotic heart disease of native coronary artery without angina pectoris: I25.10

## 2017-03-10 HISTORY — DX: Depression, unspecified: F32.A

## 2017-03-10 HISTORY — DX: Cardiac arrhythmia, unspecified: I49.9

## 2017-03-10 HISTORY — DX: Other specified postprocedural states: R11.2

## 2017-03-10 LAB — GLUCOSE, CAPILLARY: Glucose-Capillary: 208 mg/dL — ABNORMAL HIGH (ref 65–99)

## 2017-03-10 SURGERY — PHACOEMULSIFICATION, CATARACT, WITH IOL INSERTION
Anesthesia: Monitor Anesthesia Care | Site: Eye | Laterality: Left | Wound class: Clean

## 2017-03-10 MED ORDER — POLYMYXIN B-TRIMETHOPRIM 10000-0.1 UNIT/ML-% OP SOLN
OPHTHALMIC | Status: AC
  Filled 2017-03-10: qty 10

## 2017-03-10 MED ORDER — CEFUROXIME OPHTHALMIC INJECTION 1 MG/0.1 ML
INJECTION | OPHTHALMIC | Status: DC | PRN
  Administered 2017-03-10: 1 mg via INTRACAMERAL

## 2017-03-10 MED ORDER — POVIDONE-IODINE 5 % OP SOLN
OPHTHALMIC | Status: DC | PRN
  Administered 2017-03-10: 1 via OPHTHALMIC

## 2017-03-10 MED ORDER — SODIUM CHLORIDE 0.9 % IV SOLN
INTRAVENOUS | Status: DC
  Administered 2017-03-10: 08:00:00 via INTRAVENOUS

## 2017-03-10 MED ORDER — EPINEPHRINE PF 1 MG/ML IJ SOLN
INTRAMUSCULAR | Status: AC
  Filled 2017-03-10: qty 1

## 2017-03-10 MED ORDER — FENTANYL CITRATE (PF) 100 MCG/2ML IJ SOLN
INTRAMUSCULAR | Status: DC | PRN
  Administered 2017-03-10 (×2): 25 ug via INTRAVENOUS

## 2017-03-10 MED ORDER — BSS IO SOLN
INTRAOCULAR | Status: DC | PRN
  Administered 2017-03-10: 4 mL via OPHTHALMIC

## 2017-03-10 MED ORDER — EPINEPHRINE PF 1 MG/ML IJ SOLN
INTRAOCULAR | Status: DC | PRN
  Administered 2017-03-10: 200 mL via OPHTHALMIC

## 2017-03-10 MED ORDER — POVIDONE-IODINE 5 % OP SOLN
OPHTHALMIC | Status: AC
  Filled 2017-03-10: qty 30

## 2017-03-10 MED ORDER — CARBACHOL 0.01 % IO SOLN
INTRAOCULAR | Status: DC | PRN
  Administered 2017-03-10: 0.5 mL via INTRAOCULAR

## 2017-03-10 MED ORDER — LIDOCAINE HCL (PF) 4 % IJ SOLN
INTRAMUSCULAR | Status: AC
  Filled 2017-03-10: qty 5

## 2017-03-10 MED ORDER — NA HYALUR & NA CHOND-NA HYALUR 0.4-0.35 ML IO KIT
PACK | INTRAOCULAR | Status: DC | PRN
  Administered 2017-03-10: .35 mL via INTRAOCULAR

## 2017-03-10 MED ORDER — ARMC OPHTHALMIC DILATING DROPS
1.0000 "application " | OPHTHALMIC | Status: AC
  Administered 2017-03-10 (×3): 1 via OPHTHALMIC

## 2017-03-10 MED ORDER — NA HYALUR & NA CHOND-NA HYALUR 0.55-0.5 ML IO KIT
PACK | INTRAOCULAR | Status: AC
  Filled 2017-03-10: qty 1.05

## 2017-03-10 MED ORDER — ARMC OPHTHALMIC DILATING DROPS
OPHTHALMIC | Status: AC
  Administered 2017-03-10: 1 via OPHTHALMIC
  Filled 2017-03-10: qty 0.4

## 2017-03-10 MED ORDER — FENTANYL CITRATE (PF) 100 MCG/2ML IJ SOLN
INTRAMUSCULAR | Status: AC
  Filled 2017-03-10: qty 2

## 2017-03-10 MED ORDER — POLYMYXIN B-TRIMETHOPRIM 10000-0.1 UNIT/ML-% OP SOLN
1.0000 [drp] | OPHTHALMIC | Status: DC | PRN
  Administered 2017-03-10: 1 [drp] via OPHTHALMIC

## 2017-03-10 MED ORDER — MOXIFLOXACIN HCL 0.5 % OP SOLN
OPHTHALMIC | Status: AC
  Filled 2017-03-10: qty 6

## 2017-03-10 SURGICAL SUPPLY — 16 items
GLOVE BIO SURGEON STRL SZ8 (GLOVE) ×2 IMPLANT
GLOVE BIOGEL M 6.5 STRL (GLOVE) ×2 IMPLANT
GLOVE SURG LX 7.5 STRW (GLOVE) ×1
GLOVE SURG LX STRL 7.5 STRW (GLOVE) ×1 IMPLANT
GOWN STRL REUS W/ TWL LRG LVL3 (GOWN DISPOSABLE) ×2 IMPLANT
GOWN STRL REUS W/TWL LRG LVL3 (GOWN DISPOSABLE) ×4
LABEL CATARACT MEDS ST (LABEL) ×2 IMPLANT
LENS IOL TECNIS ITEC 22.0 (Intraocular Lens) ×1 IMPLANT
PACK CATARACT (MISCELLANEOUS) ×2 IMPLANT
PACK CATARACT BRASINGTON LX (MISCELLANEOUS) ×2 IMPLANT
PACK EYE AFTER SURG (MISCELLANEOUS) ×2 IMPLANT
SOL BSS BAG (MISCELLANEOUS) ×2
SOLUTION BSS BAG (MISCELLANEOUS) ×1 IMPLANT
SYR 5ML LL (SYRINGE) ×2 IMPLANT
WATER STERILE IRR 250ML POUR (IV SOLUTION) ×2 IMPLANT
WIPE NON LINTING 3.25X3.25 (MISCELLANEOUS) ×2 IMPLANT

## 2017-03-10 NOTE — Transfer of Care (Signed)
Immediate Anesthesia Transfer of Care Note  Patient: [name] [name]  Procedure(s) Performed: CATARACT EXTRACTION PHACO AND INTRAOCULAR LENS PLACEMENT (IOC) (Left Eye)  Patient Location: PACU  Anesthesia Type:MAC  Level of Consciousness: awake, alert  and oriented  Airway & Oxygen Therapy: Patient Spontanous Breathing  Post-op Assessment: Report given to RN and Post -op Vital signs reviewed and stable  Post vital signs: Reviewed and stable  Last Vitals:  Vitals:   03/10/17 0747 03/10/17 0958  BP: (!) 140/100 (!) 126/114  Pulse: (!) 102 77  Resp: 17   Temp: (!) 35.6 C 36.7 C  SpO2: 100% 100%    Last Pain: There were no vitals filed for this visit.       Complications: No apparent anesthesia complications

## 2017-03-10 NOTE — Anesthesia Post-op Follow-up Note (Signed)
Anesthesia QCDR form completed.        

## 2017-03-10 NOTE — Discharge Instructions (Addendum)
Eye Surgery Discharge Instructions  Expect mild scratchy sensation or mild soreness. DO NOT RUB YOUR EYE!  The day of surgery:  Minimal physical activity, but bed rest is not required  No reading, computer work, or close hand work  No bending, lifting, or straining.  May watch TV  For 24 hours:  No driving, legal decisions, or alcoholic beverages  Safety precautions  Eat anything you prefer: It is better to start with liquids, then soup then solid foods.  _____ Eye patch should be worn until postoperative exam tomorrow.  ____ Solar shield eyeglasses should be worn for comfort in the sunlight/patch while sleeping  Resume all regular medications including aspirin or Coumadin if these were discontinued prior to surgery. You may shower, bathe, shave, or wash your hair. Tylenol may be taken for mild discomfort.  Call your doctor if you experience significant pain, nausea, or vomiting, fever > 101 or other signs of infection. [phone] or [phone] Specific instructions:  FOLLOW DR. Larrie Kass POSTOP EYE DROP SHEET AS REVIEWED.

## 2017-03-10 NOTE — Op Note (Signed)
OPERATIVE NOTE  [name] [name] [id number] 03/10/2017   PREOPERATIVE DIAGNOSIS:  Nuclear sclerotic cataract left eye. H25.12   POSTOPERATIVE DIAGNOSIS:    Nuclear sclerotic cataract left eye.     PROCEDURE:  Phacoemusification with posterior chamber intraocular lens placement of the left eye   LENS:   Implant Name Type Inv. Item Serial No. Manufacturer Lot No. LRB No. Used  LENS IOL DIOP 22.0 - [id number] Intraocular Lens LENS IOL DIOP 22.0 [id number] AMO  Left 1        ULTRASOUND TIME: 21 % of 1 minutes, 17 seconds.  CDE 16.2   SURGEON:  Wyonia Hough, MD   ANESTHESIA:  Topical with tetracaine drops and 2% Xylocaine jelly, augmented with 1% preservative-free intracameral lidocaine.    COMPLICATIONS:  None.   DESCRIPTION OF PROCEDURE:  The patient was identified in the holding room and transported to the operating room and placed in the supine position under the operating microscope.  The left eye was identified as the operative eye and it was prepped and draped in the usual sterile ophthalmic fashion.   A 1 millimeter clear-corneal paracentesis was made at the 1:30 position. 0.5 ml of preservative-free 1% lidocaine was injected into the anterior chamber.  The anterior chamber was filled with Viscoat viscoelastic.  A 2.4 millimeter keratome was used to make a near-clear corneal incision at the 10:30 position.  .  A curvilinear capsulorrhexis was made with a cystotome and capsulorrhexis forceps.  Balanced salt solution was used to hydrodissect and hydrodelineate the nucleus.   Phacoemulsification was then used in stop and chop fashion to remove the lens nucleus and epinucleus.  The remaining cortex was then removed using the irrigation and aspiration handpiece. Provisc was then placed into the capsular bag to distend it for lens placement.  A lens was then injected into the capsular bag.  The remaining viscoelastic was aspirated.   Wounds were hydrated with balanced salt  solution.  The anterior chamber was inflated to a physiologic pressure with balanced salt solution. Cefuroxime 0.1 ml of a 10mg /ml solution was injected into the anterior chamber for a dose of 1 mg of intracameral antibiotic at the completion of the case.  Miostat was placed into the anterior chamber to constrict the pupil.  No wound leaks were noted.  Topical Vigamox drops and Maxitrol ointment were applied to the eye.  The patient was taken to the recovery room in stable condition without complications of anesthesia or surgery  [name] 03/10/2017, 9:55 AM
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
## 2017-03-10 NOTE — H&P (Signed)
The History and Physical notes are on paper, have been signed, and are to be scanned. The patient remains stable and unchanged from the H&P.   Previous H&P reviewed, patient examined, and there are no changes.  [name] [name] 03/10/2017 8:13 AM   

## 2017-03-10 NOTE — Anesthesia Postprocedure Evaluation (Signed)
Anesthesia Post Note  Patient: [name] [name]  Procedure(s) Performed: CATARACT EXTRACTION PHACO AND INTRAOCULAR LENS PLACEMENT (IOC) (Left Eye)  Patient location during evaluation: PACU Anesthesia Type: MAC Level of consciousness: awake Pain management: pain level controlled Vital Signs Assessment: post-procedure vital signs reviewed and stable Respiratory status: spontaneous breathing Cardiovascular status: blood pressure returned to baseline Postop Assessment: no apparent nausea or vomiting Anesthetic complications: no     Last Vitals:  Vitals:   03/10/17 0747 03/10/17 0958  BP: (!) 140/100 (!) 126/114  Pulse: (!) 102 77  Resp: 17   Temp: (!) 35.6 C 36.7 C  SpO2: 100% 100%    Last Pain: There were no vitals filed for this visit.               Hedda Slade

## 2017-04-08 ENCOUNTER — Encounter: Payer: Self-pay | Admitting: *Deleted

## 2017-04-16 ENCOUNTER — Ambulatory Visit: Payer: Medicare HMO | Admitting: Anesthesiology

## 2017-04-16 ENCOUNTER — Ambulatory Visit
Admission: RE | Admit: 2017-04-16 | Discharge: 2017-04-16 | Disposition: A | Discharge status: Home / Self Care | Payer: Medicare HMO | Source: Ambulatory Visit | Attending: Ophthalmology | Admitting: Ophthalmology

## 2017-04-16 ENCOUNTER — Encounter: Payer: Self-pay | Admitting: Emergency Medicine

## 2017-04-16 ENCOUNTER — Encounter
Admission: RE | Disposition: A | Discharge status: Home / Self Care | Payer: Self-pay | Source: Ambulatory Visit | Attending: Ophthalmology

## 2017-04-16 DIAGNOSIS — Z881 Allergy status to other antibiotic agents status: Secondary | ICD-10-CM | POA: Diagnosis not present

## 2017-04-16 DIAGNOSIS — E119 Type 2 diabetes mellitus without complications: Secondary | ICD-10-CM | POA: Diagnosis not present

## 2017-04-16 DIAGNOSIS — Z955 Presence of coronary angioplasty implant and graft: Secondary | ICD-10-CM | POA: Diagnosis not present

## 2017-04-16 DIAGNOSIS — F329 Major depressive disorder, single episode, unspecified: Secondary | ICD-10-CM | POA: Diagnosis not present

## 2017-04-16 DIAGNOSIS — I2510 Atherosclerotic heart disease of native coronary artery without angina pectoris: Secondary | ICD-10-CM | POA: Diagnosis not present

## 2017-04-16 DIAGNOSIS — I10 Essential (primary) hypertension: Secondary | ICD-10-CM | POA: Insufficient documentation

## 2017-04-16 DIAGNOSIS — Z954 Presence of other heart-valve replacement: Secondary | ICD-10-CM | POA: Insufficient documentation

## 2017-04-16 DIAGNOSIS — Z79899 Other long term (current) drug therapy: Secondary | ICD-10-CM | POA: Insufficient documentation

## 2017-04-16 DIAGNOSIS — Z7901 Long term (current) use of anticoagulants: Secondary | ICD-10-CM | POA: Insufficient documentation

## 2017-04-16 DIAGNOSIS — I481 Persistent atrial fibrillation: Secondary | ICD-10-CM | POA: Diagnosis not present

## 2017-04-16 DIAGNOSIS — Z859 Personal history of malignant neoplasm, unspecified: Secondary | ICD-10-CM | POA: Diagnosis not present

## 2017-04-16 DIAGNOSIS — H2511 Age-related nuclear cataract, right eye: Secondary | ICD-10-CM | POA: Insufficient documentation

## 2017-04-16 DIAGNOSIS — Z7982 Long term (current) use of aspirin: Secondary | ICD-10-CM | POA: Diagnosis not present

## 2017-04-16 DIAGNOSIS — Z8673 Personal history of transient ischemic attack (TIA), and cerebral infarction without residual deficits: Secondary | ICD-10-CM | POA: Insufficient documentation

## 2017-04-16 DIAGNOSIS — Z888 Allergy status to other drugs, medicaments and biological substances status: Secondary | ICD-10-CM | POA: Insufficient documentation

## 2017-04-16 DIAGNOSIS — E7800 Pure hypercholesterolemia, unspecified: Secondary | ICD-10-CM | POA: Diagnosis not present

## 2017-04-16 DIAGNOSIS — I081 Rheumatic disorders of both mitral and tricuspid valves: Secondary | ICD-10-CM | POA: Diagnosis not present

## 2017-04-16 DIAGNOSIS — R0602 Shortness of breath: Secondary | ICD-10-CM | POA: Insufficient documentation

## 2017-04-16 DIAGNOSIS — Z794 Long term (current) use of insulin: Secondary | ICD-10-CM | POA: Insufficient documentation

## 2017-04-16 DIAGNOSIS — M1990 Unspecified osteoarthritis, unspecified site: Secondary | ICD-10-CM | POA: Insufficient documentation

## 2017-04-16 DIAGNOSIS — M316 Other giant cell arteritis: Secondary | ICD-10-CM | POA: Diagnosis not present

## 2017-04-16 HISTORY — PX: CATARACT EXTRACTION W/PHACO: SHX586

## 2017-04-16 HISTORY — DX: Unspecified osteoarthritis, unspecified site: M19.90

## 2017-04-16 LAB — GLUCOSE, CAPILLARY: Glucose-Capillary: 136 mg/dL — ABNORMAL HIGH (ref 65–99)

## 2017-04-16 SURGERY — PHACOEMULSIFICATION, CATARACT, WITH IOL INSERTION
Anesthesia: Monitor Anesthesia Care | Site: Eye | Laterality: Right | Wound class: Clean

## 2017-04-16 MED ORDER — NEOMYCIN-POLYMYXIN-DEXAMETH 0.1 % OP OINT
TOPICAL_OINTMENT | OPHTHALMIC | Status: DC | PRN
  Administered 2017-04-16: 1 via OPHTHALMIC

## 2017-04-16 MED ORDER — LIDOCAINE HCL (PF) 4 % IJ SOLN
INTRAOCULAR | Status: DC | PRN
  Administered 2017-04-16: 2 mL via OPHTHALMIC

## 2017-04-16 MED ORDER — CEFUROXIME OPHTHALMIC INJECTION 1 MG/0.1 ML
INJECTION | OPHTHALMIC | Status: DC | PRN
  Administered 2017-04-16: 1 mg via INTRACAMERAL

## 2017-04-16 MED ORDER — SODIUM CHLORIDE 0.9 % IV SOLN
INTRAVENOUS | Status: DC
  Administered 2017-04-16 (×2): via INTRAVENOUS

## 2017-04-16 MED ORDER — POVIDONE-IODINE 5 % OP SOLN
OPHTHALMIC | Status: AC
  Filled 2017-04-16: qty 30

## 2017-04-16 MED ORDER — ARMC OPHTHALMIC DILATING DROPS
OPHTHALMIC | Status: AC
  Administered 2017-04-16: 1 via OPHTHALMIC
  Filled 2017-04-16: qty 0.4

## 2017-04-16 MED ORDER — POVIDONE-IODINE 5 % OP SOLN
OPHTHALMIC | Status: DC | PRN
  Administered 2017-04-16: 1 via OPHTHALMIC

## 2017-04-16 MED ORDER — NA HYALUR & NA CHOND-NA HYALUR 0.4-0.35 ML IO KIT
PACK | INTRAOCULAR | Status: DC | PRN
  Administered 2017-04-16: 1 mL via INTRAOCULAR

## 2017-04-16 MED ORDER — EPINEPHRINE PF 1 MG/ML IJ SOLN
INTRAOCULAR | Status: DC | PRN
  Administered 2017-04-16: 1 mL via OPHTHALMIC

## 2017-04-16 MED ORDER — ARMC OPHTHALMIC DILATING DROPS
1.0000 "application " | OPHTHALMIC | Status: AC
  Administered 2017-04-16 (×3): 1 via OPHTHALMIC

## 2017-04-16 MED ORDER — CARBACHOL 0.01 % IO SOLN
INTRAOCULAR | Status: DC | PRN
  Administered 2017-04-16: .5 mL via INTRAOCULAR

## 2017-04-16 MED ORDER — FENTANYL CITRATE (PF) 100 MCG/2ML IJ SOLN
INTRAMUSCULAR | Status: AC
  Filled 2017-04-16: qty 2

## 2017-04-16 MED ORDER — LIDOCAINE HCL (PF) 4 % IJ SOLN
INTRAMUSCULAR | Status: AC
  Filled 2017-04-16: qty 5

## 2017-04-16 MED ORDER — NEOMYCIN-POLYMYXIN-DEXAMETH 3.5-10000-0.1 OP OINT
TOPICAL_OINTMENT | OPHTHALMIC | Status: AC
  Filled 2017-04-16: qty 3.5

## 2017-04-16 MED ORDER — NA HYALUR & NA CHOND-NA HYALUR 0.55-0.5 ML IO KIT
PACK | INTRAOCULAR | Status: AC
  Filled 2017-04-16: qty 1.05

## 2017-04-16 MED ORDER — POLYMYXIN B-TRIMETHOPRIM 10000-0.1 UNIT/ML-% OP SOLN
OPHTHALMIC | Status: AC
  Administered 2017-04-16: 1 [drp] via OPHTHALMIC
  Filled 2017-04-16: qty 20

## 2017-04-16 MED ORDER — MOXIFLOXACIN HCL 0.5 % OP SOLN: OPHTHALMIC | Status: DC | PRN

## 2017-04-16 MED ORDER — POLYMYXIN B-TRIMETHOPRIM 10000-0.1 UNIT/ML-% OP SOLN
1.0000 [drp] | OPHTHALMIC | Status: AC
  Administered 2017-04-16 (×3): 1 [drp] via OPHTHALMIC

## 2017-04-16 MED ORDER — EPINEPHRINE PF 1 MG/ML IJ SOLN
INTRAMUSCULAR | Status: AC
  Filled 2017-04-16: qty 1

## 2017-04-16 MED ORDER — FENTANYL CITRATE (PF) 100 MCG/2ML IJ SOLN
INTRAMUSCULAR | Status: DC | PRN
  Administered 2017-04-16: 25 ug via INTRAVENOUS

## 2017-04-16 SURGICAL SUPPLY — 17 items
DRAIN EYE SPONGE 80CC SGL (MISCELLANEOUS) ×2 IMPLANT
GLOVE BIO SURGEON STRL SZ8 (GLOVE) ×3 IMPLANT
GLOVE BIOGEL M 6.5 STRL (GLOVE) ×3 IMPLANT
GLOVE SURG LX 7.5 STRW (GLOVE) ×2
GLOVE SURG LX STRL 7.5 STRW (GLOVE) ×1 IMPLANT
GOWN STRL REUS W/ TWL LRG LVL3 (GOWN DISPOSABLE) ×2 IMPLANT
GOWN STRL REUS W/TWL LRG LVL3 (GOWN DISPOSABLE) ×6
LABEL CATARACT MEDS ST (LABEL) ×3 IMPLANT
LENS IOL TECNIS ITEC 21.5 (Intraocular Lens) ×2 IMPLANT
PACK CATARACT (MISCELLANEOUS) ×3 IMPLANT
PACK CATARACT BRASINGTON LX (MISCELLANEOUS) ×3 IMPLANT
PACK EYE AFTER SURG (MISCELLANEOUS) ×3 IMPLANT
SOL BSS BAG (MISCELLANEOUS) ×3
SOLUTION BSS BAG (MISCELLANEOUS) ×1 IMPLANT
SYR 5ML LL (SYRINGE) ×3 IMPLANT
WATER STERILE IRR 250ML POUR (IV SOLUTION) ×3 IMPLANT
WIPE NON LINTING 3.25X3.25 (MISCELLANEOUS) ×3 IMPLANT

## 2017-04-16 NOTE — Discharge Instructions (Signed)
Follow Dr. Larrie Kass postop discharge instruction sheet as reviewed.  Eye Surgery Discharge Instructions  Expect mild scratchy sensation or mild soreness. DO NOT RUB YOUR EYE!  The day of surgery:  Minimal physical activity, but bed rest is not required  No reading, computer work, or close hand work  No bending, lifting, or straining.  May watch TV  For 24 hours:  No driving, legal decisions, or alcoholic beverages  Safety precautions  Eat anything you prefer: It is better to start with liquids, then soup then solid foods.  _____ Eye patch should be worn until postoperative exam tomorrow.  ____ Solar shield eyeglasses should be worn for comfort in the sunlight/patch while sleeping  Resume all regular medications including aspirin or Coumadin if these were discontinued prior to surgery. You may shower, bathe, shave, or wash your hair. Tylenol may be taken for mild discomfort.  Call your doctor if you experience significant pain, nausea, or vomiting, fever > 101 or other signs of infection. [phone] or [phone] Specific instructions:  Follow-up Information    Leandrew Koyanagi, MD Follow up.   Specialty:  Ophthalmology Why:  04/17/17 @ 10:40 AM Contact information: [address]   [place] [address] [phone]

## 2017-04-16 NOTE — Anesthesia Preprocedure Evaluation (Signed)
Anesthesia Evaluation  Patient identified by MRN, date of birth, ID band Patient awake    Reviewed: Allergy & Precautions, NPO status , Patient's Chart, lab work & pertinent test results  History of Anesthesia Complications (+) PONV and history of anesthetic complications  Airway Mallampati: II  TM Distance: >3 FB Neck ROM: Full    Dental no notable dental hx.    Pulmonary neg pulmonary ROS, shortness of breath and with exertion, neg sleep apnea, neg COPD,    breath sounds clear to auscultation- rhonchi (-) wheezing      Cardiovascular hypertension, Pt. on medications + CAD (nonocclusive)  (-) Past MI, (-) Cardiac Stents and (-) CABG + dysrhythmias Atrial Fibrillation + Valvular Problems/Murmurs (s/p TAVR)  Rhythm:Regular Rate:Normal - Systolic murmurs and - Diastolic murmurs Echo 2/70/78:  Left ventricular hypertrophy - mild  Normal left ventricular systolic function, ejection fraction 55 to 60%  Degenerative mitral valve disease  Mitral annular calcification  Mitral regurgitation - mild to moderate  Dilated left atrium - moderate to severe  Dilated ascending aorta  Transcatheter aortic valve replacement (23 mm Sapien S3 TAVR prosthesis  implanted 08/27/2016)  Normal right ventricular systolic function  Tricuspid regurgitation - mild to moderate  Dilated right atrium - moderate  Mildly elevated right atrial pressure   Neuro/Psych PSYCHIATRIC DISORDERS Depression TIACVA, No Residual Symptoms    GI/Hepatic negative GI ROS, Neg liver ROS,   Endo/Other  diabetes, Insulin Dependent  Renal/GU negative Renal ROS     Musculoskeletal  (+) Arthritis ,   Abdominal (+) - obese,   Peds  Hematology negative hematology ROS (+)   Anesthesia Other Findings Past Medical History: No date: Arthritis No date: Atrial fibrillation (HCC)     Comment:  persistent, coumadin per PCP;  echo 06/06/09: EF 60%,   mild LVH, Gr 1 diast dysfx, AV gradient mean 9, mod MR,               mod LAE;  Myoview 11/09: normal, EF 81% No date: Back pain No date: Cancer (HCC) No date: Complication of anesthesia No date: Coronary artery disease No date: CVA (cerebral vascular accident) ([place])     Comment:  2006 No date: Depression No date: Disc disorder     Comment:  STEROID INJECTION 12/09/16 No date: Dyspnea     Comment:  DOE No date: Dysrhythmia No date: Fatigue No date: Giant cell arteritis ([place])     Comment:  sees dr. at [employer] No date: H/O: hysterectomy No date: Heart murmur No date: HTN (hypertension) No date: Hypercholesteremia     Comment:  Secondary to vascular disease No date: PONV (postoperative nausea and vomiting)     Comment:  DID FINE WITH CATARACT SURGERY No date: Subclavian artery stenosis, right ([place])     Comment:  dopplers done 2/12;  carotids 2/12: 0-39% bilateral No date: TIA (transient ischemic attack)   Reproductive/Obstetrics                             Anesthesia Physical Anesthesia Plan  ASA: III  Anesthesia Plan: MAC   Post-op Pain Management:    Induction: Intravenous  PONV Risk Score and Plan: 3 and Midazolam  Airway Management Planned: Natural Airway  Additional Equipment:   Intra-op Plan:   Post-operative Plan:   Informed Consent: I have reviewed the patients History and Physical, chart, labs and discussed the procedure including the risks, benefits and alternatives for the proposed anesthesia  with the patient or authorized representative who has indicated his/her understanding and acceptance.     Plan Discussed with: CRNA and Anesthesiologist  Anesthesia Plan Comments:         Anesthesia Quick Evaluation

## 2017-04-16 NOTE — Anesthesia Post-op Follow-up Note (Signed)
Anesthesia QCDR form completed.        

## 2017-04-16 NOTE — Anesthesia Postprocedure Evaluation (Signed)
Anesthesia Post Note  Patient: [name] [name]  Procedure(s) Performed: CATARACT EXTRACTION PHACO AND INTRAOCULAR LENS PLACEMENT (IOC) (Right Eye)  Patient location during evaluation: PACU Anesthesia Type: MAC Level of consciousness: awake and alert Pain management: pain level controlled Vital Signs Assessment: post-procedure vital signs reviewed and stable Respiratory status: spontaneous breathing, nonlabored ventilation and respiratory function stable Cardiovascular status: stable and blood pressure returned to baseline Postop Assessment: no apparent nausea or vomiting Anesthetic complications: no     Last Vitals:  Vitals:   04/16/17 1124 04/16/17 1125  BP: (!) 144/99 (!) 144/99  Pulse:  81  Resp:  16  Temp: 36.7 C 36.7 C  SpO2: 99% 99%    Last Pain:  Vitals:   04/16/17 1125  TempSrc: Oral                 Silvana Newness A

## 2017-04-16 NOTE — Op Note (Signed)
OPERATIVE NOTE  [name] [name] [id number] 04/16/2017   PREOPERATIVE DIAGNOSIS:  Nuclear Sclerotic Cataract Right Eye H25.11   POSTOPERATIVE DIAGNOSIS: Nuclear Sclerotic Cataract Right Eye H25.11          PROCEDURE:  Phacoemusification with posterior chamber intraocular lens placement of the right eye   LENS:   Implant Name Type Inv. Item Serial No. Manufacturer Lot No. LRB No. Used  LENS IOL DIOP 21.5 - [id number] Intraocular Lens LENS IOL DIOP 21.5 [id number]  Right 1       ULTRASOUND TIME: 20 %  of 1 minutes 13 seconds, CDE 15.0  SURGEON:  Wyonia Hough, MD   ANESTHESIA:  Topical with tetracaine drops and 2% Xylocaine jelly, augmented with 1% preservative-free intracameral lidocaine.    COMPLICATIONS:  None.   DESCRIPTION OF PROCEDURE:  The patient was identified in the holding room and transported to the operating room and placed in the supine position under the operating microscope. Theright eye was identified as the operative eye and it was prepped and draped in the usual sterile ophthalmic fashion.   A 1 millimeter clear-corneal paracentesis was made at the 12:00 position.  0.5 ml of preservative-free 1% lidocaine was injected into the anterior chamber. The anterior chamber was filled with Viscoat viscoelastic.  A 2.4 millimeter keratome was used to make a near-clear corneal incision at the 9:00 position. A curvilinear capsulorrhexis was made with a cystotome and capsulorrhexis forceps.  Balanced salt solution was used to hydrodissect and hydrodelineate the nucleus.   Phacoemulsification was then used in stop and chop fashion to remove the lens nucleus and epinucleus.  The remaining cortex was then removed using the irrigation and aspiration handpiece. Provisc was then placed into the capsular bag to distend it for lens placement.  A lens was then injected into the capsular bag.  The remaining viscoelastic was aspirated.  Wounds were hydrated with balanced salt  solution.  The anterior chamber was inflated to a physiologic pressure with balanced salt solution. Cefuroxime 0.1 ml of a 10mg /ml solution was injected into the anterior chamber for a dose of 1 mg of intracameral antibiotic at the completion of the case. Miostat was placed into the anterior chamber to constrict the pupil.  No wound leaks were noted.  Topical Vigamox drops and Maxitrol ointment were applied to the eye.  The patient was taken to the recovery room in stable condition without complications of anesthesia or surgery.  [name] 04/16/2017, 11:20 AM

## 2017-04-16 NOTE — Transfer of Care (Signed)
Immediate Anesthesia Transfer of Care Note  Patient: [name] [name]  Procedure(s) Performed: CATARACT EXTRACTION PHACO AND INTRAOCULAR LENS PLACEMENT (IOC) (Right Eye)  Patient Location: PACU  Anesthesia Type:MAC  Level of Consciousness: awake, alert , oriented and patient cooperative  Airway & Oxygen Therapy: Patient Spontanous Breathing  Post-op Assessment: Report given to RN, Post -op Vital signs reviewed and stable and Patient moving all extremities X 4  Post vital signs: Reviewed and stable  Last Vitals:  Vitals:   04/16/17 0940 04/16/17 1124  BP: (!) 147/85 (!) 144/99  Pulse: 90   Resp: 17   Temp: (!) 36.1 C 36.7 C  SpO2: 99% 99%    Last Pain:  Vitals:   04/16/17 1124  TempSrc: Oral         Complications: No apparent anesthesia complications

## 2017-04-16 NOTE — H&P (Signed)
The History and Physical notes are on paper, have been signed, and are to be scanned. The patient remains stable and unchanged from the H&P.   Previous H&P reviewed, patient examined, and there are no changes.  [name] [name] 04/16/2017 10:04 AM

## 2017-05-20 ENCOUNTER — Ambulatory Visit (HOSPITAL_COMMUNITY)
Admission: RE | Admit: 2017-05-20 | Discharge: 2017-05-20 | Disposition: A | Discharge status: Home / Self Care | Payer: Medicare HMO | Source: Ambulatory Visit | Attending: Nurse Practitioner | Admitting: Nurse Practitioner

## 2017-05-20 ENCOUNTER — Encounter (HOSPITAL_COMMUNITY): Payer: Self-pay | Admitting: Nurse Practitioner

## 2017-05-20 VITALS — BP 124/84 | HR 86 | Ht 62.0 in | Wt 122.0 lb

## 2017-05-20 DIAGNOSIS — Z79899 Other long term (current) drug therapy: Secondary | ICD-10-CM | POA: Diagnosis not present

## 2017-05-20 DIAGNOSIS — Z952 Presence of prosthetic heart valve: Secondary | ICD-10-CM | POA: Insufficient documentation

## 2017-05-20 DIAGNOSIS — Z7982 Long term (current) use of aspirin: Secondary | ICD-10-CM | POA: Diagnosis not present

## 2017-05-20 DIAGNOSIS — Z8249 Family history of ischemic heart disease and other diseases of the circulatory system: Secondary | ICD-10-CM | POA: Diagnosis not present

## 2017-05-20 DIAGNOSIS — I481 Persistent atrial fibrillation: Secondary | ICD-10-CM | POA: Insufficient documentation

## 2017-05-20 DIAGNOSIS — Z8673 Personal history of transient ischemic attack (TIA), and cerebral infarction without residual deficits: Secondary | ICD-10-CM | POA: Insufficient documentation

## 2017-05-20 DIAGNOSIS — I10 Essential (primary) hypertension: Secondary | ICD-10-CM | POA: Insufficient documentation

## 2017-05-20 DIAGNOSIS — I2510 Atherosclerotic heart disease of native coronary artery without angina pectoris: Secondary | ICD-10-CM | POA: Insufficient documentation

## 2017-05-20 DIAGNOSIS — I350 Nonrheumatic aortic (valve) stenosis: Secondary | ICD-10-CM | POA: Insufficient documentation

## 2017-05-20 DIAGNOSIS — Z7901 Long term (current) use of anticoagulants: Secondary | ICD-10-CM | POA: Diagnosis not present

## 2017-05-20 DIAGNOSIS — Z881 Allergy status to other antibiotic agents status: Secondary | ICD-10-CM | POA: Diagnosis not present

## 2017-05-20 DIAGNOSIS — E7800 Pure hypercholesterolemia, unspecified: Secondary | ICD-10-CM | POA: Insufficient documentation

## 2017-05-20 DIAGNOSIS — I482 Chronic atrial fibrillation, unspecified: Secondary | ICD-10-CM

## 2017-05-20 DIAGNOSIS — I4892 Unspecified atrial flutter: Secondary | ICD-10-CM | POA: Insufficient documentation
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
## 2017-08-26 ENCOUNTER — Other Ambulatory Visit (HOSPITAL_COMMUNITY): Payer: Self-pay | Admitting: Nurse Practitioner

## 2019-05-24 ENCOUNTER — Inpatient Hospital Stay (HOSPITAL_COMMUNITY)
Admission: EM | Admit: 2019-05-24 | Discharge: 2019-06-03 | DRG: 958 | Disposition: A | Discharge status: Skilled Nursing Facility | Payer: Medicare HMO | Attending: Surgery | Admitting: Surgery

## 2019-05-24 ENCOUNTER — Emergency Department (HOSPITAL_COMMUNITY): Payer: Medicare HMO

## 2019-05-24 ENCOUNTER — Other Ambulatory Visit: Payer: Self-pay

## 2019-05-24 ENCOUNTER — Encounter (HOSPITAL_COMMUNITY): Payer: Self-pay | Admitting: Emergency Medicine

## 2019-05-24 DIAGNOSIS — Z1612 Extended spectrum beta lactamase (ESBL) resistance: Secondary | ICD-10-CM | POA: Diagnosis not present

## 2019-05-24 DIAGNOSIS — M25532 Pain in left wrist: Secondary | ICD-10-CM

## 2019-05-24 DIAGNOSIS — L8990 Pressure ulcer of unspecified site, unspecified stage: Secondary | ICD-10-CM | POA: Insufficient documentation

## 2019-05-24 DIAGNOSIS — T1490XA Injury, unspecified, initial encounter: Secondary | ICD-10-CM | POA: Diagnosis not present

## 2019-05-24 DIAGNOSIS — W010XXA Fall on same level from slipping, tripping and stumbling without subsequent striking against object, initial encounter: Secondary | ICD-10-CM | POA: Diagnosis present

## 2019-05-24 DIAGNOSIS — S52502A Unspecified fracture of the lower end of left radius, initial encounter for closed fracture: Secondary | ICD-10-CM | POA: Diagnosis not present

## 2019-05-24 DIAGNOSIS — R296 Repeated falls: Secondary | ICD-10-CM | POA: Diagnosis present

## 2019-05-24 DIAGNOSIS — E785 Hyperlipidemia, unspecified: Secondary | ICD-10-CM | POA: Diagnosis present

## 2019-05-24 DIAGNOSIS — I2510 Atherosclerotic heart disease of native coronary artery without angina pectoris: Secondary | ICD-10-CM | POA: Diagnosis present

## 2019-05-24 DIAGNOSIS — Z8249 Family history of ischemic heart disease and other diseases of the circulatory system: Secondary | ICD-10-CM

## 2019-05-24 DIAGNOSIS — Z7901 Long term (current) use of anticoagulants: Secondary | ICD-10-CM

## 2019-05-24 DIAGNOSIS — M1990 Unspecified osteoarthritis, unspecified site: Secondary | ICD-10-CM | POA: Diagnosis present

## 2019-05-24 DIAGNOSIS — E440 Moderate protein-calorie malnutrition: Secondary | ICD-10-CM | POA: Diagnosis present

## 2019-05-24 DIAGNOSIS — Z681 Body mass index (BMI) 19 or less, adult: Secondary | ICD-10-CM

## 2019-05-24 DIAGNOSIS — M4850XA Collapsed vertebra, not elsewhere classified, site unspecified, initial encounter for fracture: Secondary | ICD-10-CM | POA: Diagnosis present

## 2019-05-24 DIAGNOSIS — I4819 Other persistent atrial fibrillation: Secondary | ICD-10-CM | POA: Diagnosis present

## 2019-05-24 DIAGNOSIS — E871 Hypo-osmolality and hyponatremia: Secondary | ICD-10-CM | POA: Diagnosis not present

## 2019-05-24 DIAGNOSIS — N390 Urinary tract infection, site not specified: Secondary | ICD-10-CM | POA: Diagnosis not present

## 2019-05-24 DIAGNOSIS — Z952 Presence of prosthetic heart valve: Secondary | ICD-10-CM

## 2019-05-24 DIAGNOSIS — I447 Left bundle-branch block, unspecified: Secondary | ICD-10-CM | POA: Diagnosis present

## 2019-05-24 DIAGNOSIS — S72011A Unspecified intracapsular fracture of right femur, initial encounter for closed fracture: Secondary | ICD-10-CM | POA: Diagnosis present

## 2019-05-24 DIAGNOSIS — Z20822 Contact with and (suspected) exposure to covid-19: Secondary | ICD-10-CM | POA: Diagnosis present

## 2019-05-24 DIAGNOSIS — M25539 Pain in unspecified wrist: Secondary | ICD-10-CM

## 2019-05-24 DIAGNOSIS — R001 Bradycardia, unspecified: Secondary | ICD-10-CM | POA: Diagnosis not present

## 2019-05-24 DIAGNOSIS — Z7982 Long term (current) use of aspirin: Secondary | ICD-10-CM

## 2019-05-24 DIAGNOSIS — I10 Essential (primary) hypertension: Secondary | ICD-10-CM | POA: Diagnosis present

## 2019-05-24 DIAGNOSIS — S32049A Unspecified fracture of fourth lumbar vertebra, initial encounter for closed fracture: Secondary | ICD-10-CM | POA: Diagnosis present

## 2019-05-24 DIAGNOSIS — E7800 Pure hypercholesterolemia, unspecified: Secondary | ICD-10-CM | POA: Diagnosis present

## 2019-05-24 DIAGNOSIS — E1151 Type 2 diabetes mellitus with diabetic peripheral angiopathy without gangrene: Secondary | ICD-10-CM | POA: Diagnosis present

## 2019-05-24 DIAGNOSIS — Z888 Allergy status to other drugs, medicaments and biological substances status: Secondary | ICD-10-CM

## 2019-05-24 DIAGNOSIS — S2220XA Unspecified fracture of sternum, initial encounter for closed fracture: Secondary | ICD-10-CM | POA: Diagnosis present

## 2019-05-24 DIAGNOSIS — S32591A Other specified fracture of right pubis, initial encounter for closed fracture: Secondary | ICD-10-CM | POA: Diagnosis present

## 2019-05-24 DIAGNOSIS — Z8673 Personal history of transient ischemic attack (TIA), and cerebral infarction without residual deficits: Secondary | ICD-10-CM

## 2019-05-24 DIAGNOSIS — Z79899 Other long term (current) drug therapy: Secondary | ICD-10-CM

## 2019-05-24 DIAGNOSIS — Z881 Allergy status to other antibiotic agents status: Secondary | ICD-10-CM

## 2019-05-24 DIAGNOSIS — S22069A Unspecified fracture of T7-T8 vertebra, initial encounter for closed fracture: Secondary | ICD-10-CM | POA: Diagnosis present

## 2019-05-24 LAB — CBC
HCT: 44.9 % (ref 36.0–46.0)
Hemoglobin: 13.9 g/dL (ref 12.0–15.0)
MCH: 27.6 pg (ref 26.0–34.0)
MCHC: 31 g/dL (ref 30.0–36.0)
MCV: 89.1 fL (ref 80.0–100.0)
Platelets: 316 10*3/uL (ref 150–400)
RBC: 5.04 MIL/uL (ref 3.87–5.11)
RDW: 15 % (ref 11.5–15.5)
WBC: 14 10*3/uL — ABNORMAL HIGH (ref 4.0–10.5)
nRBC: 0 % (ref 0.0–0.2)

## 2019-05-24 LAB — PROTIME-INR
INR: 2 — ABNORMAL HIGH (ref 0.8–1.2)
Prothrombin Time: 22.1 seconds — ABNORMAL HIGH (ref 11.4–15.2)

## 2019-05-24 MED ORDER — LIDOCAINE HCL 2 % IJ SOLN
10.0000 mL | Freq: Once | INTRAMUSCULAR | Status: DC
  Filled 2019-05-24: qty 20

## 2019-05-24 MED ORDER — MORPHINE SULFATE (PF) 4 MG/ML IV SOLN
4.0000 mg | Freq: Once | INTRAVENOUS | Status: AC
  Administered 2019-05-24: 23:00:00 4 mg via INTRAVENOUS
  Filled 2019-05-24: qty 1

## 2019-05-24 NOTE — Progress Notes (Signed)
Orthopedic Tech Progress Note Patient Details:  [name] [name] January 10, 1930 [id number]  Ortho Devices Type of Ortho Device: Ace wrap, Cotton web roll, Sugartong splint Ortho Device/Splint Location: RUE Ortho Device/Splint Interventions: Ordered, Application   Post Interventions Patient Tolerated: Well Instructions Provided: Care of device   Staci Righter 05/24/2019, 11:42 PM

## 2019-05-24 NOTE — ED Triage Notes (Addendum)
Patient from home, she tripped over her walker at home.  She was taken to [facility] in [place].  Xrays done there that show a fracture in her wrist.  Daughter here with her.  Patient did hit her head.  Patient did not have a LOC.  Patient is on coumadin.

## 2019-05-24 NOTE — ED Provider Notes (Addendum)
[facility] EMERGENCY DEPARTMENT Provider Note   CSN: [id number] Arrival date & time: 05/24/19  2121     History Chief Complaint  Patient presents with   Wrist Pain    [name] [name] is a 86 y.o. female.  HPI Patient is an 86 year old female with a PMH of hyperlipidemia, hypertension, atrial fibrillation on Coumadin presenting to the ED today following a fall.  Per patient, she tripped on a walker at her home and fell to the ground.  She endorses hitting her head but denies loss of consciousness.  Pt initially presented to [facility] where an XR showed a wrist fracture. On arrival, pt complaining of L wrist pain. She denies chest pain, abdominal pain, lightheadedness, nausea, vomiting.     Past Medical History:  Diagnosis Date   Arthritis    Atrial fibrillation ([place])    persistent, coumadin per PCP;  echo 06/06/09: EF 60%, mild LVH, Gr 1 diast dysfx, AV gradient mean 9, mod MR, mod LAE;  Myoview 11/09: normal, EF 81%   Back pain    Cancer (HCC)    Complication of anesthesia    Coronary artery disease    CVA (cerebral vascular accident) ([place])    2006   Depression    Disc disorder    STEROID INJECTION 12/09/16   Dyspnea    DOE   Dysrhythmia    Fatigue    Giant cell arteritis ([place])    sees dr. at [facility]   H/O: hysterectomy    Heart murmur    HTN (hypertension)    Hypercholesteremia    Secondary to vascular disease   PONV (postoperative nausea and vomiting)    DID FINE WITH CATARACT SURGERY   Subclavian artery stenosis, right ([place])    dopplers done 2/12;  carotids 2/12: 0-39% bilateral   TIA (transient ischemic attack)     Patient Active Problem List   Diagnosis Date Noted   Near syncope 06/17/2015   Pre-syncope 06/17/2015   Anticoagulant long-term use 08/27/2010   Chest pain, unspecified 06/29/2010   MURMUR 05/17/2009   VASCULITIS 11/23/2008   HYPERCHOLESTEROLEMIA 05/25/2008   Essential hypertension 05/25/2008    ATRIAL FIBRILLATION 05/25/2008   CVA 05/25/2008   TIA 05/25/2008   GIANT CELL ARTERITIS 05/25/2008   FATIGUE 05/25/2008   Palpitations 05/25/2008   DYSPNEA 05/25/2008    Past Surgical History:  Procedure Laterality Date   ABDOMINAL HYSTERECTOMY     APPENDECTOMY     CARDIAC VALVE REPLACEMENT     08/28/16   CARDIOVERSION N/A 08/18/2015   Procedure: CARDIOVERSION;  Surgeon: Dorothy Spark, MD;  Location: [place];  Service: Cardiovascular;  Laterality: N/A;   CATARACT EXTRACTION W/PHACO Left 03/10/2017   Procedure: CATARACT EXTRACTION PHACO AND INTRAOCULAR LENS PLACEMENT ([place]);  Surgeon: Leandrew Koyanagi, MD;  Location: [facility];  Service: Ophthalmology;  Laterality: Left;  US01:16.5 AP%21.1 [id number] FLUID LOT # [id number]   CATARACT EXTRACTION W/PHACO Right 04/16/2017   Procedure: CATARACT EXTRACTION PHACO AND INTRAOCULAR LENS PLACEMENT ([facility]);  Surgeon: Leandrew Koyanagi, MD;  Location: [facility];  Service: Ophthalmology;  Laterality: Right;  [place] 01:13 AP% 20.3 CDE 14.93 Fluid pack lot # [id number]   CORONARY ANGIOPLASTY     STENT 08/21/16   KNEE SURGERY  3/06   Left   MOHS SURGERY     PROLAPSED UTERINE FIBROID LIGATION  7/05     OB History   No obstetric history on file.     Family History  Problem Relation Age of  Onset   Hypertension Daughter    Hypertension Other    Stroke Neg Hx    Diabetes type II Neg Hx     Social History   Tobacco Use   Smoking status: Never Smoker   Smokeless tobacco: Never Used  Substance Use Topics   Alcohol use: No   Drug use: No    Home Medications Prior to Admission medications   Medication Sig Start Date End Date Taking? Authorizing Provider  acetaminophen (TYLENOL) 325 MG tablet Take 650 mg by mouth every 6 (six) hours as needed for moderate pain or headache.    [provider]  amiodarone (PACERONE) 200 MG tablet TAKE 1/2 TABLET BY MOUTH DAILY 08/26/17   Sherran Needs, NP  amLODipine  (NORVASC) 5 MG tablet Take 5 mg by mouth daily.    [provider]  aspirin 81 MG tablet Take 81 mg by mouth daily.    [provider]  atorvastatin (LIPITOR) 10 MG tablet Take 10 mg by mouth at bedtime.    [provider]  Difluprednate (DUREZOL) 0.05 % EMUL Place 1 drop into the left eye daily.    [provider]  fluticasone (FLONASE) 50 MCG/ACT nasal spray Place 1 spray into both nostrils daily as needed for allergies or rhinitis.    [provider]  HYDROcodone-acetaminophen (NORCO/VICODIN) 5-325 MG tablet Take 1 tablet by mouth every 6 (six) hours as needed for moderate pain.    [provider]  metoprolol succinate (TOPROL-XL) 100 MG 24 hr tablet Take 1/2 tablet (40m) by mouth twice a day Patient taking differently: Take 50 mg by mouth 2 (two) times daily.  08/06/16   CSherran Needs NP  mirtazapine (REMERON) 15 MG tablet Take 7.5 mg by mouth at bedtime.    [provider]  nitroGLYCERIN (NITROSTAT) 0.4 MG SL tablet Place 1 tablet (0.4 mg total) under the tongue every 5 (five) minutes as needed for chest pain. Patient not taking: Reported on 05/20/2017 11/01/10 04/03/17  NJosue Hector MD  TRESIBA FLEXTOUCH 100 UNIT/ML SOPN FlexTouch Pen Inject 10 Units into the skin at bedtime.  03/18/17   [provider]  warfarin (COUMADIN) 5 MG tablet Take 2.5-5 mg by mouth See admin instructions. Take 2.5 mg by mouth daily Sunday, Monday, Wednesday and Friday. Take 5 mg by mouth once daily on all other days    [provider]    Allergies    Ciprofloxacin, Escitalopram, and Ranitidine  Review of Systems   Review of Systems  Constitutional: Negative for chills and fever.  HENT: Negative for rhinorrhea and sore throat.   Eyes: Negative for photophobia and visual disturbance.  Respiratory: Negative for cough and shortness of breath.   Cardiovascular: Negative for chest pain and palpitations.  Gastrointestinal: Negative  for abdominal pain, diarrhea, nausea and vomiting.  Genitourinary: Negative for decreased urine volume and dysuria.  Musculoskeletal: Positive for arthralgias and back pain (chronic). Negative for gait problem, neck pain and neck stiffness.  Skin: Negative for rash and wound.  Neurological: Negative for seizures, syncope and headaches.  Psychiatric/Behavioral: Negative for agitation.  All other systems reviewed and are negative.   Physical Exam Updated Vital Signs BP (!) 90/42 (BP Location: Right Arm)    Pulse 89    Temp 97.9 F (36.6 C) (Oral)    SpO2 97%   Physical Exam Vitals and nursing note reviewed.  Constitutional:      General: She is not in acute distress.  Appearance: Normal appearance. She is well-developed and normal weight. She is not ill-appearing.  HENT:     Head: Normocephalic and atraumatic.     Right Ear: External ear normal.     Left Ear: External ear normal.     Nose: Nose normal. No congestion or rhinorrhea.     Mouth/Throat:     Mouth: Mucous membranes are moist.     Pharynx: Oropharynx is clear.  Eyes:     Extraocular Movements: Extraocular movements intact.     Pupils: Pupils are equal, round, and reactive to light.  Cardiovascular:     Rate and Rhythm: Normal rate and regular rhythm.     Pulses: Normal pulses.     Heart sounds: Normal heart sounds.  Pulmonary:     Effort: Pulmonary effort is normal. No respiratory distress.     Breath sounds: Normal breath sounds. No stridor. No wheezing, rhonchi or rales.  Abdominal:     General: There is no distension.     Palpations: Abdomen is soft.     Tenderness: There is no abdominal tenderness. There is no guarding or rebound.  Musculoskeletal:        General: Normal range of motion.     Cervical back: Normal range of motion and neck supple. No rigidity or tenderness.     Right lower leg: No edema.     Left lower leg: No edema.     Comments: Obvious deformity to L wrist. Radial pulse intact. Full  strength and ROM.  Skin:    General: Skin is warm and dry.     Capillary Refill: Capillary refill takes less than 2 seconds.  Neurological:     General: No focal deficit present.     Mental Status: She is alert and oriented to person, place, and time. Mental status is at baseline.  Psychiatric:        Mood and Affect: Mood normal.     ED Results / Procedures / Treatments   Labs (all labs ordered are listed, but only abnormal results are displayed) Labs Reviewed  PROTIME-INR - Abnormal; Notable for the following components:      Result Value   Prothrombin Time 22.1 (*)    INR 2.0 (*)    All other components within normal limits  CBC - Abnormal; Notable for the following components:   WBC 14.0 (*)    All other components within normal limits  COMPREHENSIVE METABOLIC PANEL  TYPE AND SCREEN  ABO/RH    EKG None  Radiology DG Wrist Complete Left  Result Date: 05/24/2019 CLINICAL DATA:  Fall, left wrist deformity EXAM: LEFT WRIST - COMPLETE 3+ VIEW COMPARISON:  None. FINDINGS: Dorsally angulated, impacted and mildly comminuted fracture the left radial metaphysis with extension into the distal radioulnar joint and likely extension to the radiocarpal articular surface along the medial aspect. There is some dorsal rotation of the lunate relative to the capitate in an alignment suspicious for dorsal intercalated segmental instability (DISI) findings on a background moderate arthrosis in the wrist most pronounced at the first carpometacarpal and triscaphe joints. Circumferential soft tissue swelling is noted. Extensive vascular calcium in the soft tissues. IMPRESSION: Dorsally angulated, impacted, and mildly comminuted fracture of the left radial metaphysis with extension into the distal radioulnar joint and likely the radiocarpal articular surface. Irregular appearance of the capitolunate angle, suspicious for a dorsal instability. Circumferential swelling. The osseous structures appear  diffusely demineralized which may limit detection of small or nondisplaced fractures. Electronically Signed  By: Lovena Le M.D.   On: 05/24/2019 23:14   DG Pelvis Portable  Result Date: 05/24/2019 CLINICAL DATA:  Fall, level 2 trauma EXAM: PORTABLE PELVIS 1-2 VIEWS COMPARISON:  Radiograph Jul 29, 2018 FINDINGS: Cortical step-off at the right femoral head neck junction is suspicious for a varus angulated subcapital femoral neck fracture. There is a subacute fracture of the right inferior pubic ramus with additional irregularity at the right inferior pubic body which is also likely subacute in nature. Stable remote deformity of the left pubic root and inferior ramus. Severe degenerative changes in the left hip. More moderate degenerative change in the right hip. Multilevel degenerative changes are present in the imaged portions of the spine. Additional sclerotic features in the SI joints without diastatic widening of the SI or symphysis pubis. Extensive vascular calcium noted in the pelvis and medial thighs as well as within the abdominal aorta. Bowel gas pattern is unremarkable. IMPRESSION: 1. Probable varus angulated subcapital right femoral neck fracture. Femoral heads remain located. 2. Subacute fractures of the right inferior pubic ramus and right inferior pubic body. 3. Severe left and moderate right hip osteoarthritis. 4. Bones are diffusely demineralized which may limit detection of subtle or nondisplaced fractures. Electronically Signed   By: Lovena Le M.D.   On: 05/24/2019 23:10   DG Chest Port 1 View  Result Date: 05/24/2019 CLINICAL DATA:  Fall today, level 2 trauma EXAM: PORTABLE CHEST 1 VIEW COMPARISON:  Radiograph 03/31/2019, CT 06/17/2015 FINDINGS: Chronically coarsened interstitial changes in the lungs with some bandlike areas of scarring and/or atelectasis in the left mid lung and left lung base. Stable positioning of an aortic valve stent repair. Extensive mitral annular  calcifications are present. No consolidation, features of edema, pneumothorax, or effusion. Chronic deformity of the right proximal humerus. Degenerative changes are present in the imaged spine and shoulders. IMPRESSION: Bandlike areas of atelectasis or scarring in the left lung base. No acute cardiopulmonary abnormality. Chronic deformity of the proximal right humerus. Electronically Signed   By: Lovena Le M.D.   On: 05/24/2019 23:07    Procedures .Nerve Block  Date/Time: 05/25/2019 12:40 AM Performed by: Nadeen Landau, MD Authorized by: Nadeen Landau, MD   Consent:    Consent obtained:  Verbal   Consent given by:  Patient   Risks discussed:  Allergic reaction, bleeding, infection, intravenous injection, nerve damage, pain, swelling and unsuccessful block   Alternatives discussed:  No treatment Indications:    Indications:  Pain relief Location:    Body area:  Upper extremity   Laterality:  Left Pre-procedure details:    Skin preparation:  2% chlorhexidine Skin anesthesia (see MAR for exact dosages):    Skin anesthesia method:  None Procedure details (see MAR for exact dosages):    Block needle gauge:  22 G   Anesthetic injected:  Lidocaine 2% w/o epi   Steroid injected:  None   Additive injected:  None   Injection procedure:  Anatomic landmarks identified, anatomic landmarks palpated, incremental injection and introduced needle   Paresthesia:  None Post-procedure details:    Outcome:  Pain improved   Patient tolerance of procedure:  Tolerated well, no immediate complications Comments:     Hematoma block prior to fracture dislocation reduction. Reduction of fracture  Date/Time: 05/25/2019 12:42 AM Performed by: Nadeen Landau, MD Authorized by: Nadeen Landau, MD  Consent: Verbal consent obtained. Risks and benefits: risks, benefits and alternatives were discussed Consent given by: patient Patient understanding: patient states understanding of the  procedure being  performed Patient consent: the patient's understanding of the procedure matches consent given Procedure consent: procedure consent matches procedure scheduled Imaging studies: imaging studies available Local anesthesia used: yes Anesthesia: hematoma block  Anesthesia: Local anesthesia used: yes Anesthetic total: 5 mL  Sedation: Patient sedated: no  Patient tolerance: patient tolerated the procedure well with no immediate complications Comments: Reduction of distal radius fracture of left arm.    (including critical care time)  Medications Ordered in ED Medications - No data to display  ED Course  I have reviewed the triage vital signs and the nursing notes.  Pertinent labs & imaging results that were available during my care of the patient were reviewed by me and considered in my medical decision making (see chart for details).    MDM Rules/Calculators/A&P                     Patient is an 86 year old female with a PMH of hyperlipidemia, hypertension, atrial fibrillation on Coumadin presenting to the ED today following a fall.  On exam, patient has obvious deformity to left wrist.  BP 90/42, HR 89, RR 24, SPO2 97% on RA.  Afebrile.  On arrival, patient appears generally well and is displaying no signs of acute distress.  Patient's story consistent with mechanical fall and there is low concern for syncope.  She has tenderness palpation of the left wrist but denies abdominal tenderness or chest tenderness.  Patient says that she has a history of a hip fracture and that she may have "tweaked it" during the fall.  However, she has no tenderness palpation of either hip and has full range of motion of her hips without pain.  She has no other obvious external signs of injury.   Chest x-ray shows atelectasis of the left lung base.  Pelvic x-ray shows "Probable varus angulated subcapital right femoral neck fracture. Femoral heads remain located. Subacute fractures of the right inferior  pubic ramus and right inferior pubic body."  Wrist x-ray shows left radial metaphysis fracture.  Performed hematoma block and reduced wrist fracture as documented above.  Given concern for potential right hip fracture and initial soft blood pressures, will obtain CT chest abdomen and pelvis.  CBC with WBC 14 and stable hemoglobin.  INR 2.0.  CMP with CO2 19, BUN 30, AST 55, alk phos 151.  At this time, patient care is handed off to oncoming provider.  At handoff, plan is to follow-up CT chest abdomen pelvis.  If imaging is negative for acute fracture, patient likely to be discharged home with Ortho follow-up.  Patient stable at time of handoff.  Patient assessed and evaluated with Dr. Darl Householder.  Nadeen Landau, MD  Final Clinical Impression(s) / ED Diagnoses Final diagnoses:  Trauma    Rx / DC Orders ED Discharge Orders    None       Nadeen Landau, MD 05/25/19 [id number]    Nadeen Landau, MD 05/25/19 [id number]    Drenda Freeze, MD 05/26/19 1055

## 2019-05-24 NOTE — Progress Notes (Signed)
Orthopedic Tech Progress Note Patient Details:  [name] [name] Jul 11, 1929 [id number] TRAUMA LEVEL 2 FALL Patient ID: [name] [name], female   DOB: 1930/02/07, 86 y.o.   MRN: [id number]   Staci Righter 05/24/2019, 11:04 PM

## 2019-05-24 NOTE — Progress Notes (Signed)
Chaplain responded to ED for Level 2 Trauma in A.  The chaplain understands no needs at this time.  Spiritual care F/U is available if needed.

## 2019-05-25 ENCOUNTER — Emergency Department (HOSPITAL_COMMUNITY): Payer: Medicare HMO

## 2019-05-25 ENCOUNTER — Encounter (HOSPITAL_COMMUNITY): Payer: Self-pay

## 2019-05-25 DIAGNOSIS — E440 Moderate protein-calorie malnutrition: Secondary | ICD-10-CM | POA: Diagnosis present

## 2019-05-25 DIAGNOSIS — S32591A Other specified fracture of right pubis, initial encounter for closed fracture: Secondary | ICD-10-CM | POA: Diagnosis present

## 2019-05-25 DIAGNOSIS — Z01810 Encounter for preprocedural cardiovascular examination: Secondary | ICD-10-CM | POA: Diagnosis not present

## 2019-05-25 DIAGNOSIS — Z952 Presence of prosthetic heart valve: Secondary | ICD-10-CM | POA: Diagnosis not present

## 2019-05-25 DIAGNOSIS — I4821 Permanent atrial fibrillation: Secondary | ICD-10-CM | POA: Diagnosis not present

## 2019-05-25 DIAGNOSIS — N390 Urinary tract infection, site not specified: Secondary | ICD-10-CM | POA: Diagnosis not present

## 2019-05-25 DIAGNOSIS — E871 Hypo-osmolality and hyponatremia: Secondary | ICD-10-CM | POA: Diagnosis not present

## 2019-05-25 DIAGNOSIS — S22069A Unspecified fracture of T7-T8 vertebra, initial encounter for closed fracture: Secondary | ICD-10-CM | POA: Diagnosis present

## 2019-05-25 DIAGNOSIS — T1490XA Injury, unspecified, initial encounter: Secondary | ICD-10-CM | POA: Diagnosis present

## 2019-05-25 DIAGNOSIS — I10 Essential (primary) hypertension: Secondary | ICD-10-CM | POA: Diagnosis present

## 2019-05-25 DIAGNOSIS — Z7901 Long term (current) use of anticoagulants: Secondary | ICD-10-CM | POA: Diagnosis not present

## 2019-05-25 DIAGNOSIS — I351 Nonrheumatic aortic (valve) insufficiency: Secondary | ICD-10-CM | POA: Diagnosis not present

## 2019-05-25 DIAGNOSIS — Z7982 Long term (current) use of aspirin: Secondary | ICD-10-CM | POA: Diagnosis not present

## 2019-05-25 DIAGNOSIS — W19XXXA Unspecified fall, initial encounter: Secondary | ICD-10-CM | POA: Diagnosis not present

## 2019-05-25 DIAGNOSIS — Z1612 Extended spectrum beta lactamase (ESBL) resistance: Secondary | ICD-10-CM | POA: Diagnosis not present

## 2019-05-25 DIAGNOSIS — Z20822 Contact with and (suspected) exposure to covid-19: Secondary | ICD-10-CM | POA: Diagnosis present

## 2019-05-25 DIAGNOSIS — I2510 Atherosclerotic heart disease of native coronary artery without angina pectoris: Secondary | ICD-10-CM | POA: Diagnosis present

## 2019-05-25 DIAGNOSIS — S72011A Unspecified intracapsular fracture of right femur, initial encounter for closed fracture: Secondary | ICD-10-CM | POA: Diagnosis present

## 2019-05-25 DIAGNOSIS — S32049A Unspecified fracture of fourth lumbar vertebra, initial encounter for closed fracture: Secondary | ICD-10-CM | POA: Diagnosis present

## 2019-05-25 DIAGNOSIS — I498 Other specified cardiac arrhythmias: Secondary | ICD-10-CM | POA: Diagnosis not present

## 2019-05-25 DIAGNOSIS — I350 Nonrheumatic aortic (valve) stenosis: Secondary | ICD-10-CM | POA: Diagnosis not present

## 2019-05-25 DIAGNOSIS — E7800 Pure hypercholesterolemia, unspecified: Secondary | ICD-10-CM | POA: Diagnosis present

## 2019-05-25 DIAGNOSIS — M4850XA Collapsed vertebra, not elsewhere classified, site unspecified, initial encounter for fracture: Secondary | ICD-10-CM | POA: Diagnosis present

## 2019-05-25 DIAGNOSIS — Z681 Body mass index (BMI) 19 or less, adult: Secondary | ICD-10-CM | POA: Diagnosis not present

## 2019-05-25 DIAGNOSIS — I4819 Other persistent atrial fibrillation: Secondary | ICD-10-CM | POA: Diagnosis present

## 2019-05-25 DIAGNOSIS — W010XXA Fall on same level from slipping, tripping and stumbling without subsequent striking against object, initial encounter: Secondary | ICD-10-CM | POA: Diagnosis present

## 2019-05-25 DIAGNOSIS — Z8673 Personal history of transient ischemic attack (TIA), and cerebral infarction without residual deficits: Secondary | ICD-10-CM | POA: Diagnosis not present

## 2019-05-25 DIAGNOSIS — S52502A Unspecified fracture of the lower end of left radius, initial encounter for closed fracture: Secondary | ICD-10-CM | POA: Diagnosis present

## 2019-05-25 DIAGNOSIS — I447 Left bundle-branch block, unspecified: Secondary | ICD-10-CM | POA: Diagnosis present

## 2019-05-25 DIAGNOSIS — S2220XA Unspecified fracture of sternum, initial encounter for closed fracture: Secondary | ICD-10-CM | POA: Diagnosis present

## 2019-05-25 DIAGNOSIS — E785 Hyperlipidemia, unspecified: Secondary | ICD-10-CM | POA: Diagnosis present

## 2019-05-25 DIAGNOSIS — Z8249 Family history of ischemic heart disease and other diseases of the circulatory system: Secondary | ICD-10-CM | POA: Diagnosis not present

## 2019-05-25 DIAGNOSIS — M1990 Unspecified osteoarthritis, unspecified site: Secondary | ICD-10-CM | POA: Diagnosis present

## 2019-05-25 DIAGNOSIS — Z79899 Other long term (current) drug therapy: Secondary | ICD-10-CM | POA: Diagnosis not present

## 2019-05-25 LAB — COMPREHENSIVE METABOLIC PANEL
ALT: 43 U/L (ref 0–44)
AST: 55 U/L — ABNORMAL HIGH (ref 15–41)
Albumin: 4.1 g/dL (ref 3.5–5.0)
Alkaline Phosphatase: 151 U/L — ABNORMAL HIGH (ref 38–126)
Anion gap: 13 (ref 5–15)
BUN: 30 mg/dL — ABNORMAL HIGH (ref 8–23)
CO2: 19 mmol/L — ABNORMAL LOW (ref 22–32)
Calcium: 9.5 mg/dL (ref 8.9–10.3)
Chloride: 104 mmol/L (ref 98–111)
Creatinine, Ser: 0.83 mg/dL (ref 0.44–1.00)
GFR calc Af Amer: >60 mL/min (ref >60)
GFR calc non Af Amer: >60 mL/min (ref >60)
Glucose, Bld: 145 mg/dL — ABNORMAL HIGH (ref 70–99)
Potassium: 4.9 mmol/L (ref 3.5–5.1)
Sodium: 136 mmol/L (ref 135–145)
Total Bilirubin: 0.9 mg/dL (ref 0.3–1.2)
Total Protein: 7.3 g/dL (ref 6.5–8.1)

## 2019-05-25 LAB — TYPE AND SCREEN
ABO/RH(D): O POS
Antibody Screen: NEGATIVE

## 2019-05-25 LAB — TROPONIN I (HIGH SENSITIVITY): Troponin I (High Sensitivity): 31 ng/L — ABNORMAL HIGH (ref <18)

## 2019-05-25 LAB — URINALYSIS, ROUTINE W REFLEX MICROSCOPIC
Bilirubin Urine: NEGATIVE
Glucose, UA: NEGATIVE mg/dL
Hgb urine dipstick: NEGATIVE
Ketones, ur: NEGATIVE mg/dL
Leukocytes,Ua: NEGATIVE
Nitrite: NEGATIVE
Protein, ur: 100 mg/dL — AB
Specific Gravity, Urine: >1.046 — ABNORMAL HIGH (ref 1.005–1.030)
pH: 5 (ref 5.0–8.0)

## 2019-05-25 LAB — GLUCOSE, CAPILLARY: Glucose-Capillary: 260 mg/dL — ABNORMAL HIGH (ref 70–99)

## 2019-05-25 LAB — RESPIRATORY PANEL BY RT PCR (FLU A&B, COVID)
Influenza A by PCR: NEGATIVE
Influenza B by PCR: NEGATIVE
SARS Coronavirus 2 by RT PCR: NEGATIVE

## 2019-05-25 LAB — ABO/RH: ABO/RH(D): O POS

## 2019-05-25 LAB — CBG MONITORING, ED: Glucose-Capillary: 131 mg/dL — ABNORMAL HIGH (ref 70–99)

## 2019-05-25 MED ORDER — ONDANSETRON 4 MG PO TBDP: 4.0000 mg | ORAL_TABLET | Freq: Four times a day (QID) | ORAL | Status: DC | PRN

## 2019-05-25 MED ORDER — DULOXETINE HCL 30 MG PO CPEP
30.0000 mg | ORAL_CAPSULE | Freq: Every day | ORAL | Status: DC
  Administered 2019-05-25 – 2019-06-03 (×9): 30 mg via ORAL
  Filled 2019-05-25 (×10): qty 1

## 2019-05-25 MED ORDER — DOCUSATE SODIUM 100 MG PO CAPS
100.0000 mg | ORAL_CAPSULE | Freq: Two times a day (BID) | ORAL | Status: DC
  Administered 2019-05-25 – 2019-06-03 (×15): 100 mg via ORAL
  Filled 2019-05-25 (×17): qty 1

## 2019-05-25 MED ORDER — BISACODYL 10 MG RE SUPP
10.0000 mg | Freq: Every day | RECTAL | Status: DC | PRN
  Administered 2019-05-25: 21:00:00 10 mg via RECTAL
  Filled 2019-05-25: qty 1

## 2019-05-25 MED ORDER — WARFARIN - PHYSICIAN DOSING INPATIENT: Freq: Every day | Status: DC

## 2019-05-25 MED ORDER — TRAMADOL HCL 50 MG PO TABS
50.0000 mg | ORAL_TABLET | Freq: Four times a day (QID) | ORAL | Status: DC | PRN
  Administered 2019-05-25: 100 mg via ORAL
  Administered 2019-05-26: 21:00:00 50 mg via ORAL
  Administered 2019-05-26: 03:00:00 100 mg via ORAL
  Administered 2019-05-27 – 2019-06-02 (×3): 50 mg via ORAL
  Filled 2019-05-25 (×3): qty 1
  Filled 2019-05-25: qty 2
  Filled 2019-05-25: qty 1
  Filled 2019-05-25: qty 2

## 2019-05-25 MED ORDER — METHOCARBAMOL 500 MG PO TABS
1000.0000 mg | ORAL_TABLET | Freq: Three times a day (TID) | ORAL | Status: DC
  Administered 2019-05-25 – 2019-06-03 (×27): 1000 mg via ORAL
  Filled 2019-05-25 (×26): qty 2

## 2019-05-25 MED ORDER — ATORVASTATIN CALCIUM 10 MG PO TABS
10.0000 mg | ORAL_TABLET | ORAL | Status: DC
  Administered 2019-05-25 – 2019-06-02 (×5): 10 mg via ORAL
  Filled 2019-05-25 (×6): qty 1

## 2019-05-25 MED ORDER — INSULIN DEGLUDEC 100 UNIT/ML ~~LOC~~ SOPN
7.0000 [IU] | PEN_INJECTOR | Freq: Every day | SUBCUTANEOUS | Status: DC
  Filled 2019-05-25: qty 3

## 2019-05-25 MED ORDER — LISINOPRIL 5 MG PO TABS
5.0000 mg | ORAL_TABLET | Freq: Every day | ORAL | Status: DC
  Administered 2019-05-25 – 2019-06-03 (×9): 5 mg via ORAL
  Filled 2019-05-25 (×10): qty 1

## 2019-05-25 MED ORDER — MORPHINE SULFATE (PF) 2 MG/ML IV SOLN
2.0000 mg | INTRAVENOUS | Status: DC | PRN
  Administered 2019-05-25 (×2): 2 mg via INTRAVENOUS
  Filled 2019-05-25 (×2): qty 1

## 2019-05-25 MED ORDER — MIRTAZAPINE 15 MG PO TABS
7.5000 mg | ORAL_TABLET | Freq: Every day | ORAL | Status: DC
  Administered 2019-05-25 – 2019-06-02 (×9): 7.5 mg via ORAL
  Filled 2019-05-25 (×10): qty 1

## 2019-05-25 MED ORDER — VITAMIN D (ERGOCALCIFEROL) 1.25 MG (50000 UNIT) PO CAPS
50000.0000 [IU] | ORAL_CAPSULE | ORAL | Status: DC
  Administered 2019-05-27 – 2019-06-03 (×2): 50000 [IU] via ORAL
  Filled 2019-05-25 (×2): qty 1

## 2019-05-25 MED ORDER — POTASSIUM CHLORIDE CRYS ER 20 MEQ PO TBCR
20.0000 meq | EXTENDED_RELEASE_TABLET | Freq: Every day | ORAL | Status: DC
  Filled 2019-05-25: qty 1

## 2019-05-25 MED ORDER — WARFARIN SODIUM 2.5 MG PO TABS
2.5000 mg | ORAL_TABLET | Freq: Every day | ORAL | Status: DC
  Administered 2019-05-25 – 2019-05-26 (×2): 2.5 mg via ORAL
  Filled 2019-05-25 (×4): qty 1

## 2019-05-25 MED ORDER — ACETAMINOPHEN 500 MG PO TABS
1000.0000 mg | ORAL_TABLET | Freq: Four times a day (QID) | ORAL | Status: DC
  Administered 2019-05-25 – 2019-06-03 (×28): 1000 mg via ORAL
  Filled 2019-05-25 (×30): qty 2

## 2019-05-25 MED ORDER — AMIODARONE HCL 200 MG PO TABS
200.0000 mg | ORAL_TABLET | Freq: Every day | ORAL | Status: DC
  Administered 2019-05-25 – 2019-06-03 (×10): 200 mg via ORAL
  Filled 2019-05-25 (×10): qty 1

## 2019-05-25 MED ORDER — ONDANSETRON HCL 4 MG/2ML IJ SOLN
4.0000 mg | Freq: Four times a day (QID) | INTRAMUSCULAR | Status: DC | PRN
  Administered 2019-05-26 – 2019-06-02 (×3): 4 mg via INTRAVENOUS
  Filled 2019-05-25 (×3): qty 2

## 2019-05-25 MED ORDER — INSULIN ASPART 100 UNIT/ML ~~LOC~~ SOLN
0.0000 [IU] | Freq: Three times a day (TID) | SUBCUTANEOUS | Status: DC
  Administered 2019-05-25: 5 [IU] via SUBCUTANEOUS
  Administered 2019-05-26: 1 [IU] via SUBCUTANEOUS

## 2019-05-25 MED ORDER — BUDESONIDE 3 MG PO CPEP
6.0000 mg | ORAL_CAPSULE | Freq: Every day | ORAL | Status: DC
  Administered 2019-05-25 – 2019-06-03 (×9): 6 mg via ORAL
  Filled 2019-05-25 (×10): qty 2

## 2019-05-25 MED ORDER — IOHEXOL 300 MG/ML  SOLN
100.0000 mL | Freq: Once | INTRAMUSCULAR | Status: AC | PRN
  Administered 2019-05-25: 100 mL via INTRAVENOUS

## 2019-05-25 NOTE — ED Notes (Signed)
SDU  Breakfast ordered  

## 2019-05-25 NOTE — Progress Notes (Signed)
Notified MD of constipation and blood glucose of 260.

## 2019-05-25 NOTE — Progress Notes (Signed)
Orthopedic Tech Progress Note Patient Details:  [name] [name] 1930-01-01 [id number]  Patient ID: [name], female   DOB: March 01, 1930, 86 y.o.   MRN: [id number]   [name] 05/25/2019, 10:04 AMBRACE ORDER ROUTED TO HANGER.

## 2019-05-25 NOTE — H&P (Signed)
TRAUMA H&P  05/25/2019, 6:43 AM   Chief Complaint: ground level fall Consultant: Betsey Holiday, MD  The patient is an 86 y.o. female.   HPI: 85F s/p mechanical GLF around 1700 on 05/24/2019. Most significant complaint is "chest pain", that she localizes to the epigastrium and states it feels the same as pain that began approximately 2 weeks ago. Reports turning around in the kitchen while using her walker and tripping over one of the wheels. Denies LOC. History of 4-5 falls in the last six months. Lives alone, but has a caregiver that comes in the day and one at night. Son stays with her 2-3 nights per week.   Past Medical History:  Diagnosis Date  . Arthritis   . Atrial fibrillation (HCC)    persistent, coumadin per PCP;  echo 06/06/09: EF 60%, mild LVH, Gr 1 diast dysfx, AV gradient mean 9, mod MR, mod LAE;  Myoview 11/09: normal, EF 81%  . Back pain   . Cancer ([place])   . Complication of anesthesia   . Coronary artery disease   . CVA (cerebral vascular accident) ([place])    2006  . Depression   . Disc disorder    STEROID INJECTION 12/09/16  . Dyspnea    DOE  . Dysrhythmia   . Fatigue   . Giant cell arteritis [facility])    sees dr. at [facility]  . H/O: hysterectomy   . Heart murmur   . HTN (hypertension)   . Hypercholesteremia    Secondary to vascular disease  . PONV (postoperative nausea and vomiting)    DID FINE WITH CATARACT SURGERY  . Subclavian artery stenosis, right (HCC)    dopplers done 2/12;  carotids 2/12: 0-39% bilateral  . TIA (transient ischemic attack)     Past Surgical History:  Procedure Laterality Date  . ABDOMINAL HYSTERECTOMY    . APPENDECTOMY    . CARDIAC VALVE REPLACEMENT     08/28/16  . CARDIOVERSION N/A 08/18/2015   Procedure: CARDIOVERSION;  Surgeon: Dorothy Spark, MD;  Location: [place];  Service: Cardiovascular;  Laterality: N/A;  . CATARACT EXTRACTION W/PHACO Left 03/10/2017   Procedure: CATARACT EXTRACTION PHACO AND INTRAOCULAR LENS PLACEMENT ([place]);   Surgeon: Leandrew Koyanagi, MD;  Location: [facility];  Service: Ophthalmology;  Laterality: Left;  US01:16.5 AP%21.1 [id number] FLUID LOT # [id number]  . CATARACT EXTRACTION W/PHACO Right 04/16/2017   Procedure: CATARACT EXTRACTION PHACO AND INTRAOCULAR LENS PLACEMENT (IOC);  Surgeon: Leandrew Koyanagi, MD;  Location: [facility];  Service: Ophthalmology;  Laterality: Right;  [place] 01:13 AP% 20.3 CDE 14.93 Fluid pack lot # [id number]  . CORONARY ANGIOPLASTY     STENT 08/21/16  . KNEE SURGERY  3/06   Left  . MOHS SURGERY    . PROLAPSED UTERINE FIBROID LIGATION  7/05    No pertinent family history.  Social History:  reports that she has never smoked. She has never used smokeless tobacco. She reports that she does not drink alcohol or use drugs.   Allergies:  Allergies  Allergen Reactions  . Ciprofloxacin Other (See Comments)    tachycardia  . Escitalopram Nausea Only and Other (See Comments)    "lips puffed", "tight in chest", "nasuea", "dizzy"  . Ranitidine Diarrhea    Medications: reviewed  Results for orders placed or performed during the hospital encounter of 05/24/19 (from the past 48 hour(s))  Type and screen [place]     Status: None   Collection Time: 05/24/19 10:33 PM  Result Value  Ref Range   ABO/RH(D) O POS    Antibody Screen NEG    Sample Expiration      05/27/2019,2359 Performed at [facility], [place] [address] [place], [place] [address]   ABO/Rh     Status: None (Preliminary result)   Collection Time: 05/24/19 10:33 PM  Result Value Ref Range   ABO/RH(D)      O POS Performed at [facility] [address], [place], [place] [address]   Protime-INR     Status: Abnormal   Collection Time: 05/24/19 10:56 PM  Result Value Ref Range   Prothrombin Time 22.1 (H) 11.4 - 15.2 seconds   INR 2.0 (H) 0.8 - 1.2    Comment: (NOTE) INR goal varies based on device and disease states. Performed at [facility], [place] [address]  [place], [place] [address]   CBC     Status: Abnormal   Collection Time: 05/24/19 10:56 PM  Result Value Ref Range   WBC 14.0 (H) 4.0 - 10.5 K/uL   RBC 5.04 3.87 - 5.11 MIL/uL   Hemoglobin 13.9 12.0 - 15.0 g/dL   HCT 44.9 36.0 - 46.0 %   MCV 89.1 80.0 - 100.0 fL   MCH 27.6 26.0 - 34.0 pg   MCHC 31.0 30.0 - 36.0 g/dL   RDW 15.0 11.5 - 15.5 %   Platelets 316 150 - 400 K/uL   nRBC 0.0 0.0 - 0.2 %    Comment: Performed at [facility], [place] [address] [place], [place] [address]  Comprehensive metabolic panel     Status: Abnormal   Collection Time: 05/24/19 10:56 PM  Result Value Ref Range   Sodium 136 135 - 145 mmol/L   Potassium 4.9 3.5 - 5.1 mmol/L   Chloride 104 98 - 111 mmol/L   CO2 19 (L) 22 - 32 mmol/L   Glucose, Bld 145 (H) 70 - 99 mg/dL    Comment: Glucose reference range applies only to samples taken after fasting for at least 8 hours.   BUN 30 (H) 8 - 23 mg/dL   Creatinine, Ser 0.83 0.44 - 1.00 mg/dL   Calcium 9.5 8.9 - 10.3 mg/dL   Total Protein 7.3 6.5 - 8.1 g/dL   Albumin 4.1 3.5 - 5.0 g/dL   AST 55 (H) 15 - 41 U/L   ALT 43 0 - 44 U/L   Alkaline Phosphatase 151 (H) 38 - 126 U/L   Total Bilirubin 0.9 0.3 - 1.2 mg/dL   GFR calc non Af Amer >60 >60 mL/min   GFR calc Af Amer >60 >60 mL/min   Anion gap 13 5 - 15    Comment: Performed at [facility] [address], [place], [place] [address]  Respiratory Panel by RT PCR (Flu A&B, Covid) - Nasopharyngeal Swab     Status: None   Collection Time: 05/25/19  3:17 AM   Specimen: Nasopharyngeal Swab  Result Value Ref Range   SARS Coronavirus 2 by RT PCR NEGATIVE NEGATIVE    Comment: (NOTE) SARS-CoV-2 target nucleic acids are NOT DETECTED. The SARS-CoV-2 RNA is generally detectable in upper respiratoy specimens during the acute phase of infection. The lowest concentration of SARS-CoV-2 viral copies this assay can detect is 131 copies/mL. A negative result does not preclude SARS-Cov-2 infection and should not be  used as the sole basis for treatment or other patient management decisions. A negative result may occur with  improper specimen collection/handling, submission  of specimen other than nasopharyngeal swab, presence of viral mutation(s) within the areas targeted by this assay, and inadequate number of viral copies (<131 copies/mL). A negative result must be combined with clinical observations, patient history, and epidemiological information. The expected result is Negative. Fact Sheet for Patients:  PinkCheek.be Fact Sheet for Healthcare Providers:  GravelBags.it This test is not yet ap proved or cleared by the [place] FDA and  has been authorized for detection and/or diagnosis of SARS-CoV-2 by FDA under an Emergency Use Authorization (EUA). This EUA will remain  in effect (meaning this test can be used) for the duration of the COVID-19 declaration under Section 564(b)(1) of the Act, 21 U.S.C. section 360bbb-3(b)(1), unless the authorization is terminated or revoked sooner.    Influenza A by PCR NEGATIVE NEGATIVE   Influenza B by PCR NEGATIVE NEGATIVE    Comment: (NOTE) The Xpert Xpress SARS-CoV-2/FLU/RSV assay is intended as an aid in  the diagnosis of influenza from Nasopharyngeal swab specimens and  should not be used as a sole basis for treatment. Nasal washings and  aspirates are unacceptable for Xpert Xpress SARS-CoV-2/FLU/RSV  testing. Fact Sheet for Patients: PinkCheek.be Fact Sheet for Healthcare Providers: GravelBags.it This test is not yet approved or cleared by the [place] FDA and  has been authorized for detection and/or diagnosis of SARS-CoV-2 by  FDA under an Emergency Use Authorization (EUA). This EUA will remain  in effect (meaning this test can be used) for the duration of the  Covid-19 declaration under Section 564(b)(1) of the Act, 21    U.S.C. section 360bbb-3(b)(1), unless the authorization is  terminated or revoked. Performed at [facility], [place] [address], [place], [place] [address]     DG Wrist 2 Views Left  Result Date: 05/24/2019 CLINICAL DATA:  86 year old female status post reduction of the distal left radial fracture. EXAM: LEFT WRIST - 2 VIEW COMPARISON:  Earlier radiograph dated 05/24/2019. FINDINGS: Dorsally displaced and angulated, comminuted intra-articular fracture of the distal radius with no significant interval change in the degree of displacement or angulation since the earlier radiograph. There has been interval placement of overlying cast. IMPRESSION: Interval placement of a cast. No significant change in the degree of displacement of the distal radial fracture. Electronically Signed   By: Anner Crete M.D.   On: 05/24/2019 23:55   DG Wrist Complete Left  Result Date: 05/24/2019 CLINICAL DATA:  Fall, left wrist deformity EXAM: LEFT WRIST - COMPLETE 3+ VIEW COMPARISON:  None. FINDINGS: Dorsally angulated, impacted and mildly comminuted fracture the left radial metaphysis with extension into the distal radioulnar joint and likely extension to the radiocarpal articular surface along the medial aspect. There is some dorsal rotation of the lunate relative to the capitate in an alignment suspicious for dorsal intercalated segmental instability (DISI) findings on a background moderate arthrosis in the wrist most pronounced at the first carpometacarpal and triscaphe joints. Circumferential soft tissue swelling is noted. Extensive vascular calcium in the soft tissues. IMPRESSION: Dorsally angulated, impacted, and mildly comminuted fracture of the left radial metaphysis with extension into the distal radioulnar joint and likely the radiocarpal articular surface. Irregular appearance of the capitolunate angle, suspicious for a dorsal instability. Circumferential swelling. The osseous structures appear diffusely  demineralized which may limit detection of small or nondisplaced fractures. Electronically Signed   By: Lovena Le M.D.   On: 05/24/2019 23:14   CT Head Wo Contrast  Result Date: 05/24/2019 CLINICAL DATA:  Anticoagulated patient with minor head  trauma from falling and hitting her head. Chronic neck pain. EXAM: CT HEAD WITHOUT CONTRAST CT CERVICAL SPINE WITHOUT CONTRAST TECHNIQUE: Multidetector CT imaging of the head and cervical spine was performed following the standard protocol without intravenous contrast. Multiplanar CT image reconstructions of the cervical spine were also generated. COMPARISON:  None. February 16, 2018 imaging reports. FINDINGS: CT HEAD FINDINGS Brain: No acute intracranial hemorrhage, mass effect, midline shift, or abnormal extra-axial fluid collection. Extensive bilateral corona radiata white matter disease, likely microvascular ischemic changes. Chronic appearing lacunar infarction in the left insular ribbon. Diffuse mild cerebral atrophy and proportionate mild ventriculomegaly. Vascular: Intracranial calcified atherosclerosis of the anterior and posterior circulation. Skull: No acute fracture lucency. Diffuse demineralization. Benign calvarial hemangioma in the right occipital bone. Sinuses/Orbits: Mild bilateral ethmoidal and maxillary mucosal thickening. No acute air-fluid levels. Other: Chronic appearing bilateral nasal bone fractures with deviation of the tip of the nose towards the left. Small amount of cerumen in the right external auditory canal. Torus palatini. CT CERVICAL SPINE FINDINGS Alignment: Mild grade 1 retrolistheses of C4 on C5, C5 on C6, and C6 on C7, likely degenerative. Skull base and vertebrae: Mild degenerative anterior wedging of C4 and C5. No cervical compression fracture or fracture lucency. Normal cervico cranial alignment. Soft tissues and spinal canal: No prevertebral fluid or swelling. No visible canal hematoma. Possible severe atherosclerotic narrowing of  the left internal carotid artery origin. Disc levels:  No posttraumatic widening or displacement. Upper chest: Aorta calcified atherosclerosis. Benign coarsely calcified granuloma in the left upper lobe. Vertebrobasilar calcified atherosclerosis. Incidental imaging of a 10 mm peripherally calcified and additional smaller nodules in the thyroid gland; no imaging follow-up is indicated according to the ACR criteria. Other: Muscle atrophy and cachexia. IMPRESSION: No acute intracranial hemorrhage or edema. No acute bony injury to the cervical spine. Bilateral corona radiata white matter microvascular ischemic changes and chronic left lacunar infarction with diffuse mild cerebral atrophy. Atherosclerotic calcification of the anterior and posterior intracranial circulation. Moderate degenerative changes of the cervical spine with grade 1 retrolistheses from C4-C7 that are likely degenerative. Possible severe calcified atherosclerotic narrowing the right internal carotid artery origin which could be further evaluated with CT angiography of the neck as clinically appropriate. Thoracic aortic calcified atherosclerosis. Benign pulmonary calcified granuloma in the left lung apex. Diffuse bone demineralization. Electronically Signed   By: Revonda Humphrey   On: 05/24/2019 23:56   CT Chest W Contrast  Result Date: 05/25/2019 CLINICAL DATA:  86 year old female with abdominal trauma. EXAM: CT CHEST, ABDOMEN, AND PELVIS WITH CONTRAST TECHNIQUE: Multidetector CT imaging of the chest, abdomen and pelvis was performed following the standard protocol during bolus administration of intravenous contrast. CONTRAST:  118mL OMNIPAQUE IOHEXOL 300 MG/ML  SOLN COMPARISON:  Chest radiograph dated 05/24/2019 and CT dated 06/17/2015 FINDINGS: CT CHEST FINDINGS Cardiovascular: There is mild dilatation of the left atrium. Advanced calcification of the mitral annulus likely resulting in mitral stenosis. There is no pericardial effusion. Aortic  valve repair. Top-normal caliber ascending aorta measuring 4 cm in axial diameter. There is advanced atherosclerotic calcification of the aorta. No dissection. The origins of the great vessels of the aortic arch appear patent. The central pulmonary arteries appear unremarkable. Mediastinum/Nodes: There is no hilar or mediastinal adenopathy. The esophagus is grossly unremarkable. Bilateral thyroid nodules noted. No mediastinal fluid collection or hematoma. Lungs/Pleura: There are bibasilar subpleural atelectasis/scarring. No focal consolidation, pleural effusion, or pneumothorax. The central airways are patent. Musculoskeletal: There is advanced osteopenia and extensive degenerative changes of  the spine. T7 compression fracture with approximately greater than 50% loss of vertebral body height and anterior wedging, likely acute. Correlation with clinical exam and point tenderness recommended. No retropulsed fragment. There is focal sclerotic area with faint linear lucency in the superior aspect of the sternal body at the articulation with the manubrium which is also concerning for a nondisplaced acute fracture. Old-appearing right eleventh and twelfth rib fractures. There is chronic displaced fracture of the right humeral neck. CT ABDOMEN PELVIS FINDINGS No intra-abdominal free air or free fluid. Hepatobiliary: Mild irregularity of the liver contour may represent early changes of cirrhosis. No intrahepatic biliary ductal dilatation. The gallbladder is unremarkable. Pancreas: Unremarkable. No pancreatic ductal dilatation or surrounding inflammatory changes. Spleen: Normal in size without focal abnormality. Adrenals/Urinary Tract: The adrenal glands are unremarkable. There is a 3 cm right renal interpolar cyst. There is no hydronephrosis on either side. There is symmetric enhancement and excretion of contrast by both kidneys. The visualized ureters and urinary bladder appear unremarkable. Stomach/Bowel: There is large  amount of stool throughout the colon. There is no bowel obstruction or active inflammation. Appendectomy. Vascular/Lymphatic: Advanced aortoiliac atherosclerotic disease. The IVC is unremarkable. No portal venous gas. There is no adenopathy. Reproductive: Hysterectomy. No adnexal masses. Other: None Musculoskeletal: Osteopenia with severe multilevel degenerative changes and multilevel disc desiccation and vacuum phenomena. Old healed left pubic bone as well as right inferior pubic ramus fractures. Old L1 compression fracture with anterior wedging similar to prior CT. There is new compression fractures of the superior endplate of L5 and inferior endplate of L4. The L5 superior endplate appears acute or subacute. Clinical correlation recommended. No retropulsed fragment. IMPRESSION: 1. Advanced osteopenia with multilevel compression fractures as described. The T7, L4 and L5 compression fractures appear new since the prior studies and concerning for acute or subacute fractures. Correlation with point tenderness recommended. No retropulsed fragment. 2. Age indeterminate, possibly acute nondisplaced fracture of the superior is sternal body. 3. No other acute/traumatic intrathoracic, abdominal, or pelvic pathology. 4. Constipation. No bowel obstruction. 5. Aortic Atherosclerosis (ICD10-I70.0). Electronically Signed   By: Anner Crete M.D.   On: 05/25/2019 01:29   CT Cervical Spine Wo Contrast  Result Date: 05/24/2019 CLINICAL DATA:  Anticoagulated patient with minor head trauma from falling and hitting her head. Chronic neck pain. EXAM: CT HEAD WITHOUT CONTRAST CT CERVICAL SPINE WITHOUT CONTRAST TECHNIQUE: Multidetector CT imaging of the head and cervical spine was performed following the standard protocol without intravenous contrast. Multiplanar CT image reconstructions of the cervical spine were also generated. COMPARISON:  None. February 16, 2018 imaging reports. FINDINGS: CT HEAD FINDINGS Brain: No acute  intracranial hemorrhage, mass effect, midline shift, or abnormal extra-axial fluid collection. Extensive bilateral corona radiata white matter disease, likely microvascular ischemic changes. Chronic appearing lacunar infarction in the left insular ribbon. Diffuse mild cerebral atrophy and proportionate mild ventriculomegaly. Vascular: Intracranial calcified atherosclerosis of the anterior and posterior circulation. Skull: No acute fracture lucency. Diffuse demineralization. Benign calvarial hemangioma in the right occipital bone. Sinuses/Orbits: Mild bilateral ethmoidal and maxillary mucosal thickening. No acute air-fluid levels. Other: Chronic appearing bilateral nasal bone fractures with deviation of the tip of the nose towards the left. Small amount of cerumen in the right external auditory canal. Torus palatini. CT CERVICAL SPINE FINDINGS Alignment: Mild grade 1 retrolistheses of C4 on C5, C5 on C6, and C6 on C7, likely degenerative. Skull base and vertebrae: Mild degenerative anterior wedging of C4 and C5. No cervical compression fracture or fracture lucency. Normal  cervico cranial alignment. Soft tissues and spinal canal: No prevertebral fluid or swelling. No visible canal hematoma. Possible severe atherosclerotic narrowing of the left internal carotid artery origin. Disc levels:  No posttraumatic widening or displacement. Upper chest: Aorta calcified atherosclerosis. Benign coarsely calcified granuloma in the left upper lobe. Vertebrobasilar calcified atherosclerosis. Incidental imaging of a 10 mm peripherally calcified and additional smaller nodules in the thyroid gland; no imaging follow-up is indicated according to the ACR criteria. Other: Muscle atrophy and cachexia. IMPRESSION: No acute intracranial hemorrhage or edema. No acute bony injury to the cervical spine. Bilateral corona radiata white matter microvascular ischemic changes and chronic left lacunar infarction with diffuse mild cerebral atrophy.  Atherosclerotic calcification of the anterior and posterior intracranial circulation. Moderate degenerative changes of the cervical spine with grade 1 retrolistheses from C4-C7 that are likely degenerative. Possible severe calcified atherosclerotic narrowing the right internal carotid artery origin which could be further evaluated with CT angiography of the neck as clinically appropriate. Thoracic aortic calcified atherosclerosis. Benign pulmonary calcified granuloma in the left lung apex. Diffuse bone demineralization. Electronically Signed   By: Revonda Humphrey   On: 05/24/2019 23:56   CT ABDOMEN PELVIS W CONTRAST  Result Date: 05/25/2019 CLINICAL DATA:  86 year old female with abdominal trauma. EXAM: CT CHEST, ABDOMEN, AND PELVIS WITH CONTRAST TECHNIQUE: Multidetector CT imaging of the chest, abdomen and pelvis was performed following the standard protocol during bolus administration of intravenous contrast. CONTRAST:  18mL OMNIPAQUE IOHEXOL 300 MG/ML  SOLN COMPARISON:  Chest radiograph dated 05/24/2019 and CT dated 06/17/2015 FINDINGS: CT CHEST FINDINGS Cardiovascular: There is mild dilatation of the left atrium. Advanced calcification of the mitral annulus likely resulting in mitral stenosis. There is no pericardial effusion. Aortic valve repair. Top-normal caliber ascending aorta measuring 4 cm in axial diameter. There is advanced atherosclerotic calcification of the aorta. No dissection. The origins of the great vessels of the aortic arch appear patent. The central pulmonary arteries appear unremarkable. Mediastinum/Nodes: There is no hilar or mediastinal adenopathy. The esophagus is grossly unremarkable. Bilateral thyroid nodules noted. No mediastinal fluid collection or hematoma. Lungs/Pleura: There are bibasilar subpleural atelectasis/scarring. No focal consolidation, pleural effusion, or pneumothorax. The central airways are patent. Musculoskeletal: There is advanced osteopenia and extensive  degenerative changes of the spine. T7 compression fracture with approximately greater than 50% loss of vertebral body height and anterior wedging, likely acute. Correlation with clinical exam and point tenderness recommended. No retropulsed fragment. There is focal sclerotic area with faint linear lucency in the superior aspect of the sternal body at the articulation with the manubrium which is also concerning for a nondisplaced acute fracture. Old-appearing right eleventh and twelfth rib fractures. There is chronic displaced fracture of the right humeral neck. CT ABDOMEN PELVIS FINDINGS No intra-abdominal free air or free fluid. Hepatobiliary: Mild irregularity of the liver contour may represent early changes of cirrhosis. No intrahepatic biliary ductal dilatation. The gallbladder is unremarkable. Pancreas: Unremarkable. No pancreatic ductal dilatation or surrounding inflammatory changes. Spleen: Normal in size without focal abnormality. Adrenals/Urinary Tract: The adrenal glands are unremarkable. There is a 3 cm right renal interpolar cyst. There is no hydronephrosis on either side. There is symmetric enhancement and excretion of contrast by both kidneys. The visualized ureters and urinary bladder appear unremarkable. Stomach/Bowel: There is large amount of stool throughout the colon. There is no bowel obstruction or active inflammation. Appendectomy. Vascular/Lymphatic: Advanced aortoiliac atherosclerotic disease. The IVC is unremarkable. No portal venous gas. There is no adenopathy. Reproductive: Hysterectomy. No adnexal  masses. Other: None Musculoskeletal: Osteopenia with severe multilevel degenerative changes and multilevel disc desiccation and vacuum phenomena. Old healed left pubic bone as well as right inferior pubic ramus fractures. Old L1 compression fracture with anterior wedging similar to prior CT. There is new compression fractures of the superior endplate of L5 and inferior endplate of L4. The L5  superior endplate appears acute or subacute. Clinical correlation recommended. No retropulsed fragment. IMPRESSION: 1. Advanced osteopenia with multilevel compression fractures as described. The T7, L4 and L5 compression fractures appear new since the prior studies and concerning for acute or subacute fractures. Correlation with point tenderness recommended. No retropulsed fragment. 2. Age indeterminate, possibly acute nondisplaced fracture of the superior is sternal body. 3. No other acute/traumatic intrathoracic, abdominal, or pelvic pathology. 4. Constipation. No bowel obstruction. 5. Aortic Atherosclerosis (ICD10-I70.0). Electronically Signed   By: Anner Crete M.D.   On: 05/25/2019 01:29   DG Pelvis Portable  Result Date: 05/24/2019 CLINICAL DATA:  Fall, level 2 trauma EXAM: PORTABLE PELVIS 1-2 VIEWS COMPARISON:  Radiograph Jul 29, 2018 FINDINGS: Cortical step-off at the right femoral head neck junction is suspicious for a varus angulated subcapital femoral neck fracture. There is a subacute fracture of the right inferior pubic ramus with additional irregularity at the right inferior pubic body which is also likely subacute in nature. Stable remote deformity of the left pubic root and inferior ramus. Severe degenerative changes in the left hip. More moderate degenerative change in the right hip. Multilevel degenerative changes are present in the imaged portions of the spine. Additional sclerotic features in the SI joints without diastatic widening of the SI or symphysis pubis. Extensive vascular calcium noted in the pelvis and medial thighs as well as within the abdominal aorta. Bowel gas pattern is unremarkable. IMPRESSION: 1. Probable varus angulated subcapital right femoral neck fracture. Femoral heads remain located. 2. Subacute fractures of the right inferior pubic ramus and right inferior pubic body. 3. Severe left and moderate right hip osteoarthritis. 4. Bones are diffusely demineralized which  may limit detection of subtle or nondisplaced fractures. Electronically Signed   By: Lovena Le M.D.   On: 05/24/2019 23:10   DG Chest Port 1 View  Result Date: 05/24/2019 CLINICAL DATA:  Fall today, level 2 trauma EXAM: PORTABLE CHEST 1 VIEW COMPARISON:  Radiograph 03/31/2019, CT 06/17/2015 FINDINGS: Chronically coarsened interstitial changes in the lungs with some bandlike areas of scarring and/or atelectasis in the left mid lung and left lung base. Stable positioning of an aortic valve stent repair. Extensive mitral annular calcifications are present. No consolidation, features of edema, pneumothorax, or effusion. Chronic deformity of the right proximal humerus. Degenerative changes are present in the imaged spine and shoulders. IMPRESSION: Bandlike areas of atelectasis or scarring in the left lung base. No acute cardiopulmonary abnormality. Chronic deformity of the proximal right humerus. Electronically Signed   By: Lovena Le M.D.   On: 05/24/2019 23:07    ROS 10 point review of systems is negative except as listed above in HPI.  Blood pressure (!) 180/122, pulse 68, temperature 97.9 F (36.6 C), temperature source Oral, resp. rate 19, height 5' (1.524 m), weight 40.8 kg, SpO2 96 %.  Secondary Survey:  GCS: E(4)//V(5)//M(6) Constitutional: well-developed, well-nourished Skull: normocephalic, atraumatic Eyes: pupils equal, round, reactive to light, 33mm b/l, moist conjunctiva Face/ENT: midface stable without deformity, poor dentition ENT: external inspection of ears and nose normal, hearing diminished Oropharynx: normal oropharyngeal mucosa, no blood Neck: no thyromegaly, trachea midline,no midline cervical tenderness  to palpation, no C-spine stepoffs Chest: breath sounds equal bilaterally, normal respiratory effort, no midline or lateral chest wall tenderness to palpation/deformity Abdomen: soft, TTP in the epigastrium, no bruising, no hepatosplenomegaly Pelvis: stable GU: normal  female genitalia Back: no wounds, no T/L spine TTP, no T/L spine stepoffs Extremities: 2+ radial and pedal pulses bilaterally, motor and sensation intact to bilateral UE and LE, no peripheral edema MSK: unable to assess gait/station no clubbing/cyanosis of fingers/toes, baseline ROM of all RUE/RLE/LLE, LUE splinted Skin: warm, dry, no rashes    Assessment/Plan: Problem List 49F s/p mechanical GLF  Plan Sternal fracture - LBBB on EKG-stable, troponin pending, cardiac monitoring T7/L4/L5 compression fractures - NSGY c/s (Dr. Ronnald Ramp) L distal radius fracture - Hand surgery c/s (Dr. Lenon Curt) by ED, incompletely reduced on post-splint films. Will re-address with Ortho/Hand.  FEN - regular diet DVT - SCDs, continue home AC. Plan for discussion with family about risks/benefits of AC in the setting of frequent falls.  Dispo - Admit to inpatient--step-down  Jesusita Oka, MD General [facility]

## 2019-05-25 NOTE — Progress Notes (Signed)
Patient was admitted this morning after a GLF.  Appears the patient has had multiple falls over the last several months.  She had a splint placed on her left wrist and will follow up with ortho as an outpatient.  She also has a corset on for her compression fractures.  She denies any pain right now, but has not mobilized yet today either.  She denies any chest pain, but her EKG on the monitor shows some ST elevation in leads II, III, and AVF.  It does appear somewhat different from her EKG this morning.  We will recheck a new EKG this afternoon to assure no new issues with this. (new EKG just completed and still confirms just a LBBB and sinus rhythm).  PT/OT ordered and will await their evaluation of the patient to determine disposition.  Daughter was present in the room when we saw the patient and we discussed findings as well as plan with her as well.  Henreitta Cea 4:14 PM 05/25/2019

## 2019-05-25 NOTE — ED Provider Notes (Signed)
Patient signed out to me to follow-up on CT scans.  Patient was originally referred to the ER after she had left wrist fracture identified at urgent care.  Patient had a ground-level fall earlier today.  Initial x-rays raised concern for possible right hip fracture.  There was also some evidence of other chronic fractures seen in therefore patient underwent CT scan chest, abdomen, pelvis to further evaluate.  The results of the scans have shown acute compression fractures in the thoracic and lumbar spine.  Additionally there is a sternal fracture seen.  Repeat examination reveals diffuse midline tenderness of the spine as well as sternal tenderness.  Patient has significant pain with trying to sit up from a lying position in both of these areas.  Fracture is therefore likely acute.  CT scan did not suggest hip fracture.  I did go back and reexamined her and she has full range of motion of the right hip.  Case discussed with Dr. Bobbye Morton, on-call for trauma surgery.  Will evaluate patient for admission.  I did consult Dr. Lenon Curt, on-call for hand surgery.  He will consult on the patient for further care of the left wrist fracture.   Orpah Greek, MD 05/25/19 [phone]

## 2019-05-25 NOTE — ED Notes (Signed)
Medications given and charted per MAR. Tolerated well.  

## 2019-05-25 NOTE — Consult Note (Addendum)
Reason for Consult: thoracic and lumbar compression fxs Referring Physician: dr. Gwinda Maine is an 86 y.o. female.   HPI:  86 year old female presented to the ED last night after sustaining a fall. She apparently has had multiple falls over the last several months. Today she reports some lower back pain but no radicular pain in her legs. Denies any NT or weakness in her legs. She feels like she hasnt been as stable on her feet lately  Past Medical History:  Diagnosis Date  . Arthritis   . Atrial fibrillation (HCC)    persistent, coumadin per PCP;  echo 06/06/09: EF 60%, mild LVH, Gr 1 diast dysfx, AV gradient mean 9, mod MR, mod LAE;  Myoview 11/09: normal, EF 81%  . Back pain   . Cancer ([place])   . Complication of anesthesia   . Coronary artery disease   . CVA (cerebral vascular accident) ([place])    2006  . Depression   . Disc disorder    STEROID INJECTION 12/09/16  . Dyspnea    DOE  . Dysrhythmia   . Fatigue   . Giant cell arteritis [facility])    sees dr. at [facility]  . H/O: hysterectomy   . Heart murmur   . HTN (hypertension)   . Hypercholesteremia    Secondary to vascular disease  . PONV (postoperative nausea and vomiting)    DID FINE WITH CATARACT SURGERY  . Subclavian artery stenosis, right (HCC)    dopplers done 2/12;  carotids 2/12: 0-39% bilateral  . TIA (transient ischemic attack)     Past Surgical History:  Procedure Laterality Date  . ABDOMINAL HYSTERECTOMY    . APPENDECTOMY    . CARDIAC VALVE REPLACEMENT     08/28/16  . CARDIOVERSION N/A 08/18/2015   Procedure: CARDIOVERSION;  Surgeon: Dorothy Spark, MD;  Location: [place];  Service: Cardiovascular;  Laterality: N/A;  . CATARACT EXTRACTION W/PHACO Left 03/10/2017   Procedure: CATARACT EXTRACTION PHACO AND INTRAOCULAR LENS PLACEMENT ([place]);  Surgeon: Leandrew Koyanagi, MD;  Location: [facility];  Service: Ophthalmology;  Laterality: Left;  US01:16.5 AP%21.1 [id number] FLUID LOT # [id number]  . CATARACT  EXTRACTION W/PHACO Right 04/16/2017   Procedure: CATARACT EXTRACTION PHACO AND INTRAOCULAR LENS PLACEMENT ([facility]);  Surgeon: Leandrew Koyanagi, MD;  Location: [facility];  Service: Ophthalmology;  Laterality: Right;  [place] 01:13 AP% 20.3 CDE 14.93 Fluid pack lot # [id number]  . CORONARY ANGIOPLASTY     STENT 08/21/16  . KNEE SURGERY  3/06   Left  . MOHS SURGERY    . PROLAPSED UTERINE FIBROID LIGATION  7/05    Allergies  Allergen Reactions  . Ciprofloxacin Other (See Comments)    tachycardia  . Escitalopram Nausea Only and Other (See Comments)    "lips puffed", "tight in chest", "nasuea", "dizzy"  . Ranitidine Diarrhea    Social History   Tobacco Use  . Smoking status: Never Smoker  . Smokeless tobacco: Never Used  Substance Use Topics  . Alcohol use: No    Family History  Problem Relation Age of Onset  . Hypertension Daughter   . Hypertension Other   . Stroke Neg Hx   . Diabetes type II Neg Hx      Review of Systems  Positive ROS: as above  All other systems have been reviewed and were otherwise negative with the exception of those mentioned in the HPI and as above.  Objective: Vital signs in last 24 hours: Temp:  [97.9 F (  36.6 C)] 97.9 F (36.6 C) (03/15 2226) Pulse Rate:  [68-89] 76 (03/16 0753) Resp:  [17-26] 18 (03/16 0753) BP: (90-185)/(42-124) 145/108 (03/16 0753) SpO2:  [90 %-98 %] 95 % (03/16 0753) Weight:  [40.8 kg] 40.8 kg (03/15 2306)  General Appearance: Alert, cooperative, no distress, appears stated age Head: Normocephalic, without obvious abnormality, atraumatic Eyes: PERRL, conjunctiva/corneas clear, EOM's intact, fundi benign, both eyes      Lungs:  respirations unlabored Heart: Regular rate and rhythm Extremities: Extremities normal, atraumatic, no cyanosis or edema, left arm casted Pulses: 2+ and symmetric all extremities Skin: Skin color, texture, turgor normal, no rashes or lesions  NEUROLOGIC:   Mental status: A&O x4, no aphasia, good  attention span, Memory and fund of knowledge Motor Exam - grossly normal, normal tone and bulk Sensory Exam - grossly normal Reflexes: symmetric, no pathologic reflexes, No Hoffman's, No clonus Coordination - grossly normal Gait - not tested Balance - not tested Cranial Nerves: I: smell Not tested  II: visual acuity  OS: na   OD: na  II: visual fields Full to confrontation  II: pupils Equal, round, reactive to light  III,VII: ptosis None  III,IV,VI: extraocular muscles  Full ROM  V: mastication   V: facial light touch sensation    V,VII: corneal reflex    VII: facial muscle function - upper    VII: facial muscle function - lower   VIII: hearing   IX: soft palate elevation    IX,X: gag reflex   XI: trapezius strength    XI: sternocleidomastoid strength   XI: neck flexion strength    XII: tongue strength      Data Review Lab Results  Component Value Date   WBC 14.0 (H) 05/24/2019   HGB 13.9 05/24/2019   HCT 44.9 05/24/2019   MCV 89.1 05/24/2019   PLT 316 05/24/2019   Lab Results  Component Value Date   NA 136 05/24/2019   K 4.9 05/24/2019   CL 104 05/24/2019   CO2 19 (L) 05/24/2019   BUN 30 (H) 05/24/2019   CREATININE 0.83 05/24/2019   GLUCOSE 145 (H) 05/24/2019   Lab Results  Component Value Date   INR 2.0 (H) 05/24/2019    Radiology: DG Wrist 2 Views Left  Result Date: 05/24/2019 CLINICAL DATA:  86 year old female status post reduction of the distal left radial fracture. EXAM: LEFT WRIST - 2 VIEW COMPARISON:  Earlier radiograph dated 05/24/2019. FINDINGS: Dorsally displaced and angulated, comminuted intra-articular fracture of the distal radius with no significant interval change in the degree of displacement or angulation since the earlier radiograph. There has been interval placement of overlying cast. IMPRESSION: Interval placement of a cast. No significant change in the degree of displacement of the distal radial fracture. Electronically Signed   By: Anner Crete M.D.   On: 05/24/2019 23:55   DG Wrist Complete Left  Result Date: 05/24/2019 CLINICAL DATA:  Fall, left wrist deformity EXAM: LEFT WRIST - COMPLETE 3+ VIEW COMPARISON:  None. FINDINGS: Dorsally angulated, impacted and mildly comminuted fracture the left radial metaphysis with extension into the distal radioulnar joint and likely extension to the radiocarpal articular surface along the medial aspect. There is some dorsal rotation of the lunate relative to the capitate in an alignment suspicious for dorsal intercalated segmental instability (DISI) findings on a background moderate arthrosis in the wrist most pronounced at the first carpometacarpal and triscaphe joints. Circumferential soft tissue swelling is noted. Extensive vascular calcium in the soft tissues. IMPRESSION:  Dorsally angulated, impacted, and mildly comminuted fracture of the left radial metaphysis with extension into the distal radioulnar joint and likely the radiocarpal articular surface. Irregular appearance of the capitolunate angle, suspicious for a dorsal instability. Circumferential swelling. The osseous structures appear diffusely demineralized which may limit detection of small or nondisplaced fractures. Electronically Signed   By: Lovena Le M.D.   On: 05/24/2019 23:14   CT Head Wo Contrast  Result Date: 05/24/2019 CLINICAL DATA:  Anticoagulated patient with minor head trauma from falling and hitting her head. Chronic neck pain. EXAM: CT HEAD WITHOUT CONTRAST CT CERVICAL SPINE WITHOUT CONTRAST TECHNIQUE: Multidetector CT imaging of the head and cervical spine was performed following the standard protocol without intravenous contrast. Multiplanar CT image reconstructions of the cervical spine were also generated. COMPARISON:  None. February 16, 2018 imaging reports. FINDINGS: CT HEAD FINDINGS Brain: No acute intracranial hemorrhage, mass effect, midline shift, or abnormal extra-axial fluid collection. Extensive bilateral  corona radiata white matter disease, likely microvascular ischemic changes. Chronic appearing lacunar infarction in the left insular ribbon. Diffuse mild cerebral atrophy and proportionate mild ventriculomegaly. Vascular: Intracranial calcified atherosclerosis of the anterior and posterior circulation. Skull: No acute fracture lucency. Diffuse demineralization. Benign calvarial hemangioma in the right occipital bone. Sinuses/Orbits: Mild bilateral ethmoidal and maxillary mucosal thickening. No acute air-fluid levels. Other: Chronic appearing bilateral nasal bone fractures with deviation of the tip of the nose towards the left. Small amount of cerumen in the right external auditory canal. Torus palatini. CT CERVICAL SPINE FINDINGS Alignment: Mild grade 1 retrolistheses of C4 on C5, C5 on C6, and C6 on C7, likely degenerative. Skull base and vertebrae: Mild degenerative anterior wedging of C4 and C5. No cervical compression fracture or fracture lucency. Normal cervico cranial alignment. Soft tissues and spinal canal: No prevertebral fluid or swelling. No visible canal hematoma. Possible severe atherosclerotic narrowing of the left internal carotid artery origin. Disc levels:  No posttraumatic widening or displacement. Upper chest: Aorta calcified atherosclerosis. Benign coarsely calcified granuloma in the left upper lobe. Vertebrobasilar calcified atherosclerosis. Incidental imaging of a 10 mm peripherally calcified and additional smaller nodules in the thyroid gland; no imaging follow-up is indicated according to the ACR criteria. Other: Muscle atrophy and cachexia. IMPRESSION: No acute intracranial hemorrhage or edema. No acute bony injury to the cervical spine. Bilateral corona radiata white matter microvascular ischemic changes and chronic left lacunar infarction with diffuse mild cerebral atrophy. Atherosclerotic calcification of the anterior and posterior intracranial circulation. Moderate degenerative changes  of the cervical spine with grade 1 retrolistheses from C4-C7 that are likely degenerative. Possible severe calcified atherosclerotic narrowing the right internal carotid artery origin which could be further evaluated with CT angiography of the neck as clinically appropriate. Thoracic aortic calcified atherosclerosis. Benign pulmonary calcified granuloma in the left lung apex. Diffuse bone demineralization. Electronically Signed   By: Revonda Humphrey   On: 05/24/2019 23:56   CT Chest W Contrast  Result Date: 05/25/2019 CLINICAL DATA:  86 year old female with abdominal trauma. EXAM: CT CHEST, ABDOMEN, AND PELVIS WITH CONTRAST TECHNIQUE: Multidetector CT imaging of the chest, abdomen and pelvis was performed following the standard protocol during bolus administration of intravenous contrast. CONTRAST:  11mL OMNIPAQUE IOHEXOL 300 MG/ML  SOLN COMPARISON:  Chest radiograph dated 05/24/2019 and CT dated 06/17/2015 FINDINGS: CT CHEST FINDINGS Cardiovascular: There is mild dilatation of the left atrium. Advanced calcification of the mitral annulus likely resulting in mitral stenosis. There is no pericardial effusion. Aortic valve repair. Top-normal caliber ascending aorta  measuring 4 cm in axial diameter. There is advanced atherosclerotic calcification of the aorta. No dissection. The origins of the great vessels of the aortic arch appear patent. The central pulmonary arteries appear unremarkable. Mediastinum/Nodes: There is no hilar or mediastinal adenopathy. The esophagus is grossly unremarkable. Bilateral thyroid nodules noted. No mediastinal fluid collection or hematoma. Lungs/Pleura: There are bibasilar subpleural atelectasis/scarring. No focal consolidation, pleural effusion, or pneumothorax. The central airways are patent. Musculoskeletal: There is advanced osteopenia and extensive degenerative changes of the spine. T7 compression fracture with approximately greater than 50% loss of vertebral body height and  anterior wedging, likely acute. Correlation with clinical exam and point tenderness recommended. No retropulsed fragment. There is focal sclerotic area with faint linear lucency in the superior aspect of the sternal body at the articulation with the manubrium which is also concerning for a nondisplaced acute fracture. Old-appearing right eleventh and twelfth rib fractures. There is chronic displaced fracture of the right humeral neck. CT ABDOMEN PELVIS FINDINGS No intra-abdominal free air or free fluid. Hepatobiliary: Mild irregularity of the liver contour may represent early changes of cirrhosis. No intrahepatic biliary ductal dilatation. The gallbladder is unremarkable. Pancreas: Unremarkable. No pancreatic ductal dilatation or surrounding inflammatory changes. Spleen: Normal in size without focal abnormality. Adrenals/Urinary Tract: The adrenal glands are unremarkable. There is a 3 cm right renal interpolar cyst. There is no hydronephrosis on either side. There is symmetric enhancement and excretion of contrast by both kidneys. The visualized ureters and urinary bladder appear unremarkable. Stomach/Bowel: There is large amount of stool throughout the colon. There is no bowel obstruction or active inflammation. Appendectomy. Vascular/Lymphatic: Advanced aortoiliac atherosclerotic disease. The IVC is unremarkable. No portal venous gas. There is no adenopathy. Reproductive: Hysterectomy. No adnexal masses. Other: None Musculoskeletal: Osteopenia with severe multilevel degenerative changes and multilevel disc desiccation and vacuum phenomena. Old healed left pubic bone as well as right inferior pubic ramus fractures. Old L1 compression fracture with anterior wedging similar to prior CT. There is new compression fractures of the superior endplate of L5 and inferior endplate of L4. The L5 superior endplate appears acute or subacute. Clinical correlation recommended. No retropulsed fragment. IMPRESSION: 1. Advanced  osteopenia with multilevel compression fractures as described. The T7, L4 and L5 compression fractures appear new since the prior studies and concerning for acute or subacute fractures. Correlation with point tenderness recommended. No retropulsed fragment. 2. Age indeterminate, possibly acute nondisplaced fracture of the superior is sternal body. 3. No other acute/traumatic intrathoracic, abdominal, or pelvic pathology. 4. Constipation. No bowel obstruction. 5. Aortic Atherosclerosis (ICD10-I70.0). Electronically Signed   By: Anner Crete M.D.   On: 05/25/2019 01:29   CT Cervical Spine Wo Contrast  Result Date: 05/24/2019 CLINICAL DATA:  Anticoagulated patient with minor head trauma from falling and hitting her head. Chronic neck pain. EXAM: CT HEAD WITHOUT CONTRAST CT CERVICAL SPINE WITHOUT CONTRAST TECHNIQUE: Multidetector CT imaging of the head and cervical spine was performed following the standard protocol without intravenous contrast. Multiplanar CT image reconstructions of the cervical spine were also generated. COMPARISON:  None. February 16, 2018 imaging reports. FINDINGS: CT HEAD FINDINGS Brain: No acute intracranial hemorrhage, mass effect, midline shift, or abnormal extra-axial fluid collection. Extensive bilateral corona radiata white matter disease, likely microvascular ischemic changes. Chronic appearing lacunar infarction in the left insular ribbon. Diffuse mild cerebral atrophy and proportionate mild ventriculomegaly. Vascular: Intracranial calcified atherosclerosis of the anterior and posterior circulation. Skull: No acute fracture lucency. Diffuse demineralization. Benign calvarial hemangioma in the right occipital  bone. Sinuses/Orbits: Mild bilateral ethmoidal and maxillary mucosal thickening. No acute air-fluid levels. Other: Chronic appearing bilateral nasal bone fractures with deviation of the tip of the nose towards the left. Small amount of cerumen in the right external auditory  canal. Torus palatini. CT CERVICAL SPINE FINDINGS Alignment: Mild grade 1 retrolistheses of C4 on C5, C5 on C6, and C6 on C7, likely degenerative. Skull base and vertebrae: Mild degenerative anterior wedging of C4 and C5. No cervical compression fracture or fracture lucency. Normal cervico cranial alignment. Soft tissues and spinal canal: No prevertebral fluid or swelling. No visible canal hematoma. Possible severe atherosclerotic narrowing of the left internal carotid artery origin. Disc levels:  No posttraumatic widening or displacement. Upper chest: Aorta calcified atherosclerosis. Benign coarsely calcified granuloma in the left upper lobe. Vertebrobasilar calcified atherosclerosis. Incidental imaging of a 10 mm peripherally calcified and additional smaller nodules in the thyroid gland; no imaging follow-up is indicated according to the ACR criteria. Other: Muscle atrophy and cachexia. IMPRESSION: No acute intracranial hemorrhage or edema. No acute bony injury to the cervical spine. Bilateral corona radiata white matter microvascular ischemic changes and chronic left lacunar infarction with diffuse mild cerebral atrophy. Atherosclerotic calcification of the anterior and posterior intracranial circulation. Moderate degenerative changes of the cervical spine with grade 1 retrolistheses from C4-C7 that are likely degenerative. Possible severe calcified atherosclerotic narrowing the right internal carotid artery origin which could be further evaluated with CT angiography of the neck as clinically appropriate. Thoracic aortic calcified atherosclerosis. Benign pulmonary calcified granuloma in the left lung apex. Diffuse bone demineralization. Electronically Signed   By: Revonda Humphrey   On: 05/24/2019 23:56   CT ABDOMEN PELVIS W CONTRAST  Result Date: 05/25/2019 CLINICAL DATA:  86 year old female with abdominal trauma. EXAM: CT CHEST, ABDOMEN, AND PELVIS WITH CONTRAST TECHNIQUE: Multidetector CT imaging of the  chest, abdomen and pelvis was performed following the standard protocol during bolus administration of intravenous contrast. CONTRAST:  184mL OMNIPAQUE IOHEXOL 300 MG/ML  SOLN COMPARISON:  Chest radiograph dated 05/24/2019 and CT dated 06/17/2015 FINDINGS: CT CHEST FINDINGS Cardiovascular: There is mild dilatation of the left atrium. Advanced calcification of the mitral annulus likely resulting in mitral stenosis. There is no pericardial effusion. Aortic valve repair. Top-normal caliber ascending aorta measuring 4 cm in axial diameter. There is advanced atherosclerotic calcification of the aorta. No dissection. The origins of the great vessels of the aortic arch appear patent. The central pulmonary arteries appear unremarkable. Mediastinum/Nodes: There is no hilar or mediastinal adenopathy. The esophagus is grossly unremarkable. Bilateral thyroid nodules noted. No mediastinal fluid collection or hematoma. Lungs/Pleura: There are bibasilar subpleural atelectasis/scarring. No focal consolidation, pleural effusion, or pneumothorax. The central airways are patent. Musculoskeletal: There is advanced osteopenia and extensive degenerative changes of the spine. T7 compression fracture with approximately greater than 50% loss of vertebral body height and anterior wedging, likely acute. Correlation with clinical exam and point tenderness recommended. No retropulsed fragment. There is focal sclerotic area with faint linear lucency in the superior aspect of the sternal body at the articulation with the manubrium which is also concerning for a nondisplaced acute fracture. Old-appearing right eleventh and twelfth rib fractures. There is chronic displaced fracture of the right humeral neck. CT ABDOMEN PELVIS FINDINGS No intra-abdominal free air or free fluid. Hepatobiliary: Mild irregularity of the liver contour may represent early changes of cirrhosis. No intrahepatic biliary ductal dilatation. The gallbladder is unremarkable.  Pancreas: Unremarkable. No pancreatic ductal dilatation or surrounding inflammatory changes. Spleen: Normal in  size without focal abnormality. Adrenals/Urinary Tract: The adrenal glands are unremarkable. There is a 3 cm right renal interpolar cyst. There is no hydronephrosis on either side. There is symmetric enhancement and excretion of contrast by both kidneys. The visualized ureters and urinary bladder appear unremarkable. Stomach/Bowel: There is large amount of stool throughout the colon. There is no bowel obstruction or active inflammation. Appendectomy. Vascular/Lymphatic: Advanced aortoiliac atherosclerotic disease. The IVC is unremarkable. No portal venous gas. There is no adenopathy. Reproductive: Hysterectomy. No adnexal masses. Other: None Musculoskeletal: Osteopenia with severe multilevel degenerative changes and multilevel disc desiccation and vacuum phenomena. Old healed left pubic bone as well as right inferior pubic ramus fractures. Old L1 compression fracture with anterior wedging similar to prior CT. There is new compression fractures of the superior endplate of L5 and inferior endplate of L4. The L5 superior endplate appears acute or subacute. Clinical correlation recommended. No retropulsed fragment. IMPRESSION: 1. Advanced osteopenia with multilevel compression fractures as described. The T7, L4 and L5 compression fractures appear new since the prior studies and concerning for acute or subacute fractures. Correlation with point tenderness recommended. No retropulsed fragment. 2. Age indeterminate, possibly acute nondisplaced fracture of the superior is sternal body. 3. No other acute/traumatic intrathoracic, abdominal, or pelvic pathology. 4. Constipation. No bowel obstruction. 5. Aortic Atherosclerosis (ICD10-I70.0). Electronically Signed   By: Anner Crete M.D.   On: 05/25/2019 01:29   DG Pelvis Portable  Result Date: 05/24/2019 CLINICAL DATA:  Fall, level 2 trauma EXAM: PORTABLE  PELVIS 1-2 VIEWS COMPARISON:  Radiograph Jul 29, 2018 FINDINGS: Cortical step-off at the right femoral head neck junction is suspicious for a varus angulated subcapital femoral neck fracture. There is a subacute fracture of the right inferior pubic ramus with additional irregularity at the right inferior pubic body which is also likely subacute in nature. Stable remote deformity of the left pubic root and inferior ramus. Severe degenerative changes in the left hip. More moderate degenerative change in the right hip. Multilevel degenerative changes are present in the imaged portions of the spine. Additional sclerotic features in the SI joints without diastatic widening of the SI or symphysis pubis. Extensive vascular calcium noted in the pelvis and medial thighs as well as within the abdominal aorta. Bowel gas pattern is unremarkable. IMPRESSION: 1. Probable varus angulated subcapital right femoral neck fracture. Femoral heads remain located. 2. Subacute fractures of the right inferior pubic ramus and right inferior pubic body. 3. Severe left and moderate right hip osteoarthritis. 4. Bones are diffusely demineralized which may limit detection of subtle or nondisplaced fractures. Electronically Signed   By: Lovena Le M.D.   On: 05/24/2019 23:10   DG Chest Port 1 View  Result Date: 05/24/2019 CLINICAL DATA:  Fall today, level 2 trauma EXAM: PORTABLE CHEST 1 VIEW COMPARISON:  Radiograph 03/31/2019, CT 06/17/2015 FINDINGS: Chronically coarsened interstitial changes in the lungs with some bandlike areas of scarring and/or atelectasis in the left mid lung and left lung base. Stable positioning of an aortic valve stent repair. Extensive mitral annular calcifications are present. No consolidation, features of edema, pneumothorax, or effusion. Chronic deformity of the right proximal humerus. Degenerative changes are present in the imaged spine and shoulders. IMPRESSION: Bandlike areas of atelectasis or scarring in the  left lung base. No acute cardiopulmonary abnormality. Chronic deformity of the proximal right humerus. Electronically Signed   By: Lovena Le M.D.   On: 05/24/2019 23:07    Assessment/Plan: 86 year old female presented to the ED last  night after sustaining a fall at home. CT shows compression fracture T7 with 50% vertebral height loss but no bony retropulsion. She also has mild compression fractures of L4 and L5 with no retropulsion into the canal. These are new compared to previous studies however given her history of multiple falls over the last 6 months these are age indeterminate. No surgical intervention needed at this time. Would recommend a quick draw lumbar corset brace and can follow up in the office with [place] in a couple weeks.    Ocie Cornfield [facility] 05/25/2019 8:04 AM

## 2019-05-25 NOTE — ED Notes (Signed)
Lunch Tray Ordered @ 1056. 

## 2019-05-25 NOTE — ED Notes (Signed)
Pt resting in bed. Pt denies new or worsening complaints. Will continue to monitor. No distress noted. Pt on continuous monitoring via blood pressure, pulse ox, and cardiac monitor.  

## 2019-05-25 NOTE — ED Notes (Signed)
Dr. Grandville Silos and Dr. Maxwell Caul here to eval pt. Additional orders for EKG. Pt denies any changes at this time.    Linens changed and pt taking fluids well.

## 2019-05-25 NOTE — Progress Notes (Signed)
Pt's L Wrist XRay reviewed.  Pt has mild dorsal displaced L distal radius fracture.  Cont with splint, elevation, nwb.  Pt may f/u in office.

## 2019-05-25 NOTE — Progress Notes (Signed)
admission from the ED by stretcher awake and alert.

## 2019-05-26 ENCOUNTER — Inpatient Hospital Stay (HOSPITAL_COMMUNITY): Payer: Medicare HMO

## 2019-05-26 DIAGNOSIS — I447 Left bundle-branch block, unspecified: Secondary | ICD-10-CM

## 2019-05-26 DIAGNOSIS — I498 Other specified cardiac arrhythmias: Secondary | ICD-10-CM

## 2019-05-26 DIAGNOSIS — Z952 Presence of prosthetic heart valve: Secondary | ICD-10-CM

## 2019-05-26 DIAGNOSIS — Z01810 Encounter for preprocedural cardiovascular examination: Secondary | ICD-10-CM

## 2019-05-26 DIAGNOSIS — I350 Nonrheumatic aortic (valve) stenosis: Secondary | ICD-10-CM

## 2019-05-26 DIAGNOSIS — I351 Nonrheumatic aortic (valve) insufficiency: Secondary | ICD-10-CM

## 2019-05-26 LAB — BASIC METABOLIC PANEL
Anion gap: 11 (ref 5–15)
BUN: 26 mg/dL — ABNORMAL HIGH (ref 8–23)
CO2: 22 mmol/L (ref 22–32)
Calcium: 9.1 mg/dL (ref 8.9–10.3)
Chloride: 96 mmol/L — ABNORMAL LOW (ref 98–111)
Creatinine, Ser: 0.84 mg/dL (ref 0.44–1.00)
GFR calc Af Amer: >60 mL/min (ref >60)
GFR calc non Af Amer: >60 mL/min (ref >60)
Glucose, Bld: 180 mg/dL — ABNORMAL HIGH (ref 70–99)
Potassium: 4.9 mmol/L (ref 3.5–5.1)
Sodium: 129 mmol/L — ABNORMAL LOW (ref 135–145)

## 2019-05-26 LAB — CBC
HCT: 39.2 % (ref 36.0–46.0)
Hemoglobin: 12.5 g/dL (ref 12.0–15.0)
MCH: 27.4 pg (ref 26.0–34.0)
MCHC: 31.9 g/dL (ref 30.0–36.0)
MCV: 85.8 fL (ref 80.0–100.0)
Platelets: 219 10*3/uL (ref 150–400)
RBC: 4.57 MIL/uL (ref 3.87–5.11)
RDW: 14.6 % (ref 11.5–15.5)
WBC: 10 10*3/uL (ref 4.0–10.5)
nRBC: 0 % (ref 0.0–0.2)

## 2019-05-26 LAB — GLUCOSE, CAPILLARY
Glucose-Capillary: 149 mg/dL — ABNORMAL HIGH (ref 70–99)
Glucose-Capillary: 221 mg/dL — ABNORMAL HIGH (ref 70–99)
Glucose-Capillary: 193 mg/dL — ABNORMAL HIGH (ref 70–99)

## 2019-05-26 LAB — PROTIME-INR
INR: 2 — ABNORMAL HIGH (ref 0.8–1.2)
Prothrombin Time: 22.6 s — ABNORMAL HIGH (ref 11.4–15.2)

## 2019-05-26 MED ORDER — BOOST / RESOURCE BREEZE PO LIQD CUSTOM
1.0000 | Freq: Three times a day (TID) | ORAL | Status: DC
  Administered 2019-05-26 – 2019-06-03 (×20): 1 via ORAL

## 2019-05-26 MED ORDER — INSULIN ASPART 100 UNIT/ML ~~LOC~~ SOLN
0.0000 [IU] | Freq: Three times a day (TID) | SUBCUTANEOUS | Status: DC
  Administered 2019-05-26: 3 [IU] via SUBCUTANEOUS
  Administered 2019-05-26: 13:00:00 5 [IU] via SUBCUTANEOUS
  Administered 2019-05-27: 17:00:00 2 [IU] via SUBCUTANEOUS
  Administered 2019-05-27: 09:00:00 8 [IU] via SUBCUTANEOUS
  Administered 2019-05-27: 13:00:00 5 [IU] via SUBCUTANEOUS
  Administered 2019-05-28 – 2019-05-29 (×5): 2 [IU] via SUBCUTANEOUS
  Administered 2019-05-30: 17:00:00 3 [IU] via SUBCUTANEOUS
  Administered 2019-05-30: 09:00:00 2 [IU] via SUBCUTANEOUS
  Administered 2019-05-31 (×2): 3 [IU] via SUBCUTANEOUS
  Administered 2019-05-31: 09:00:00 2 [IU] via SUBCUTANEOUS
  Administered 2019-06-01: 5 [IU] via SUBCUTANEOUS
  Administered 2019-06-01 – 2019-06-02 (×2): 3 [IU] via SUBCUTANEOUS
  Administered 2019-06-02: 07:00:00 2 [IU] via SUBCUTANEOUS
  Administered 2019-06-02: 12:00:00 3 [IU] via SUBCUTANEOUS
  Administered 2019-06-03: 8 [IU] via SUBCUTANEOUS
  Administered 2019-06-03: 08:00:00 2 [IU] via SUBCUTANEOUS

## 2019-05-26 MED ORDER — SODIUM CHLORIDE 1 G PO TABS
1.0000 g | ORAL_TABLET | Freq: Three times a day (TID) | ORAL | Status: DC
  Administered 2019-05-26 – 2019-05-27 (×4): 1 g via ORAL
  Filled 2019-05-26 (×8): qty 1

## 2019-05-26 MED ORDER — POTASSIUM CHLORIDE CRYS ER 20 MEQ PO TBCR: 20.0000 meq | EXTENDED_RELEASE_TABLET | Freq: Every day | ORAL | Status: DC

## 2019-05-26 MED ORDER — MAGNESIUM CITRATE PO SOLN
1.0000 | Freq: Once | ORAL | Status: DC
  Filled 2019-05-26 (×2): qty 296

## 2019-05-26 MED ORDER — POLYETHYLENE GLYCOL 3350 17 G PO PACK
17.0000 g | PACK | Freq: Every day | ORAL | Status: DC
  Administered 2019-05-26 – 2019-06-03 (×8): 17 g via ORAL
  Filled 2019-05-26 (×8): qty 1

## 2019-05-26 MED ORDER — MORPHINE SULFATE (PF) 2 MG/ML IV SOLN: 1.0000 mg | Freq: Four times a day (QID) | INTRAVENOUS | Status: DC | PRN

## 2019-05-26 MED ORDER — MORPHINE SULFATE (PF) 2 MG/ML IV SOLN: 2.0000 mg | Freq: Four times a day (QID) | INTRAVENOUS | Status: DC | PRN

## 2019-05-26 NOTE — Consult Note (Addendum)
Cardiology Consultation:   Patient ID: [name] [name] MRN: [id number]; DOB: Oct 28, 1929  Admit date: 05/24/2019 Date of Consult: 05/26/2019  Primary Care Provider: Raelene Bott, MD Primary Cardiologist: [name] Rouge, MD  Primary Electrophysiologist:  [name] Grayer, MD    Patient Profile:   [name] [name] is a 86 y.o. female with a hx of persistent Afib, failed Tikosyn, DM2,  HTN, subclavian stenosis on the right, HLD, orthostatic hypotension, unsteady gait, CVA, PVD, AS s/p TAVR 2018 at [facility], cardiomyopathy EF 45%, mechanical fall 03/2016 with fractured pelvis who is being seen today for the evaluation of pre-op evaluation at the request of Trauma.  History of Present Illness:   [name] [name] was previously followed by [name] [name] but more recently has been following regularly with the Afib clinic for chronic management. History includes Heart cath in 2005 and 2018 showing no CAD, echo in 2011 with EF 60%, myocardial perfusion study 2009 and 2012 with overall normal results and no ischemia with normal EF. She reported being diagnosed with Afib in 2006 post op following L knee surgery. And was started on coumadin. She has undergone multiple cardioversions and failed amiodarone, flecanide and Tikosyn. Patient continued to follow with [name] [name] and the Afib clinic over the years. Echo in 2016 showed EF 65-70%, normal wall motion, mild to mod AI mean gradient 13mmHg, mild to mod AS, mod MR, LA severely dilated, mild TR. Echo in 2017 with no significant change. Patient had an ER visit in [place] for chest pain and VT and was transferred to [facility] where they found her AS had worsened and she had L/R heart cath with no obstructive disease and subsequently underwent TAVR 10/2016. The patient was last seen 05/2017 in the Afib clinic and was overall doing better. She was rate controlled in Afib and not interested in DCCV at the time and was continued on amiodarone. She since then the patient resumed care at [facility].  It appears she has had issues with orthostatic hypotension encouraged to wear compression stockings. Amlodipine stopped and lisinopril continued. Patient also underwent DCCV in 10/2017 and noted to have bradycardia with pauses on cardiac event monitor (08/2018) and metoprolol was stopped improving rate. Echo in 07/2017 showed normal bioprosthetic valve function, EF 45%, mod MR.   The patient presented to the ED 05/25/19 for a mechanical fall the day prior. She said she turned around in her kitched while using her walker and tripped over one of the wheels and fell on her bottom. She hit her head lightly on the floor. No LOC. Denied chest pain, dizziness, lightheadedness prior to fall. She pressed her life alert button and crawled over to the phone. The patient was originally taken to an urgent care in [place] which showed left wrist fx and she was encouraged to go to ED for further evaluation. Initial X-rays were concerning for possible hip fx. CT chest/abdomen/pelvis showed acute compression fractures int he thoracic and lumbar spine as well as a sternal fracture. No hip fx noted.  Trauma surgery was consulted and patient was admitted. Hand surgery consulted.   Past Medical History:  Diagnosis Date  . Arthritis   . Atrial fibrillation (HCC)    persistent, coumadin per PCP;  echo 06/06/09: EF 60%, mild LVH, Gr 1 diast dysfx, AV gradient mean 9, mod MR, mod LAE;  Myoview 11/09: normal, EF 81%  . Back pain   . Cancer ([place])   . Complication of anesthesia   . Coronary artery disease   .  CVA (cerebral vascular accident) ([place])    2006  . Depression   . Disc disorder    STEROID INJECTION 12/09/16  . Dyspnea    DOE  . Dysrhythmia   . Fatigue   . Giant cell arteritis [facility])    sees dr. at [facility]  . H/O: hysterectomy   . Heart murmur   . HTN (hypertension)   . Hypercholesteremia    Secondary to vascular disease  . PONV (postoperative nausea and vomiting)    DID FINE WITH CATARACT SURGERY  . Subclavian  artery stenosis, right (HCC)    dopplers done 2/12;  carotids 2/12: 0-39% bilateral  . TIA (transient ischemic attack)     Past Surgical History:  Procedure Laterality Date  . ABDOMINAL HYSTERECTOMY    . APPENDECTOMY    . CARDIAC VALVE REPLACEMENT     08/28/16  . CARDIOVERSION N/A 08/18/2015   Procedure: CARDIOVERSION;  Surgeon: [name] Spark, MD;  Location: [place];  Service: Cardiovascular;  Laterality: N/A;  . CATARACT EXTRACTION W/PHACO Left 03/10/2017   Procedure: CATARACT EXTRACTION PHACO AND INTRAOCULAR LENS PLACEMENT ([place]);  Surgeon: [name] Koyanagi, MD;  Location: [facility] ORS;  Service: Ophthalmology;  Laterality: Left;  US01:16.5 AP%21.1 [id number] FLUID LOT # [id number]  . CATARACT EXTRACTION W/PHACO Right 04/16/2017   Procedure: CATARACT EXTRACTION PHACO AND INTRAOCULAR LENS PLACEMENT ([facility]);  Surgeon: [name] Koyanagi, MD;  Location: [facility] ORS;  Service: Ophthalmology;  Laterality: Right;  [place] 01:13 AP% 20.3 CDE 14.93 Fluid pack lot # [id number]  . CORONARY ANGIOPLASTY     STENT 08/21/16  . KNEE SURGERY  3/06   Left  . MOHS SURGERY    . PROLAPSED UTERINE FIBROID LIGATION  7/05     Home Medications:  Prior to Admission medications   Medication Sig Start Date End Date Taking? Authorizing Provider  acetaminophen (TYLENOL) 650 MG CR tablet Take 1,300 mg by mouth in the morning and at bedtime.   Yes [provider]  amiodarone (PACERONE) 200 MG tablet TAKE 1/2 TABLET BY MOUTH DAILY Patient taking differently: Take 200 mg by mouth daily.  08/26/17  Yes [name] Needs, NP  aspirin 81 MG tablet Take 81 mg by mouth daily.   Yes [provider]  atorvastatin (LIPITOR) 10 MG tablet Take 10 mg by mouth See admin instructions. Every other night   Yes [provider]  azelastine (ASTELIN) 0.1 % nasal spray Place 1-2 sprays into both nostrils 2 (two) times daily as needed for allergies. 05/06/19  Yes [provider]  budesonide (ENTOCORT EC) 3  MG 24 hr capsule Take 6 mg by mouth daily. 03/17/19  Yes [provider]  DULoxetine (CYMBALTA) 30 MG capsule Take 30 mg by mouth daily.   Yes [provider]  KLOR-CON M20 20 MEQ tablet Take 20 mEq by mouth daily. 05/19/19  Yes [provider]  lisinopril (ZESTRIL) 5 MG tablet Take 5 mg by mouth daily.   Yes [provider]  mirtazapine (REMERON) 15 MG tablet Take 7.5 mg by mouth at bedtime.   Yes [provider]  Probiotic Product (ALIGN) 4 MG CAPS Take 4 mg by mouth daily.   Yes [provider]  TRESIBA FLEXTOUCH 100 UNIT/ML SOPN FlexTouch Pen Inject 7 Units into the skin daily.  03/18/17  Yes [provider]  Vitamin D, Ergocalciferol, (DRISDOL) 1.25 MG (50000 UNIT) CAPS capsule Take 50,000 Units by mouth once a week. 05/06/19  Yes [provider]  warfarin (COUMADIN) 5 MG  tablet Take 2.5 mg by mouth daily.    Yes [provider]    Inpatient Medications: Scheduled Meds: . acetaminophen  1,000 mg Oral Q6H  . amiodarone  200 mg Oral Daily  . atorvastatin  10 mg Oral QODAY  . budesonide  6 mg Oral Daily  . docusate sodium  100 mg Oral BID  . DULoxetine  30 mg Oral Daily  . feeding supplement  1 Container Oral TID BM  . insulin aspart  0-15 Units Subcutaneous TID WC  . insulin degludec  7 Units Subcutaneous Daily  . lidocaine  10 mL Infiltration Once  . lisinopril  5 mg Oral Daily  . magnesium citrate  1 Bottle Oral Once  . methocarbamol  1,000 mg Oral Q8H  . mirtazapine  7.5 mg Oral QHS  . polyethylene glycol  17 g Oral Daily  . [START ON 05/29/2019] potassium chloride SA  20 mEq Oral Daily  . sodium chloride  1 g Oral TID WC  . [START ON 05/27/2019] Vitamin D (Ergocalciferol)  50,000 Units Oral Weekly  . warfarin  2.5 mg Oral Daily  . Warfarin - Physician Dosing Inpatient   Does not apply q1800   Continuous Infusions:  PRN Meds: bisacodyl, morphine injection, ondansetron **OR** ondansetron (ZOFRAN) IV,  traMADol  Allergies:    Allergies  Allergen Reactions  . Ciprofloxacin Other (See Comments)    tachycardia  . Escitalopram Nausea Only and Other (See Comments)    "lips puffed", "tight in chest", "nasuea", "dizzy"  . Ranitidine Diarrhea    Social History:   Social History   Socioeconomic History  . Marital status: Widowed    Spouse name: Not on file  . Number of children: Not on file  . Years of education: Not on file  . Highest education level: Not on file  Occupational History  . Not on file  Tobacco Use  . Smoking status: Never Smoker  . Smokeless tobacco: Never Used  Substance and Sexual Activity  . Alcohol use: No  . Drug use: No  . Sexual activity: Not on file  Other Topics Concern  . Not on file  Social History Narrative   She is happily married. Her husband was with her today. She lives between [place] and [place].  Retired Advertising account planner.   Social Determinants of Health   Financial Resource Strain:   . Difficulty of Paying Living Expenses:   Food Insecurity:   . Worried About Charity fundraiser in the Last Year:   . Arboriculturist in the Last Year:   Transportation [name]:   . Film/video editor (Medical):   Marland Kitchen Lack of Transportation (Non-Medical):   Physical Activity:   . Days of Exercise per Week:   . Minutes of Exercise per Session:   Stress:   . Feeling of Stress :   Social Connections:   . Frequency of Communication with Friends and Family:   . Frequency of Social Gatherings with Friends and Family:   . Attends Religious Services:   . Active Member of Clubs or Organizations:   . Attends Archivist Meetings:   Marland Kitchen Marital Status:   Intimate Partner Violence:   . Fear of Current or Ex-Partner:   . Emotionally Abused:   Marland Kitchen Physically Abused:   . Sexually Abused:     Family History:   Family History  Problem Relation Age of Onset  . Hypertension Daughter   . Hypertension Other   . Stroke  Neg Hx   . Diabetes type II Neg Hx       ROS:  Please see the history of present illness.  All other ROS reviewed and negative.     Physical Exam/Data:   Vitals:   05/26/19 0752 05/26/19 1120 05/26/19 1124 05/26/19 1335  BP: 104/79 100/68  104/61  Pulse: 61 66 66 (!) 57  Resp: 19 (!) 25 20 18   Temp: 97.6 F (36.4 C)     TempSrc: Oral     SpO2: 97% 95% 98% 98%  Weight:      Height:        Intake/Output Summary (Last 24 hours) at 05/26/2019 1514 Last data filed at 05/26/2019 1200 Gross per 24 hour  Intake 130 ml  Output --  Net 130 ml   Last 3 Weights 05/24/2019 05/20/2017 04/16/2017  Weight (lbs) 90 lb 122 lb 117 lb  Weight (kg) 40.824 kg 55.339 kg 53.071 kg     Body mass index is 17.58 kg/m.  General:  Well nourished, well developed, in no acute distress HEENT: normal Lymph: no adenopathy Neck: no JVD Endocrine:  No thryomegaly Vascular: No carotid bruits; FA pulses 2+ bilaterally without bruits  Cardiac:  normal S1, S2; bradycardic; no murmur  Lungs:  clear to auscultation bilaterally, no wheezing, rhonchi or rales  Abd: soft, nontender, no hepatomegaly  Ext: Trace edema Musculoskeletal:  No deformities, BUE and BLE strength normal and equal Skin: warm and dry  Neuro:  CNs 2-12 intact, no focal abnormalities noted Psych:  Normal affect   EKG:  The EKG was personally reviewed and demonstrates:  Junctional bradycardia, 52 bpm Telemetry:  Telemetry was personally reviewed and demonstrates:  N/A  Relevant CV Studies:  In Care everywhere Echo Aug 06, 2017  Mildly decreased left ventricular systolic function, ejection fraction 45%  S/p transcatheter aortic valve replacement (23 mm Sapien S3 TAVR prosthesis implanted 08/27/2016). Valve is well seated without paravalvular leak. The velocities across the valve are normal and consistent with a well functioning bioprosthetic valve.  Mitral regurgitation - moderate  Mitral annular calcification  Dilated left atrium - severe  Tricuspid regurgitation -  moderate  Elevated pulmonary artery systolic pressure - moderate  Segmental wall motion abnormality - (anterior)    Echo 07/2015 Study Conclusions   - Left ventricle: The cavity size was normal. There was mild focal  basal hypertrophy of the septum. Systolic function was normal.  The estimated ejection fraction was in the range of 55% to 60%.  Wall motion was normal; there were no regional wall motion  abnormalities. The study is not technically sufficient to allow  evaluation of LV diastolic function.  - Aortic valve: Valve mobility was restricted. There was very mild  stenosis. There was mild regurgitation. Peak velocity (S): 241  cm/s. Mean gradient (S): 13 mm Hg.  - Mitral valve: Moderately calcified annulus. There was mild to  moderate regurgitation directed posteriorly.  - Left atrium: The atrium was mildly dilated.   Laboratory Data:  High Sensitivity Troponin:   Recent Labs  Lab 05/25/19 0940  TROPONINIHS 31*     Chemistry Recent Labs  Lab 05/24/19 2256 05/26/19 0236  NA 136 129*  K 4.9 4.9  CL 104 96*  CO2 19* 22  GLUCOSE 145* 180*  BUN 30* 26*  CREATININE 0.83 0.84  CALCIUM 9.5 9.1  GFRNONAA >60 >60  GFRAA >60 >60  ANIONGAP 13 11    Recent Labs  Lab 05/24/19 2256  PROT 7.3  ALBUMIN 4.1  AST 55*  ALT 43  ALKPHOS 151*  BILITOT 0.9   Hematology Recent Labs  Lab 05/24/19 2256 05/26/19 0236  WBC 14.0* 10.0  RBC 5.04 4.57  HGB 13.9 12.5  HCT 44.9 39.2  MCV 89.1 85.8  MCH 27.6 27.4  MCHC 31.0 31.9  RDW 15.0 14.6  PLT 316 219   BNPNo results for input(s): BNP, PROBNP in the last 168 hours.  DDimer No results for input(s): DDIMER in the last 168 hours.   Radiology/Studies:  DG Wrist 2 Views Left  Result Date: 05/24/2019 CLINICAL DATA:  86 year old female status post reduction of the distal left radial fracture. EXAM: LEFT WRIST - 2 VIEW COMPARISON:  Earlier radiograph dated 05/24/2019. FINDINGS: Dorsally displaced and  angulated, comminuted intra-articular fracture of the distal radius with no significant interval change in the degree of displacement or angulation since the earlier radiograph. There has been interval placement of overlying cast. IMPRESSION: Interval placement of a cast. No significant change in the degree of displacement of the distal radial fracture. Electronically Signed   By: Anner Crete M.D.   On: 05/24/2019 23:55   DG Wrist Complete Left  Result Date: 05/24/2019 CLINICAL DATA:  Fall, left wrist deformity EXAM: LEFT WRIST - COMPLETE 3+ VIEW COMPARISON:  None. FINDINGS: Dorsally angulated, impacted and mildly comminuted fracture the left radial metaphysis with extension into the distal radioulnar joint and likely extension to the radiocarpal articular surface along the medial aspect. There is some dorsal rotation of the lunate relative to the capitate in an alignment suspicious for dorsal intercalated segmental instability (DISI) findings on a background moderate arthrosis in the wrist most pronounced at the first carpometacarpal and triscaphe joints. Circumferential soft tissue swelling is noted. Extensive vascular calcium in the soft tissues. IMPRESSION: Dorsally angulated, impacted, and mildly comminuted fracture of the left radial metaphysis with extension into the distal radioulnar joint and likely the radiocarpal articular surface. Irregular appearance of the capitolunate angle, suspicious for a dorsal instability. Circumferential swelling. The osseous structures appear diffusely demineralized which may limit detection of small or nondisplaced fractures. Electronically Signed   By: Lovena Le M.D.   On: 05/24/2019 23:14   CT Head Wo Contrast  Result Date: 05/24/2019 CLINICAL DATA:  Anticoagulated patient with minor head trauma from falling and hitting her head. Chronic neck pain. EXAM: CT HEAD WITHOUT CONTRAST CT CERVICAL SPINE WITHOUT CONTRAST TECHNIQUE: Multidetector CT imaging of the  head and cervical spine was performed following the standard protocol without intravenous contrast. Multiplanar CT image reconstructions of the cervical spine were also generated. COMPARISON:  None. February 16, 2018 imaging reports. FINDINGS: CT HEAD FINDINGS Brain: No acute intracranial hemorrhage, mass effect, midline shift, or abnormal extra-axial fluid collection. Extensive bilateral corona radiata white matter disease, likely microvascular ischemic changes. Chronic appearing lacunar infarction in the left insular ribbon. Diffuse mild cerebral atrophy and proportionate mild ventriculomegaly. Vascular: Intracranial calcified atherosclerosis of the anterior and posterior circulation. Skull: No acute fracture lucency. Diffuse demineralization. Benign calvarial hemangioma in the right occipital bone. Sinuses/Orbits: Mild bilateral ethmoidal and maxillary mucosal thickening. No acute air-fluid levels. Other: Chronic appearing bilateral nasal bone fractures with deviation of the tip of the nose towards the left. Small amount of cerumen in the right external auditory canal. Torus palatini. CT CERVICAL SPINE FINDINGS Alignment: Mild grade 1 retrolistheses of C4 on C5, C5 on C6, and C6 on C7, likely degenerative. Skull base and vertebrae: Mild degenerative anterior wedging of C4 and C5. No cervical  compression fracture or fracture lucency. Normal cervico cranial alignment. Soft tissues and spinal canal: No prevertebral fluid or swelling. No visible canal hematoma. Possible severe atherosclerotic narrowing of the left internal carotid artery origin. Disc levels:  No posttraumatic widening or displacement. Upper chest: Aorta calcified atherosclerosis. Benign coarsely calcified granuloma in the left upper lobe. Vertebrobasilar calcified atherosclerosis. Incidental imaging of a 10 mm peripherally calcified and additional smaller nodules in the thyroid gland; no imaging follow-up is indicated according to the ACR criteria.  Other: Muscle atrophy and cachexia. IMPRESSION: No acute intracranial hemorrhage or edema. No acute bony injury to the cervical spine. Bilateral corona radiata white matter microvascular ischemic changes and chronic left lacunar infarction with diffuse mild cerebral atrophy. Atherosclerotic calcification of the anterior and posterior intracranial circulation. Moderate degenerative changes of the cervical spine with grade 1 retrolistheses from C4-C7 that are likely degenerative. Possible severe calcified atherosclerotic narrowing the right internal carotid artery origin which could be further evaluated with CT angiography of the neck as clinically appropriate. Thoracic aortic calcified atherosclerosis. Benign pulmonary calcified granuloma in the left lung apex. Diffuse bone demineralization. Electronically Signed   By: Revonda Humphrey   On: 05/24/2019 23:56   CT Chest W Contrast  Result Date: 05/25/2019 CLINICAL DATA:  86 year old female with abdominal trauma. EXAM: CT CHEST, ABDOMEN, AND PELVIS WITH CONTRAST TECHNIQUE: Multidetector CT imaging of the chest, abdomen and pelvis was performed following the standard protocol during bolus administration of intravenous contrast. CONTRAST:  130mL OMNIPAQUE IOHEXOL 300 MG/ML  SOLN COMPARISON:  Chest radiograph dated 05/24/2019 and CT dated 06/17/2015 FINDINGS: CT CHEST FINDINGS Cardiovascular: There is mild dilatation of the left atrium. Advanced calcification of the mitral annulus likely resulting in mitral stenosis. There is no pericardial effusion. Aortic valve repair. Top-normal caliber ascending aorta measuring 4 cm in axial diameter. There is advanced atherosclerotic calcification of the aorta. No dissection. The origins of the great vessels of the aortic arch appear patent. The central pulmonary arteries appear unremarkable. Mediastinum/Nodes: There is no hilar or mediastinal adenopathy. The esophagus is grossly unremarkable. Bilateral thyroid nodules noted. No  mediastinal fluid collection or hematoma. Lungs/Pleura: There are bibasilar subpleural atelectasis/scarring. No focal consolidation, pleural effusion, or pneumothorax. The central airways are patent. Musculoskeletal: There is advanced osteopenia and extensive degenerative changes of the spine. T7 compression fracture with approximately greater than 50% loss of vertebral body height and anterior wedging, likely acute. Correlation with clinical exam and point tenderness recommended. No retropulsed fragment. There is focal sclerotic area with faint linear lucency in the superior aspect of the sternal body at the articulation with the manubrium which is also concerning for a nondisplaced acute fracture. Old-appearing right eleventh and twelfth rib fractures. There is chronic displaced fracture of the right humeral neck. CT ABDOMEN PELVIS FINDINGS No intra-abdominal free air or free fluid. Hepatobiliary: Mild irregularity of the liver contour may represent early changes of cirrhosis. No intrahepatic biliary ductal dilatation. The gallbladder is unremarkable. Pancreas: Unremarkable. No pancreatic ductal dilatation or surrounding inflammatory changes. Spleen: Normal in size without focal abnormality. Adrenals/Urinary Tract: The adrenal glands are unremarkable. There is a 3 cm right renal interpolar cyst. There is no hydronephrosis on either side. There is symmetric enhancement and excretion of contrast by both kidneys. The visualized ureters and urinary bladder appear unremarkable. Stomach/Bowel: There is large amount of stool throughout the colon. There is no bowel obstruction or active inflammation. Appendectomy. Vascular/Lymphatic: Advanced aortoiliac atherosclerotic disease. The IVC is unremarkable. No portal venous gas. There is no  adenopathy. Reproductive: Hysterectomy. No adnexal masses. Other: None Musculoskeletal: Osteopenia with severe multilevel degenerative changes and multilevel disc desiccation and vacuum  phenomena. Old healed left pubic bone as well as right inferior pubic ramus fractures. Old L1 compression fracture with anterior wedging similar to prior CT. There is new compression fractures of the superior endplate of L5 and inferior endplate of L4. The L5 superior endplate appears acute or subacute. Clinical correlation recommended. No retropulsed fragment. IMPRESSION: 1. Advanced osteopenia with multilevel compression fractures as described. The T7, L4 and L5 compression fractures appear new since the prior studies and concerning for acute or subacute fractures. Correlation with point tenderness recommended. No retropulsed fragment. 2. Age indeterminate, possibly acute nondisplaced fracture of the superior is sternal body. 3. No other acute/traumatic intrathoracic, abdominal, or pelvic pathology. 4. Constipation. No bowel obstruction. 5. Aortic Atherosclerosis (ICD10-I70.0). Electronically Signed   By: Anner Crete M.D.   On: 05/25/2019 01:29   CT Cervical Spine Wo Contrast  Result Date: 05/24/2019 CLINICAL DATA:  Anticoagulated patient with minor head trauma from falling and hitting her head. Chronic neck pain. EXAM: CT HEAD WITHOUT CONTRAST CT CERVICAL SPINE WITHOUT CONTRAST TECHNIQUE: Multidetector CT imaging of the head and cervical spine was performed following the standard protocol without intravenous contrast. Multiplanar CT image reconstructions of the cervical spine were also generated. COMPARISON:  None. February 16, 2018 imaging reports. FINDINGS: CT HEAD FINDINGS Brain: No acute intracranial hemorrhage, mass effect, midline shift, or abnormal extra-axial fluid collection. Extensive bilateral corona radiata white matter disease, likely microvascular ischemic changes. Chronic appearing lacunar infarction in the left insular ribbon. Diffuse mild cerebral atrophy and proportionate mild ventriculomegaly. Vascular: Intracranial calcified atherosclerosis of the anterior and posterior circulation.  Skull: No acute fracture lucency. Diffuse demineralization. Benign calvarial hemangioma in the right occipital bone. Sinuses/Orbits: Mild bilateral ethmoidal and maxillary mucosal thickening. No acute air-fluid levels. Other: Chronic appearing bilateral nasal bone fractures with deviation of the tip of the nose towards the left. Small amount of cerumen in the right external auditory canal. Torus palatini. CT CERVICAL SPINE FINDINGS Alignment: Mild grade 1 retrolistheses of C4 on C5, C5 on C6, and C6 on C7, likely degenerative. Skull base and vertebrae: Mild degenerative anterior wedging of C4 and C5. No cervical compression fracture or fracture lucency. Normal cervico cranial alignment. Soft tissues and spinal canal: No prevertebral fluid or swelling. No visible canal hematoma. Possible severe atherosclerotic narrowing of the left internal carotid artery origin. Disc levels:  No posttraumatic widening or displacement. Upper chest: Aorta calcified atherosclerosis. Benign coarsely calcified granuloma in the left upper lobe. Vertebrobasilar calcified atherosclerosis. Incidental imaging of a 10 mm peripherally calcified and additional smaller nodules in the thyroid gland; no imaging follow-up is indicated according to the ACR criteria. Other: Muscle atrophy and cachexia. IMPRESSION: No acute intracranial hemorrhage or edema. No acute bony injury to the cervical spine. Bilateral corona radiata white matter microvascular ischemic changes and chronic left lacunar infarction with diffuse mild cerebral atrophy. Atherosclerotic calcification of the anterior and posterior intracranial circulation. Moderate degenerative changes of the cervical spine with grade 1 retrolistheses from C4-C7 that are likely degenerative. Possible severe calcified atherosclerotic narrowing the right internal carotid artery origin which could be further evaluated with CT angiography of the neck as clinically appropriate. Thoracic aortic calcified  atherosclerosis. Benign pulmonary calcified granuloma in the left lung apex. Diffuse bone demineralization. Electronically Signed   By: Revonda Humphrey   On: 05/24/2019 23:56   CT ABDOMEN PELVIS W CONTRAST  Result  Date: 05/25/2019 CLINICAL DATA:  86 year old female with abdominal trauma. EXAM: CT CHEST, ABDOMEN, AND PELVIS WITH CONTRAST TECHNIQUE: Multidetector CT imaging of the chest, abdomen and pelvis was performed following the standard protocol during bolus administration of intravenous contrast. CONTRAST:  163mL OMNIPAQUE IOHEXOL 300 MG/ML  SOLN COMPARISON:  Chest radiograph dated 05/24/2019 and CT dated 06/17/2015 FINDINGS: CT CHEST FINDINGS Cardiovascular: There is mild dilatation of the left atrium. Advanced calcification of the mitral annulus likely resulting in mitral stenosis. There is no pericardial effusion. Aortic valve repair. Top-normal caliber ascending aorta measuring 4 cm in axial diameter. There is advanced atherosclerotic calcification of the aorta. No dissection. The origins of the great vessels of the aortic arch appear patent. The central pulmonary arteries appear unremarkable. Mediastinum/Nodes: There is no hilar or mediastinal adenopathy. The esophagus is grossly unremarkable. Bilateral thyroid nodules noted. No mediastinal fluid collection or hematoma. Lungs/Pleura: There are bibasilar subpleural atelectasis/scarring. No focal consolidation, pleural effusion, or pneumothorax. The central airways are patent. Musculoskeletal: There is advanced osteopenia and extensive degenerative changes of the spine. T7 compression fracture with approximately greater than 50% loss of vertebral body height and anterior wedging, likely acute. Correlation with clinical exam and point tenderness recommended. No retropulsed fragment. There is focal sclerotic area with faint linear lucency in the superior aspect of the sternal body at the articulation with the manubrium which is also concerning for a  nondisplaced acute fracture. Old-appearing right eleventh and twelfth rib fractures. There is chronic displaced fracture of the right humeral neck. CT ABDOMEN PELVIS FINDINGS No intra-abdominal free air or free fluid. Hepatobiliary: Mild irregularity of the liver contour may represent early changes of cirrhosis. No intrahepatic biliary ductal dilatation. The gallbladder is unremarkable. Pancreas: Unremarkable. No pancreatic ductal dilatation or surrounding inflammatory changes. Spleen: Normal in size without focal abnormality. Adrenals/Urinary Tract: The adrenal glands are unremarkable. There is a 3 cm right renal interpolar cyst. There is no hydronephrosis on either side. There is symmetric enhancement and excretion of contrast by both kidneys. The visualized ureters and urinary bladder appear unremarkable. Stomach/Bowel: There is large amount of stool throughout the colon. There is no bowel obstruction or active inflammation. Appendectomy. Vascular/Lymphatic: Advanced aortoiliac atherosclerotic disease. The IVC is unremarkable. No portal venous gas. There is no adenopathy. Reproductive: Hysterectomy. No adnexal masses. Other: None Musculoskeletal: Osteopenia with severe multilevel degenerative changes and multilevel disc desiccation and vacuum phenomena. Old healed left pubic bone as well as right inferior pubic ramus fractures. Old L1 compression fracture with anterior wedging similar to prior CT. There is new compression fractures of the superior endplate of L5 and inferior endplate of L4. The L5 superior endplate appears acute or subacute. Clinical correlation recommended. No retropulsed fragment. IMPRESSION: 1. Advanced osteopenia with multilevel compression fractures as described. The T7, L4 and L5 compression fractures appear new since the prior studies and concerning for acute or subacute fractures. Correlation with point tenderness recommended. No retropulsed fragment. 2. Age indeterminate, possibly acute  nondisplaced fracture of the superior is sternal body. 3. No other acute/traumatic intrathoracic, abdominal, or pelvic pathology. 4. Constipation. No bowel obstruction. 5. Aortic Atherosclerosis (ICD10-I70.0). Electronically Signed   By: Anner Crete M.D.   On: 05/25/2019 01:29   DG Pelvis Portable  Result Date: 05/24/2019 CLINICAL DATA:  Fall, level 2 trauma EXAM: PORTABLE PELVIS 1-2 VIEWS COMPARISON:  Radiograph Jul 29, 2018 FINDINGS: Cortical step-off at the right femoral head neck junction is suspicious for a varus angulated subcapital femoral neck fracture. There is a subacute fracture  of the right inferior pubic ramus with additional irregularity at the right inferior pubic body which is also likely subacute in nature. Stable remote deformity of the left pubic root and inferior ramus. Severe degenerative changes in the left hip. More moderate degenerative change in the right hip. Multilevel degenerative changes are present in the imaged portions of the spine. Additional sclerotic features in the SI joints without diastatic widening of the SI or symphysis pubis. Extensive vascular calcium noted in the pelvis and medial thighs as well as within the abdominal aorta. Bowel gas pattern is unremarkable. IMPRESSION: 1. Probable varus angulated subcapital right femoral neck fracture. Femoral heads remain located. 2. Subacute fractures of the right inferior pubic ramus and right inferior pubic body. 3. Severe left and moderate right hip osteoarthritis. 4. Bones are diffusely demineralized which may limit detection of subtle or nondisplaced fractures. Electronically Signed   By: Lovena Le M.D.   On: 05/24/2019 23:10   DG Chest Port 1 View  Result Date: 05/24/2019 CLINICAL DATA:  Fall today, level 2 trauma EXAM: PORTABLE CHEST 1 VIEW COMPARISON:  Radiograph 03/31/2019, CT 06/17/2015 FINDINGS: Chronically coarsened interstitial changes in the lungs with some bandlike areas of scarring and/or atelectasis in  the left mid lung and left lung base. Stable positioning of an aortic valve stent repair. Extensive mitral annular calcifications are present. No consolidation, features of edema, pneumothorax, or effusion. Chronic deformity of the right proximal humerus. Degenerative changes are present in the imaged spine and shoulders. IMPRESSION: Bandlike areas of atelectasis or scarring in the left lung base. No acute cardiopulmonary abnormality. Chronic deformity of the proximal right humerus. Electronically Signed   By: Lovena Le M.D.   On: 05/24/2019 23:07      Assessment and Plan:   Pre-op evaluation for possible repair of L distal radial fx Patient presented after mechanical fall. Imaging showed, sternal fx, T7/L4/L5 compression fxs, L distal radius fracture. - Neurosurgery saw for thoracic and lumbar fxs and felt no surgical intervention was needed. Recommended lumbar corset brace with follow-up.  - L distal radial fx plan for possible operative repair - Cardiac h/o includes long history of persistent Afib, AS s/p TAVR 2018, mildly decreased LV function 45%, HTN, and HLD.  - Afib with multiple failed cardioversions, Tikosyn, and Flecainide. Has been maintained on amiodarone. EKG on admission showed sinus with old LBBB, QtC 501 ms. EKG today appears to show a junctional rhythm, 53 bpm.  - H/o of AS s/p TAVR at [facility] in 2018. In care every where echo 07/2017 showed EF 45%, stable valve prosthesis with normal function. - Patient has 2 previous cardiac caths, most recent 2018 showing nonobstructive CAD. Is on Aspirin and statin. Patient says she often has MSK chest pain if she is up and about.  - the patient lives alone and is not very functional at baseline. She uses a walker to get around. She has caregivers who come in daily who help with housework. She is able to do only light housework. Her son or daughter stay with the patient at night.  - METS < 4. Patient is not very functional at baseline - According  to the revised cardiac risk index patinet is Class II risk, 6% for 30 days risk of death, MI, or cardiac arrest. Likely OK to proceed with surgery, especially since wrist sx is low risk. Will need to continue to monitor heart rate.   Persistent Afib - s/p multiple failed DCCV, Tikosyn and flecainide.  - Last seen  in the Afib clinic 05/2017. Patient follows with [facility] and has been maintained on amiodarone 200 mg daily.   - CHADSVASC 7 (agex2, female, stroke x 2, HTN, DM2) This patients CHA2DS2-VASc Score and unadjusted Ischemic Stroke Rate (% per year) is equal to 11.2 % stroke rate/year from a score of 7 Above score calculated as 1 point each if present [CHF, HTN, DM, Vascular=MI/PAD/Aortic Plaque, Age if 65-74, or Female] Above score calculated as 2 points each if present [Age > 75, or Stroke/TIA/TE] - coumadin for stroke prophylaxis. INR 2.0.  - EKG on admission with sinus bradycardia. Continue to monitor heart rate  Bradycardia/LBBB - LBBB chronic, seen on previous EKGs - bradycardia noted to be an issue addressed by [facility] cards in the past. She had an event monitor showing bradycardia with pauses and her BB was continued which seemed to resolve the issue.  - EKG today with possible junctional rhythm. Not on telemetry. Would continue to monitor for now. Patient is asymptomatic.   Aortic Stenosis - s/p TAVR at [facility] - Echo in 07/2017 with stable valve and normal function. Will repeat echo  HTN - is on lisinopril 5 mg daily at home - pressures initially elevated, now soft  HLD - is on Atorvastatin 10mg  daily - no recent LDL on file  For questions or updates, please contact [place] Please consult [email or web address] for contact info under     Signed, Cadence Ninfa Meeker, PA-C  05/26/2019 3:14 PM   I have examined the patient and reviewed assessment and plan and discussed with patient.  Agree with above as stated.  Preop eval for possible wrist surgery. From a cardiac standpoint she had a  cath in 2018 without significant disease.  I personally reviewed the echo images and she has normal LVEF with LVH.  Wrist surgery is relatively low risk.  No further cardiac testing needed before surgery.  Of note, she has an LBBB.  She was seen to have a juntional rhythm with LBBB transiently, but no syncope or dizziness.  WOuld have to watch for any sx.  Given age, TAVR, LBBB,  And occasional junction al rhythm, she has a high likelihood of needing pacer in the future.   Larae Grooms

## 2019-05-26 NOTE — Evaluation (Signed)
Occupational Therapy Evaluation Patient Details Name: [name] [name] MRN: [id number] DOB: [birth year] Today's Date: 05/26/2019    History of Present Illness Pt is 86 yo female admitted s/p fall.  Pt with T7/L4/L5 compression fx with no retropulsion managed with corset LSO when OOB, L distal radius fx with splint and NWB (f/u with ortho outpt), and sternal fracture.  Pt with PMH including arthritis, afib, back pain, CA, CAD, CVA depression, HTN, HCL, cardiac valve replacement 6/18.  Pt with 4-5 falls in last 6 months per H and P.   Clinical Impression   Pt PTA: Pt living alone with caregiver support and family support. Pt currently with limitations due to ?sternal fx and LUE splinted; pt with decreased activity tolerance, poor mobility and decreased ability to safely care for self. Pt modA overall with for stand pivot and for power up. Pt limited by NWB status and discomfort of sternal fxs,. Pt minA for UB ADL and modA for LB ADL. Pt fatigues very easily;pt only ambulating short household distances. Pt would benefit from continued OT skilled services for ADL, mobility and safety. OT following acutely.     Follow Up Recommendations  SNF;Supervision/Assistance - 24 hour    Equipment Recommendations  Other (comment)(to be determined)    Recommendations for Other Services       Precautions / Restrictions Precautions Precautions: Fall;Back Precaution Booklet Issued: No Precaution Comments: verbal discussion of back precautions Required Braces or Orthoses: Splint/Cast;Spinal Brace Spinal Brace: Lumbar corset;Applied in sitting position Splint/Cast: LUE casted below elbow to MCPs Restrictions Weight Bearing Restrictions: Yes LUE Weight Bearing: Non weight bearing      Mobility Bed Mobility Overal bed mobility: Needs Assistance Bed Mobility: Supine to Sit     Supine to sit: Mod assist;HOB elevated Sit to supine: Mod assist;HOB elevated   General bed mobility comments: Cues for log  roll and NWB L hand; assist to elevate trunk for supine/sit and for legs with sit/supine  Transfers Overall transfer level: Needs assistance Equipment used: Rolling walker (2 wheeled)(pt lightly grasping with RUE mostly a very short distance) Transfers: Sit to/from [employer] Sit to Stand: Mod assist Stand pivot transfers: Mod assist      Lateral/Scoot Transfers: Mod assist General transfer comment: Pt modA overall with for stand pivot and for power up. Pt limited by  NWB status and discomfort of sternal fxs,.    Balance Overall balance assessment: Needs assistance;History of Falls Sitting-balance support: No upper extremity supported;Feet supported Sitting balance-Leahy Scale: Good     Standing balance support: Single extremity supported Standing balance-Leahy Scale: Poor Standing balance comment: pt leaning forward due to discomfort- trial of platform RW next ession                           ADL either performed or assessed with clinical judgement   ADL Overall ADL's : Needs assistance/impaired Eating/Feeding: Set up;Sitting   Grooming: Set up;Cueing for safety;Sitting Grooming Details (indicate cue type and reason): pt sat in recliner at sink Upper Body Bathing: Moderate assistance   Lower Body Bathing: Moderate assistance;Cueing for safety;Cueing for sequencing;Sitting/lateral leans;Sit to/from stand   Upper Body Dressing : Moderate assistance;Sitting   Lower Body Dressing: Moderate assistance;Sitting/lateral leans;Sit to/from stand;Cueing for safety   Toilet Transfer: Moderate assistance;Cueing for safety;Ambulation;RW   Toileting- Clothing Manipulation and Hygiene: Maximal assistance;Cueing for safety;Sitting/lateral lean;Sit to/from stand       Functional mobility during ADLs: Minimal assistance;Cueing for safety;Cueing for sequencing;Rolling  walker General ADL Comments: Pt with limitations due to ?sternal fx and LUE splinted; pt with  decreased activity tolerance, poor mobility and decreased ability to safely care for self.     Vision Baseline Vision/History: No visual deficits Vision Assessment?: No apparent visual deficits     Perception     Praxis      Pertinent Vitals/Pain Pain Assessment: Faces Faces Pain Scale: Hurts little more Pain Location: L arm Pain Descriptors / Indicators: Discomfort;Grimacing;Sore Pain Intervention(s): Monitored during session;RN gave pain meds during session     Hand Dominance Right   Extremity/Trunk Assessment Upper Extremity Assessment Upper Extremity Assessment: Generalized weakness;RUE deficits/detail;LUE deficits/detail RUE Deficits / Details: chronic R humeral fx, shoulder FF limited to 45*; elbow through digits, WFLs RUE Coordination: decreased fine motor;decreased gross motor LUE Deficits / Details: mild dorsal displaced radius fx, splinted elbow to MCPs; wiggling digits; shoulder FF 80*, elbow flex, AROM, WFLs. LUE: Unable to fully assess due to immobilization LUE Coordination: decreased gross motor;decreased fine motor   Lower Extremity Assessment Lower Extremity Assessment: Defer to PT evaluation RLE Deficits / Details: ROM WFL; MMT ankle DF 5/5, knee ext 5/5, hip flex 3/5 (not further tested due to compression fx in spine) RLE Sensation: WNL LLE Deficits / Details: ROM WFL; MMT ankle DF 5/5, knee ext 5/5, hip flex 3/5 (not further tested due to compression fx in spine LLE Sensation: WNL   Cervical / Trunk Assessment Cervical / Trunk Assessment: Normal   Communication Communication Communication: HOH   Cognition Arousal/Alertness: Awake/alert Behavior During Therapy: WFL for tasks assessed/performed Overall Cognitive Status: Impaired/Different from baseline Area of Impairment: Following commands                       Following Commands: Follows one step commands with increased time;Follows one step commands inconsistently       General  Comments: Pt following commands; pt often talking about unrelated topic due to [facility] I assume.   General Comments  Pt became nauseated and vomited once sitting EOB.  Unable to ambulate.    Pt and daughter educated on back precautions and NWB L UE/hand.  Discussed recommendation for SNF.    Exercises     Shoulder Instructions      Home Living Family/patient expects to be discharged to:: Private residence Living Arrangements: Alone Available Help at Discharge: Friend(s);Family;Available PRN/intermittently(caregiver during day intermittently; family at night; not 24) Type of Home: House Home Access: Stairs to enter [employer] of Steps: 1 Entrance Stairs-Rails: Can reach both Home Layout: Two level Alternate Level Stairs-Number of Steps: 5 Alternate Level Stairs-Rails: (stair lift) Bathroom Shower/Tub: Occupational psychologist: Handicapped height     Home Equipment: Cane - single point;Walker - 4 wheels;Shower seat          Prior Functioning/Environment Level of Independence: Needs assistance  Gait / Transfers Assistance Needed: walking independently short community distances ADL's / Homemaking Assistance Needed: caregivers during the day; son/CG  each night for supervision; family provides assist on weekends.            OT Problem List: Decreased strength;Decreased activity tolerance;Impaired balance (sitting and/or standing);Decreased coordination;Decreased safety awareness;Impaired UE functional use;Pain;Increased edema      OT Treatment/Interventions: Self-care/ADL training;Therapeutic exercise;Energy conservation;DME and/or AE instruction;Therapeutic activities;Patient/family education;Balance training;Cognitive remediation/compensation    OT Goals(Current goals can be found in the care plan section) Acute Rehab OT Goals Patient Stated Goal: to increase independence OT Goal Formulation: With patient Time For  Goal Achievement: 06/09/19 Potential to  Achieve Goals: Good ADL Goals Pt Will Perform Lower Body Dressing: with min assist;sit to/from stand Pt Will Transfer to Toilet: with min guard assist;ambulating Pt Will Perform Toileting - Clothing Manipulation and hygiene: with min assist;sit to/from stand Pt/caregiver will Perform Home Exercise Program: Increased strength;With Supervision Additional ADL Goal #1: Pt will increase to supervisionA for ADL with good safety awareness.  OT Frequency: Min 2X/week   Barriers to D/C:            Co-evaluation              AM-PAC OT "6 Clicks" Daily Activity     Outcome Measure Help from another person eating meals?: A Little Help from another person taking care of personal grooming?: A Little Help from another person toileting, which includes using toliet, bedpan, or urinal?: A Lot Help from another person bathing (including washing, rinsing, drying)?: A Lot Help from another person to put on and taking off regular upper body clothing?: A Little Help from another person to put on and taking off regular lower body clothing?: A Lot 6 Click Score: 15   End of Session Equipment Utilized During Treatment: Gait belt;Rolling walker;Back brace Nurse Communication: Mobility status  Activity Tolerance: Patient limited by pain;Patient limited by fatigue Patient left: in chair;with call bell/phone within reach;with chair alarm set  OT Visit Diagnosis: Unsteadiness on feet (R26.81);Muscle weakness (generalized) (M62.81);Pain Pain - Right/Left: Left Pain - part of body: Arm                Time: 1008-1101 OT Time Calculation (min): 53 min Charges:  OT General Charges $OT Visit: 1 Visit OT Evaluation $OT Eval Moderate Complexity: 1 Mod OT Treatments $Self Care/Home Management : 23-37 mins $Therapeutic Activity: 8-22 mins  Jefferey Pica, OTR/L Acute Rehabilitation Services Pager: [phone] Office: [phone]   Dwain Huhn C 05/26/2019, 4:20 PM

## 2019-05-26 NOTE — Progress Notes (Signed)
ST elevated on cardiac monitor in the morning. Checked EKG, but no changed from previous one, no ischemia.  Patient was confused during at night and in the morning. She didn't remember where she was and what happened to her. Patient's daughter called asked that pt's condition, gave updating. Notified trauma team PA regarding holding morning dose of potassium  due to K+ 4.9 & lisinopril, and sodium was 129 today, HR high 40's to 50's while sleeping. Patient had multiple fall at home, pt's taking coumadin due to A-fib. Question about this medication since patient has multiple falls. Patient is living by herself even though she had helper at home, but not 24 hours watch. She is not safe to send home. PT assessed patient today. Pt's daughter came to see patient and hospital doesn't carry [place] medication. Asked daughter to bring tomorrow. Informed daughter patient will transfer to 6N med-surg unit. Gave report to [facility] nurse. HS [employer]

## 2019-05-26 NOTE — Evaluation (Signed)
Physical Therapy Evaluation Patient Details Name: [name] [name] MRN: [id number] DOB: May 23, 1929 Today's Date: 05/26/2019   History of Present Illness  Pt is 86 yo female admitted s/p fall.  Pt with T7/L4/L5 compression fx with no retropulsion managed with corset LSO when OOB, L distal radius fx with splint and NWB (f/u with ortho outpt), and sternal fracture.  Pt with PMH including arthritis, afib, back pain, CA, CAD, CVA depression, HTN, HCL, cardiac valve replacement 6/18.  Pt with 4-5 falls in last 6 months per H and P.  Clinical Impression  Pt admitted with above diagnosis. PT evaluation was limited by nausea/vomiting.  Pt required mod A for transfers, partial sit to stand, and lateral scoot toward HOB.  Required verbal and tactile cues for back precautions and NWB L UE.  Unable to ambulate due to nausea.  Would likely benefit from platform walker when progressing ambulation.  Pt requiring assist with transfers and has had history of multiple falls.  Pt currently with functional limitations due to the deficits listed below (see PT Problem List). Pt will benefit from skilled PT to increase their independence and safety with mobility to allow discharge to the venue listed below.       Follow Up Recommendations SNF    Equipment Recommendations  Other (comment)(defer to next venu)    Recommendations for Other Services       Precautions / Restrictions Precautions Precautions: Fall;Back Precaution Booklet Issued: No Precaution Comments: verbal discussion of back precautions Required Braces or Orthoses: Splint/Cast;Spinal Brace Spinal Brace: Lumbar corset;Applied in sitting position Splint/Cast: LUE casted below elbow to MCPs Restrictions Weight Bearing Restrictions: Yes LUE Weight Bearing: Non weight bearing      Mobility  Bed Mobility Overal bed mobility: Needs Assistance Bed Mobility: Supine to Sit;Sit to Supine     Supine to sit: Mod assist;HOB elevated Sit to supine: Mod  assist;HOB elevated   General bed mobility comments: Cued for log roll and NWB L hand; assist to elevate trunk for supine/sit and for legs with sit/supine  Transfers Overall transfer level: Needs assistance Equipment used: Left platform walker Transfers: Sit to/from Stand;Lateral/Scoot Transfers Sit to Stand: Mod assist        Lateral/Scoot Transfers: Mod assist General transfer comment: Attempted sit to stand with RW but pt limited due to nausea and only achieved partial sit to stand.  Cued for safe hand placement.  Pt did lateral scoot toward HOB with mod A and cue for safe hand placement.  Ambulation/Gait             General Gait Details: defered due to n/v  Stairs            Wheelchair Mobility    Modified Rankin (Stroke Patients Only)       Balance Overall balance assessment: Needs assistance;History of Falls Sitting-balance support: No upper extremity supported;Feet supported Sitting balance-Leahy Scale: Good         Standing balance comment: defered- but likely decreased due to hx of falls                             Pertinent Vitals/Pain Pain Assessment: No/denies pain    Home Living Family/patient expects to be discharged to:: Private residence Living Arrangements: Alone Available Help at Discharge: Friend(s);Family;Available PRN/intermittently(caregiver during day intermittently; family at night; not 24 hr day) Type of Home: House Home Access: Stairs to enter Entrance Stairs-Rails: Can reach both Entrance Stairs-Number of Steps:  1 Home Layout: Two level Home Equipment: Cane - single point;Walker - 4 wheels;Shower seat      Prior Function Level of Independence: Needs assistance   Gait / Transfers Assistance Needed: walking independently short community distances  ADL's / Homemaking Assistance Needed: caregivers during the day; son/CG  each night for supervision; family provides assist on weekends.        Hand Dominance    Dominant Hand: Right    Extremity/Trunk Assessment   Upper Extremity Assessment Upper Extremity Assessment: Defer to OT evaluation    Lower Extremity Assessment Lower Extremity Assessment: LLE deficits/detail;RLE deficits/detail RLE Deficits / Details: ROM WFL; MMT ankle DF 5/5, knee ext 5/5, hip flex 3/5 (not further tested due to compression fx in spine) RLE Sensation: WNL LLE Deficits / Details: ROM WFL; MMT ankle DF 5/5, knee ext 5/5, hip flex 3/5 (not further tested due to compression fx in spine LLE Sensation: WNL    Cervical / Trunk Assessment Cervical / Trunk Assessment: Normal  Communication   Communication: HOH  Cognition Arousal/Alertness: Awake/alert Behavior During Therapy: WFL for tasks assessed/performed Overall Cognitive Status: Impaired/Different from baseline Area of Impairment: Following commands                       Following Commands: Follows one step commands with increased time;Follows one step commands inconsistently(likely limited due to [facility])       General Comments: Pt was alert and oriented x 4, but daughter reports intermittent confusion.  Additionally, pt with confused statements when discussing d/c plans ; however, may be related to [place] comments (skin integrity, edema, etc.): Pt became nauseated and vomited once sitting EOB.  Unable to ambulate.    Pt and daughter educated on back precautions and NWB L UE/hand.  Discussed recommendation for SNF.    Exercises     Assessment/Plan    PT Assessment Patient needs continued PT services  PT Problem List Decreased strength;Decreased mobility;Decreased safety awareness;Decreased range of motion;Decreased coordination;Decreased knowledge of precautions;Decreased activity tolerance;Decreased balance;Decreased knowledge of use of DME       PT Treatment Interventions DME instruction;Therapeutic activities;Gait training;Therapeutic exercise;Patient/family  education;Balance training;Functional mobility training    PT Goals (Current goals can be found in the Care Plan section)  Acute Rehab PT Goals Patient Stated Goal: open to SNF at d/c PT Goal Formulation: With patient/family Time For Goal Achievement: 06/09/19 Potential to Achieve Goals: Good    Frequency Min 2X/week   Barriers to discharge Decreased caregiver support      Co-evaluation               AM-PAC PT "6 Clicks" Mobility  Outcome Measure Help needed turning from your back to your side while in a flat bed without using bedrails?: A Lot Help needed moving from lying on your back to sitting on the side of a flat bed without using bedrails?: A Lot Help needed moving to and from a bed to a chair (including a wheelchair)?: A Lot Help needed standing up from a chair using your arms (e.g., wheelchair or bedside chair)?: A Lot Help needed to walk in hospital room?: A Lot Help needed climbing 3-5 steps with a railing? : A Lot 6 Click Score: 12    End of Session Equipment Utilized During Treatment: Gait belt;Back brace Activity Tolerance: Other (comment)(limited due to nausea) Patient left: in bed;with call bell/phone within reach;with bed alarm set;with family/visitor present Nurse Communication:  Mobility status(nausea) PT Visit Diagnosis: Unsteadiness on feet (R26.81);History of falling (Z91.81);Muscle weakness (generalized) (M62.81)    Time: 1435-1500 PT Time Calculation (min) (ACUTE ONLY): 25 min   Charges:   PT Evaluation $PT Eval Moderate Complexity: 1 Mod          Maggie Font, PT Acute Rehab Services Pager [phone] [facility] [phone] [facility] [phone]   Karlton Lemon 05/26/2019, 3:22 PM

## 2019-05-26 NOTE — Progress Notes (Signed)
  Echocardiogram 2D Echocardiogram has been performed.  Darlina Sicilian M 05/26/2019, 4:37 PM

## 2019-05-26 NOTE — Progress Notes (Addendum)
Trauma/Critical Care Follow Up Note  Subjective:    Overnight Issues: NAEON, constipated  Objective:  Vital signs for last 24 hours: Temp:  [97 F (36.1 C)-98.7 F (37.1 C)] 97.6 F (36.4 C) (03/17 0752) Pulse Rate:  [55-79] 61 (03/17 0752) Resp:  [15-22] 19 (03/17 0752) BP: (104-153)/(70-99) 104/79 (03/17 0752) SpO2:  [91 %-97 %] 97 % (03/17 0752)  Hemodynamic parameters for last 24 hours:    Intake/Output from previous day: No intake/output data recorded.  Intake/Output this shift: No intake/output data recorded.  Vent settings for last 24 hours:    Physical Exam:  Gen: comfortable, no distress Neuro: non-focal exam HEENT: PERRL Neck: supple CV: RRR Pulm: unlabored breathing Abd: soft, NT, lumbar corset in place GU: spont voids Extr: wwp, no edema   Results for orders placed or performed during the hospital encounter of 05/24/19 (from the past 24 hour(s))  Urinalysis, Routine w reflex microscopic     Status: Abnormal   Collection Time: 05/25/19 12:23 PM  Result Value Ref Range   Color, Urine YELLOW YELLOW   APPearance HAZY (A) CLEAR   Specific Gravity, Urine >1.046 (H) 1.005 - 1.030   pH 5.0 5.0 - 8.0   Glucose, UA NEGATIVE NEGATIVE mg/dL   Hgb urine dipstick NEGATIVE NEGATIVE   Bilirubin Urine NEGATIVE NEGATIVE   Ketones, ur NEGATIVE NEGATIVE mg/dL   Protein, ur 100 (A) NEGATIVE mg/dL   Nitrite NEGATIVE NEGATIVE   Leukocytes,Ua NEGATIVE NEGATIVE   RBC / HPF 0-5 0 - 5 RBC/hpf   WBC, UA 0-5 0 - 5 WBC/hpf   Bacteria, UA RARE (A) NONE SEEN   Squamous Epithelial / LPF 0-5 0 - 5  CBG monitoring, ED     Status: Abnormal   Collection Time: 05/25/19  5:05 PM  Result Value Ref Range   Glucose-Capillary 131 (H) 70 - 99 mg/dL  Glucose, capillary     Status: Abnormal   Collection Time: 05/25/19  7:49 PM  Result Value Ref Range   Glucose-Capillary 260 (H) 70 - 99 mg/dL  CBC     Status: None   Collection Time: 05/26/19  2:36 AM  Result Value Ref Range   WBC 10.0 4.0 - 10.5 K/uL   RBC 4.57 3.87 - 5.11 MIL/uL   Hemoglobin 12.5 12.0 - 15.0 g/dL   HCT 39.2 36.0 - 46.0 %   MCV 85.8 80.0 - 100.0 fL   MCH 27.4 26.0 - 34.0 pg   MCHC 31.9 30.0 - 36.0 g/dL   RDW 14.6 11.5 - 15.5 %   Platelets 219 150 - 400 K/uL   nRBC 0.0 0.0 - 0.2 %  Basic metabolic panel     Status: Abnormal   Collection Time: 05/26/19  2:36 AM  Result Value Ref Range   Sodium 129 (L) 135 - 145 mmol/L   Potassium 4.9 3.5 - 5.1 mmol/L   Chloride 96 (L) 98 - 111 mmol/L   CO2 22 22 - 32 mmol/L   Glucose, Bld 180 (H) 70 - 99 mg/dL   BUN 26 (H) 8 - 23 mg/dL   Creatinine, Ser 0.84 0.44 - 1.00 mg/dL   Calcium 9.1 8.9 - 10.3 mg/dL   GFR calc non Af Amer >60 >60 mL/min   GFR calc Af Amer >60 >60 mL/min   Anion gap 11 5 - 15  Glucose, capillary     Status: Abnormal   Collection Time: 05/26/19  6:45 AM  Result Value Ref Range   Glucose-Capillary 149 (H)  70 - 99 mg/dL   Comment 1 Notify RN    Comment 2 Document in Chart     Assessment & Plan:  Present on Admission: . Compressed spine fracture ([place])    LOS: 1 day   Additional comments:I reviewed the patient's new clinical lab test results.    84F s/p mechanical GLF  Sternal fracture - continue cardiac monitoring T7/L4/L5 compression fractures - NSGY c/s (Dr. Ronnald Ramp), lumbar corset as management L distal radius fracture - Hand surgery c/s (Dr. Lenon Curt), discussed that patient is functionally LHD after prior fall with R humerus fx managed non-op and patient walks with a walker. Possible operative repair pending cardiology eval for risk stratification. Will d/w family.  FEN - regular diet, SSI, escalate bowel regimen DVT - SCDs, continue home warfarin Dispo - floor  Update provided to daughter. All questions answered. Encouraged daughter to bring food from home/patient's favorite restaurant to encourage PO intake and also to bring patient's hearing aids from home to aid in delirium prevention.    Jesusita Oka,  MD Trauma & General Surgery Please use [email or web address] to contact on call provider  05/26/2019  *Care during the described time interval was provided by me. I have reviewed this patient's available data, including medical history, events of note, physical examination and test results as part of my evaluation.

## 2019-05-27 ENCOUNTER — Other Ambulatory Visit: Payer: Self-pay

## 2019-05-27 DIAGNOSIS — L8990 Pressure ulcer of unspecified site, unspecified stage: Secondary | ICD-10-CM | POA: Insufficient documentation

## 2019-05-27 DIAGNOSIS — I4821 Permanent atrial fibrillation: Secondary | ICD-10-CM

## 2019-05-27 LAB — BASIC METABOLIC PANEL
Anion gap: 12 (ref 5–15)
BUN: 26 mg/dL — ABNORMAL HIGH (ref 8–23)
CO2: 21 mmol/L — ABNORMAL LOW (ref 22–32)
Calcium: 8.5 mg/dL — ABNORMAL LOW (ref 8.9–10.3)
Chloride: 90 mmol/L — ABNORMAL LOW (ref 98–111)
Creatinine, Ser: 0.8 mg/dL (ref 0.44–1.00)
GFR calc Af Amer: >60 mL/min (ref >60)
GFR calc non Af Amer: >60 mL/min (ref >60)
Glucose, Bld: 151 mg/dL — ABNORMAL HIGH (ref 70–99)
Potassium: 4.9 mmol/L (ref 3.5–5.1)
Sodium: 123 mmol/L — ABNORMAL LOW (ref 135–145)

## 2019-05-27 LAB — URINE CULTURE: Culture: >=100000 — AB

## 2019-05-27 LAB — CBC
HCT: 39 % (ref 36.0–46.0)
Hemoglobin: 12.2 g/dL (ref 12.0–15.0)
MCH: 27.5 pg (ref 26.0–34.0)
MCHC: 31.3 g/dL (ref 30.0–36.0)
MCV: 87.8 fL (ref 80.0–100.0)
Platelets: 239 10*3/uL (ref 150–400)
RBC: 4.44 MIL/uL (ref 3.87–5.11)
RDW: 14.6 % (ref 11.5–15.5)
WBC: 9.3 10*3/uL (ref 4.0–10.5)
nRBC: 0 % (ref 0.0–0.2)

## 2019-05-27 LAB — SURGICAL PCR SCREEN
MRSA, PCR: NEGATIVE
Staphylococcus aureus: NEGATIVE

## 2019-05-27 LAB — PROTIME-INR
INR: 2.3 — ABNORMAL HIGH (ref 0.8–1.2)
INR: 1.7 — ABNORMAL HIGH (ref 0.8–1.2)
Prothrombin Time: 25.3 s — ABNORMAL HIGH (ref 11.4–15.2)
Prothrombin Time: 19.6 seconds — ABNORMAL HIGH (ref 11.4–15.2)

## 2019-05-27 LAB — ECHOCARDIOGRAM COMPLETE
Height: 60 in
Weight: 1440 oz

## 2019-05-27 LAB — GLUCOSE, CAPILLARY
Glucose-Capillary: 277 mg/dL — ABNORMAL HIGH (ref 70–99)
Glucose-Capillary: 204 mg/dL — ABNORMAL HIGH (ref 70–99)
Glucose-Capillary: 137 mg/dL — ABNORMAL HIGH (ref 70–99)
Glucose-Capillary: 139 mg/dL — ABNORMAL HIGH (ref 70–99)

## 2019-05-27 MED ORDER — INSULIN GLARGINE 100 UNIT/ML ~~LOC~~ SOLN
7.0000 [IU] | Freq: Every day | SUBCUTANEOUS | Status: DC
  Administered 2019-05-27 – 2019-06-03 (×7): 7 [IU] via SUBCUTANEOUS
  Filled 2019-05-27 (×9): qty 0.07

## 2019-05-27 MED ORDER — SODIUM CHLORIDE 0.9 % IV BOLUS
500.0000 mL | Freq: Once | INTRAVENOUS | Status: AC
  Administered 2019-05-27: 500 mL via INTRAVENOUS

## 2019-05-27 MED ORDER — ADULT MULTIVITAMIN W/MINERALS CH
1.0000 | ORAL_TABLET | Freq: Every day | ORAL | Status: DC
  Administered 2019-05-29 – 2019-06-03 (×6): 1 via ORAL
  Filled 2019-05-27 (×6): qty 1

## 2019-05-27 MED ORDER — VITAMIN K1 10 MG/ML IJ SOLN
5.0000 mg | Freq: Once | INTRAVENOUS | Status: AC
  Administered 2019-05-27: 18:00:00 5 mg via INTRAVENOUS
  Filled 2019-05-27: qty 0.5

## 2019-05-27 MED ORDER — SODIUM CHLORIDE 0.9 % IV SOLN: INTRAVENOUS | Status: DC

## 2019-05-27 MED ORDER — VITAMIN K1 10 MG/ML IJ SOLN
5.0000 mg | Freq: Once | INTRAVENOUS | Status: AC
  Administered 2019-05-27: 12:00:00 5 mg via INTRAVENOUS
  Filled 2019-05-27: qty 0.5

## 2019-05-27 NOTE — Progress Notes (Addendum)
Progress Note  Patient Name: [name] [name] Date of Encounter: 05/27/2019  Primary Cardiologist: Jenkins Rouge, MD   Subjective   Doing well this morning.   Inpatient Medications    Scheduled Meds: . acetaminophen  1,000 mg Oral Q6H  . amiodarone  200 mg Oral Daily  . atorvastatin  10 mg Oral QODAY  . budesonide  6 mg Oral Daily  . docusate sodium  100 mg Oral BID  . DULoxetine  30 mg Oral Daily  . feeding supplement  1 Container Oral TID BM  . insulin aspart  0-15 Units Subcutaneous TID WC  . insulin glargine  7 Units Subcutaneous Daily  . lidocaine  10 mL Infiltration Once  . lisinopril  5 mg Oral Daily  . magnesium citrate  1 Bottle Oral Once  . methocarbamol  1,000 mg Oral Q8H  . mirtazapine  7.5 mg Oral QHS  . polyethylene glycol  17 g Oral Daily  . [START ON 05/29/2019] potassium chloride SA  20 mEq Oral Daily  . sodium chloride  1 g Oral TID WC  . Vitamin D (Ergocalciferol)  50,000 Units Oral Weekly   Continuous Infusions: . phytonadione (VITAMIN K) IV     PRN Meds: bisacodyl, morphine injection, ondansetron **OR** ondansetron (ZOFRAN) IV, traMADol   Vital Signs    Vitals:   05/26/19 2243 05/27/19 0050 05/27/19 0436 05/27/19 0828  BP: 107/69 113/71 (!) 150/73 113/78  Pulse: (!) 55 (!) 57 (!) 55 (!) 55  Resp: 14 16 13 17   Temp: 97.8 F (36.6 C) 97.6 F (36.4 C) 98.2 F (36.8 C) 98.2 F (36.8 C)  TempSrc: Oral Oral Oral   SpO2: 95% 91% 95% 91%  Weight:      Height:        Intake/Output Summary (Last 24 hours) at 05/27/2019 1034 Last data filed at 05/27/2019 0720 Gross per 24 hour  Intake 340 ml  Output --  Net 340 ml   Last 3 Weights 05/24/2019 05/20/2017 04/16/2017  Weight (lbs) 90 lb 122 lb 117 lb  Weight (kg) 40.824 kg 55.339 kg 53.071 kg      Telemetry    No telemetry morning  ECG    No new tracing.   Physical Exam  Pleasant older WF, sitting up in bed.  GEN: No acute distress.   Neck: No JVD Cardiac: Bradycardic, soft systolic  murmur, no rubs, or gallops.  Respiratory: Clear to auscultation bilaterally. GI: Soft, nontender, non-distended  MS: No edema; No deformity. Left arm wrapped.  Neuro:  Nonfocal  Psych: Normal affect   Labs    High Sensitivity Troponin:   Recent Labs  Lab 05/25/19 0940  TROPONINIHS 31*      Chemistry Recent Labs  Lab 05/24/19 2256 05/26/19 0236  NA 136 129*  K 4.9 4.9  CL 104 96*  CO2 19* 22  GLUCOSE 145* 180*  BUN 30* 26*  CREATININE 0.83 0.84  CALCIUM 9.5 9.1  PROT 7.3  --   ALBUMIN 4.1  --   AST 55*  --   ALT 43  --   ALKPHOS 151*  --   BILITOT 0.9  --   GFRNONAA >60 >60  GFRAA >60 >60  ANIONGAP 13 11     Hematology Recent Labs  Lab 05/24/19 2256 05/26/19 0236  WBC 14.0* 10.0  RBC 5.04 4.57  HGB 13.9 12.5  HCT 44.9 39.2  MCV 89.1 85.8  MCH 27.6 27.4  MCHC 31.0 31.9  RDW 15.0 14.6  PLT 316 219    BNPNo results for input(s): BNP, PROBNP in the last 168 hours.   DDimer No results for input(s): DDIMER in the last 168 hours.   Radiology    ECHOCARDIOGRAM COMPLETE  Result Date: 05/27/2019    ECHOCARDIOGRAM REPORT   Patient Name:   [name] [name] Date of Exam: 05/26/2019 Medical Rec #:  [id number]       Height:       60.0 in Accession #:    [id number]      Weight:       90.0 lb Date of Birth:  April 28, 1929        BSA:          1.329 m Patient Age:    86 years        BP:           104/61 mmHg Patient Gender: F               HR:           57 bpm. Exam Location:  Inpatient Procedure: 2D Echo Indications:    Aortic Stenosis 424.1 / 135.0  History:        Patient has prior history of Echocardiogram examinations, most                 recent 07/13/2015. CAD, Stroke and TIA, Arrythmias:Atrial                 Fibrillation, Signs/Symptoms:Murmur; Risk Factors:Hypertension.  Sonographer:    Darlina Sicilian RDCS Referring Phys: [id number] [place]  1. Left ventricular ejection fraction, by estimation, is 55 to 60%. The left ventricle has normal function. The  left ventricle has no regional wall motion abnormalities. There is moderate left ventricular hypertrophy. Left ventricular diastolic parameters are indeterminate.  2. Right ventricular systolic function is mildly reduced. The right ventricular size is normal. There is mildly elevated pulmonary artery systolic pressure. The estimated right ventricular systolic pressure is 123XX123 mmHg.  3. Left atrial size was severely dilated.  4. The mitral valve is abnormal. Severe leaflet thickening. Severe mitral annular calfications. Moderate mitral valve regurgitation.  5. Tricuspid valve regurgitation is moderate.  6. There is a 23 mm Edwards Sapien prosthetic, stented (TAVR) valve present in the aortic position. Aortic valve regurgitation is mild. Echo findings are consistent with normal structure and function of the aortic valve prosthesis. Mean gradient 62mmHg.  7. Aortic dilatation noted. There is dilatation of the ascending aorta measuring 40 mm.  8. The inferior vena cava is normal in size with <50% respiratory variability, suggesting right atrial pressure of 8 mmHg. FINDINGS  Left Ventricle: Left ventricular ejection fraction, by estimation, is 55 to 60%. The left ventricle has normal function. The left ventricle has no regional wall motion abnormalities. The left ventricular internal cavity size was small. There is moderate  left ventricular hypertrophy. Left ventricular diastolic parameters are indeterminate. Right Ventricle: The right ventricular size is normal. No increase in right ventricular wall thickness. Right ventricular systolic function is mildly reduced. There is mildly elevated pulmonary artery systolic pressure. The tricuspid regurgitant velocity  is 2.53 m/s, and with an assumed right atrial pressure of 8 mmHg, the estimated right ventricular systolic pressure is 123XX123 mmHg. Left Atrium: Left atrial size was severely dilated. Right Atrium: Right atrial size was normal in size. Pericardium: There is no  evidence of pericardial effusion. Presence of pericardial fat pad. Mitral Valve: The mitral valve is abnormal. There is  severe thickening of the mitral valve leaflet(s). Severe mitral annular calcification. Moderate mitral valve regurgitation. MV peak gradient, 3.7 mmHg. The mean mitral valve gradient is 1.0 mmHg. Tricuspid Valve: The tricuspid valve is normal in structure. Tricuspid valve regurgitation is moderate. Aortic Valve: The aortic valve has been repaired/replaced. Aortic valve regurgitation is mild. Aortic regurgitation PHT measures 942 msec. Aortic valve mean gradient measures 3.0 mmHg. Aortic valve peak gradient measures 6.0 mmHg. Aortic valve area, by VTI measures 2.33 cm. There is a 23 mm Edwards Sapien prosthetic, stented (TAVR) valve present in the aortic position. Echo findings are consistent with normal structure and function of the aortic valve prosthesis. Pulmonic Valve: The pulmonic valve was not well visualized. Pulmonic valve regurgitation is not visualized. Aorta: Aortic dilatation noted. There is dilatation of the ascending aorta measuring 40 mm. Venous: The inferior vena cava is normal in size with less than 50% respiratory variability, suggesting right atrial pressure of 8 mmHg. IAS/Shunts: The interatrial septum was not well visualized.  LEFT VENTRICLE PLAX 2D LVIDd:         2.90 cm LVIDs:         2.20 cm LV PW:         1.40 cm LV IVS:        1.50 cm LVOT diam:     2.20 cm LV SV:         44 LV SV Index:   33 LVOT Area:     3.80 cm  RIGHT VENTRICLE RV S prime:     7.95 cm/s LEFT ATRIUM              Index       RIGHT ATRIUM           Index LA diam:        4.70 cm  3.54 cm/m  RA Area:     12.60 cm LA Vol (A2C):   83.2 ml  62.58 ml/m RA Volume:   25.00 ml  18.80 ml/m LA Vol (A4C):   117.0 ml 88.01 ml/m LA Biplane Vol: 98.0 ml  73.72 ml/m  AORTIC VALVE AV Area (Vmax):    2.46 cm AV Area (Vmean):   2.74 cm AV Area (VTI):     2.33 cm AV Vmax:           122.00 cm/s AV Vmean:           75.400 cm/s AV VTI:            0.189 m AV Peak Grad:      6.0 mmHg AV Mean Grad:      3.0 mmHg LVOT Vmax:         79.00 cm/s LVOT Vmean:        54.300 cm/s LVOT VTI:          0.116 m LVOT/AV VTI ratio: 0.61 AI PHT:            942 msec  AORTA Ao Root diam: 2.20 cm Ao Asc diam:  4.00 cm MITRAL VALVE               TRICUSPID VALVE MV Area (PHT): 2.94 cm    TR Peak grad:   25.6 mmHg MV Peak grad:  3.7 mmHg    TR Vmax:        253.00 cm/s MV Mean grad:  1.0 mmHg MV Vmax:       0.97 m/s    SHUNTS MV Vmean:      42.2  cm/s   Systemic VTI:  0.12 m MV Decel Time: 258 msec    Systemic Diam: 2.20 cm MR PISA:        1.01 cm MR PISA Radius: 0.40 cm MV E velocity: 91.10 cm/s Oswaldo Milian MD Electronically signed by Oswaldo Milian MD Signature Date/Time: 05/27/2019/2:11:20 AM    Final     Cardiac Studies   Echo 05/26/19:   IMPRESSIONS    1. Left ventricular ejection fraction, by estimation, is 55 to 60%. The  left ventricle has normal function. The left ventricle has no regional  wall motion abnormalities. There is moderate left ventricular hypertrophy.  Left ventricular diastolic  parameters are indeterminate.  2. Right ventricular systolic function is mildly reduced. The right  ventricular size is normal. There is mildly elevated pulmonary artery  systolic pressure. The estimated right ventricular systolic pressure is  123XX123 mmHg.  3. Left atrial size was severely dilated.  4. The mitral valve is abnormal. Severe leaflet thickening. Severe mitral  annular calfications. Moderate mitral valve regurgitation.  5. Tricuspid valve regurgitation is moderate.  6. There is a 23 mm Edwards Sapien prosthetic, stented (TAVR) valve  present in the aortic position. Aortic valve regurgitation is mild. Echo  findings are consistent with normal structure and function of the aortic  valve prosthesis. Mean gradient 82mmHg.  7. Aortic dilatation noted. There is dilatation of the ascending aorta  measuring  40 mm.  8. The inferior vena cava is normal in size with <50% respiratory  variability, suggesting right atrial pressure of 8 mmHg.   Patient Profile     86 y.o. female with a hx of persistent Afib, failed Tikosyn, DM2,  HTN, subclavian stenosis on the right, HLD, orthostatic hypotension, unsteady gait, CVA, PVD, AS s/p TAVR 2018 at [facility], cardiomyopathy EF 45%, mechanical fall 03/2016 with fractured pelvis who was seen for the evaluation of pre-op evaluation at the request of Trauma.  Assessment & Plan    1. Pre-op evaluation for possible repair of L distal radial fx: Patient presented after mechanical fall. Imaging showed, sternal fx, T7/L4/L5 compression fxs, L distal radius fracture. Neurosurgery saw for thoracic and lumbar fxs and felt no surgical intervention was needed. Recommended lumbar corset brace with follow-up.  - L distal radial fx plan for possible operative repair tomorrow. No plans for further cardiac evaluation  2. Persistent Afib: s/p multiple failed DCCV, Tikosyn and flecainide.  - Last seen in the Afib clinic 05/2017. Patient follows with [facility] and has been maintained on amiodarone 200 mg daily. CHADSVASC 7 (agex2, female, stroke x 2, HTN, DM2) and has been on coumadin prior to admission. INR 2.3 today with Vitamin K ordered.  - suspect she may not be a good candidate for anticoagulation moving forward if recurrent falls.   3. Bradycardia/LBBB: LBBB chronic, seen on previous EKGs - bradycardia noted to be an issue addressed by [facility] cards in the past. She had an event monitor showing bradycardia with pauses and her BB was continued which seemed to resolve the issue. With her age, TAVR and LBBB may need a PPM in the future.   4. Aortic Stenosis: s/p TAVR at [facility] - Echo this admission with normal EF, stable TAVR. Mean gradient of 68mmHg.   5. HTN: on lisinopril 5 mg daily with stable BP  6. HLD: on Atorvastatin 10mg  daily  For questions or updates, please contact [facility] Please consult [email or web address] for contact info under     Signed, Reino Bellis, NP  05/27/2019, 10:34 AM    I have examined the patient and reviewed assessment and plan and discussed with patient.  Agree with above as stated.    Feels well this morning.  I discussed the case with Saverio Danker as well.  Okay to reverse Coumadin since this appears to be for her prior atrial fibrillation.  No chest pain or shortness of breath at this time.  Her risk for surgery is mostly related to her age which obviously cannot be modified.   Longer-term, we do need to think about the safety of anticoagulation.  This is managed by Dr. Heber La Verne in [place] who has been her primary care doctor for many years.  We will try to get a message to him to get his input.  Given the recurrent falls, I would be okay with her holding off of anticoagulation at this point.  Larae Grooms

## 2019-05-27 NOTE — Plan of Care (Signed)
  Problem: Clinical Measurements: Goal: Ability to maintain clinical measurements within normal limits will improve Outcome: Progressing Goal: Will remain free from infection Outcome: Progressing Goal: Diagnostic test results will improve Outcome: Progressing Goal: Respiratory complications will improve Outcome: Progressing Goal: Cardiovascular complication will be avoided Outcome: Progressing   Problem: Activity: Goal: Risk for activity intolerance will decrease Outcome: Progressing   Problem: Nutrition: Goal: Adequate nutrition will be maintained Outcome: Progressing   Problem: Elimination: Goal: Will not experience complications related to bowel motility Outcome: Progressing Goal: Will not experience complications related to urinary retention Outcome: Progressing   Problem: Safety: Goal: Ability to remain free from injury will improve Outcome: Progressing   Problem: Skin Integrity: Goal: Risk for impaired skin integrity will decrease Outcome: Progressing   

## 2019-05-27 NOTE — Progress Notes (Signed)
Patient ID: [name] [name], female   DOB: 07-12-1929, 86 y.o.   MRN: [id number]       Subjective: Patient had a BM last night.  Ate oatmeal and applesauce this morning.  No confusion currently. Some overnight per RN.  Having some back pain, but corset up around her chest.    ROS: See above, otherwise other systems negative  Objective: Vital signs in last 24 hours: Temp:  [97.4 F (36.3 C)-98.2 F (36.8 C)] 98.2 F (36.8 C) (03/18 0436) Pulse Rate:  [55-71] 55 (03/18 0436) Resp:  [13-25] 13 (03/18 0436) BP: (93-150)/(61-73) 150/73 (03/18 0436) SpO2:  [91 %-100 %] 95 % (03/18 0436) Last BM Date: 05/26/19  Intake/Output from previous day: 03/17 0701 - 03/18 0700 In: 130 [P.O.:130] Out: -  Intake/Output this shift: No intake/output data recorded.  PE: Gen: NAD laying in bed HENT: PERRL Heart: regular, no murmur Lungs: CTAB Abd: soft, NT, ND, +BS, corset replaced to around lumbar spine Ext: Left wrist in splint, moves fingers, warm Neuro: cranial nervies II-XII grossly intact, normal sensation throughout Skin: warm and dry Psych: A&Ox3 this am  Lab Results:  Recent Labs    05/24/19 2256 05/26/19 0236  WBC 14.0* 10.0  HGB 13.9 12.5  HCT 44.9 39.2  PLT 316 219   BMET Recent Labs    05/24/19 2256 05/26/19 0236  NA 136 129*  K 4.9 4.9  CL 104 96*  CO2 19* 22  GLUCOSE 145* 180*  BUN 30* 26*  CREATININE 0.83 0.84  CALCIUM 9.5 9.1   PT/INR Recent Labs    05/26/19 1435 05/27/19 0154  LABPROT 22.6* 25.3*  INR 2.0* 2.3*   CMP     Component Value Date/Time   NA 129 (L) 05/26/2019 0236   K 4.9 05/26/2019 0236   CL 96 (L) 05/26/2019 0236   CO2 22 05/26/2019 0236   GLUCOSE 180 (H) 05/26/2019 0236   BUN 26 (H) 05/26/2019 0236   CREATININE 0.84 05/26/2019 0236   CALCIUM 9.1 05/26/2019 0236   PROT 7.3 05/24/2019 2256   ALBUMIN 4.1 05/24/2019 2256   AST 55 (H) 05/24/2019 2256   ALT 43 05/24/2019 2256   ALKPHOS 151 (H) 05/24/2019 2256   BILITOT 0.9  05/24/2019 2256   GFRNONAA >60 05/26/2019 0236   GFRAA >60 05/26/2019 0236   Lipase  No results found for: LIPASE     Studies/Results: ECHOCARDIOGRAM COMPLETE  Result Date: 05/27/2019    ECHOCARDIOGRAM REPORT   Patient Name:   [name] [name] Date of Exam: 05/26/2019 Medical Rec #:  [id number]       Height:       60.0 in Accession #:    [id number]      Weight:       90.0 lb Date of Birth:  1929/09/07        BSA:          1.329 m Patient Age:    67 years        BP:           104/61 mmHg Patient Gender: F               HR:           57 bpm. Exam Location:  Inpatient Procedure: 2D Echo Indications:    Aortic Stenosis 424.1 / 135.0  History:        Patient has prior history of Echocardiogram examinations, most  recent 07/13/2015. CAD, Stroke and TIA, Arrythmias:Atrial                 Fibrillation, Signs/Symptoms:Murmur; Risk Factors:Hypertension.  Sonographer:    Darlina Sicilian RDCS Referring Phys: [id number] [place]  1. Left ventricular ejection fraction, by estimation, is 55 to 60%. The left ventricle has normal function. The left ventricle has no regional wall motion abnormalities. There is moderate left ventricular hypertrophy. Left ventricular diastolic parameters are indeterminate.  2. Right ventricular systolic function is mildly reduced. The right ventricular size is normal. There is mildly elevated pulmonary artery systolic pressure. The estimated right ventricular systolic pressure is 123XX123 mmHg.  3. Left atrial size was severely dilated.  4. The mitral valve is abnormal. Severe leaflet thickening. Severe mitral annular calfications. Moderate mitral valve regurgitation.  5. Tricuspid valve regurgitation is moderate.  6. There is a 23 mm Edwards Sapien prosthetic, stented (TAVR) valve present in the aortic position. Aortic valve regurgitation is mild. Echo findings are consistent with normal structure and function of the aortic valve prosthesis. Mean gradient 32mmHg.  7.  Aortic dilatation noted. There is dilatation of the ascending aorta measuring 40 mm.  8. The inferior vena cava is normal in size with <50% respiratory variability, suggesting right atrial pressure of 8 mmHg. FINDINGS  Left Ventricle: Left ventricular ejection fraction, by estimation, is 55 to 60%. The left ventricle has normal function. The left ventricle has no regional wall motion abnormalities. The left ventricular internal cavity size was small. There is moderate  left ventricular hypertrophy. Left ventricular diastolic parameters are indeterminate. Right Ventricle: The right ventricular size is normal. No increase in right ventricular wall thickness. Right ventricular systolic function is mildly reduced. There is mildly elevated pulmonary artery systolic pressure. The tricuspid regurgitant velocity  is 2.53 m/s, and with an assumed right atrial pressure of 8 mmHg, the estimated right ventricular systolic pressure is 123XX123 mmHg. Left Atrium: Left atrial size was severely dilated. Right Atrium: Right atrial size was normal in size. Pericardium: There is no evidence of pericardial effusion. Presence of pericardial fat pad. Mitral Valve: The mitral valve is abnormal. There is severe thickening of the mitral valve leaflet(s). Severe mitral annular calcification. Moderate mitral valve regurgitation. MV peak gradient, 3.7 mmHg. The mean mitral valve gradient is 1.0 mmHg. Tricuspid Valve: The tricuspid valve is normal in structure. Tricuspid valve regurgitation is moderate. Aortic Valve: The aortic valve has been repaired/replaced. Aortic valve regurgitation is mild. Aortic regurgitation PHT measures 942 msec. Aortic valve mean gradient measures 3.0 mmHg. Aortic valve peak gradient measures 6.0 mmHg. Aortic valve area, by VTI measures 2.33 cm. There is a 23 mm Edwards Sapien prosthetic, stented (TAVR) valve present in the aortic position. Echo findings are consistent with normal structure and function of the aortic  valve prosthesis. Pulmonic Valve: The pulmonic valve was not well visualized. Pulmonic valve regurgitation is not visualized. Aorta: Aortic dilatation noted. There is dilatation of the ascending aorta measuring 40 mm. Venous: The inferior vena cava is normal in size with less than 50% respiratory variability, suggesting right atrial pressure of 8 mmHg. IAS/Shunts: The interatrial septum was not well visualized.  LEFT VENTRICLE PLAX 2D LVIDd:         2.90 cm LVIDs:         2.20 cm LV PW:         1.40 cm LV IVS:        1.50 cm LVOT diam:     2.20 cm  LV SV:         44 LV SV Index:   33 LVOT Area:     3.80 cm  RIGHT VENTRICLE RV S prime:     7.95 cm/s LEFT ATRIUM              Index       RIGHT ATRIUM           Index LA diam:        4.70 cm  3.54 cm/m  RA Area:     12.60 cm LA Vol (A2C):   83.2 ml  62.58 ml/m RA Volume:   25.00 ml  18.80 ml/m LA Vol (A4C):   117.0 ml 88.01 ml/m LA Biplane Vol: 98.0 ml  73.72 ml/m  AORTIC VALVE AV Area (Vmax):    2.46 cm AV Area (Vmean):   2.74 cm AV Area (VTI):     2.33 cm AV Vmax:           122.00 cm/s AV Vmean:          75.400 cm/s AV VTI:            0.189 m AV Peak Grad:      6.0 mmHg AV Mean Grad:      3.0 mmHg LVOT Vmax:         79.00 cm/s LVOT Vmean:        54.300 cm/s LVOT VTI:          0.116 m LVOT/AV VTI ratio: 0.61 AI PHT:            942 msec  AORTA Ao Root diam: 2.20 cm Ao Asc diam:  4.00 cm MITRAL VALVE               TRICUSPID VALVE MV Area (PHT): 2.94 cm    TR Peak grad:   25.6 mmHg MV Peak grad:  3.7 mmHg    TR Vmax:        253.00 cm/s MV Mean grad:  1.0 mmHg MV Vmax:       0.97 m/s    SHUNTS MV Vmean:      42.2 cm/s   Systemic VTI:  0.12 m MV Decel Time: 258 msec    Systemic Diam: 2.20 cm MR PISA:        1.01 cm MR PISA Radius: 0.40 cm MV E velocity: 91.10 cm/s Oswaldo Milian MD Electronically signed by Oswaldo Milian MD Signature Date/Time: 05/27/2019/2:11:20 AM    Final     Anti-infectives: Anti-infectives (From admission, onward)   None        Assessment/Plan   74F s/p mechanical GLF  Sternal fracture - continue cardiac monitoring T7/L4/L5 compression fractures - NSGY c/s (Dr. Ronnald Ramp), lumbar corset as management L distal radius fracture - Hand surgery c/s (Dr. Lenon Curt), discussed that patient is functionally LHD after prior fall with R humerus fx managed non-op and patient walks with a walker. Cardiology has evaluated the patient and she is felt to be low risk for wrist surgery.  Will contact Dr. Lenon Curt and make him aware and decide on when and how to reverse patient for possible OR tomorrow. A fib - currently NSR, had junctional rhythm yesterday.  Evaluated by cards and felt to be on coumadin secondary to a fib and states it is ok to reverse her.  Will try to contact EP to discuss DC of coumadin given high risk of repetitive falls H/O TAVR - doesn't need coumadin for this type of valve  per cardiology.  Ok to reverse coumadin pending timing of surgery. DM - consult diabetes coordinator as her home regimen is not available here, SSI FEN -carb mod diet, SSI, bowel regimen, likely NPO p MN for possible OR tomorrow DVT - SCDs,hold coumadin for possible OR Dispo - floor  LOS: 2 days    Henreitta Cea , [facility] 05/27/2019, 8:17 AM Please see Amion for pager number during day hours 7:00am-4:30pm or 7:00am -11:30am on weekends

## 2019-05-27 NOTE — Progress Notes (Signed)
Inpatient Diabetes Program Recommendations  AACE/ADA: New Consensus Statement on Inpatient Glycemic Control (2015)  Target Ranges:  Prepandial:   less than 140 mg/dL      Peak postprandial:   less than 180 mg/dL (1-2 hours)      Critically ill patients:  140 - 180 mg/dL   Lab Results  Component Value Date   GLUCAP 277 (H) 05/27/2019   HGBA1C 7.6 (H) 06/17/2015  Results for NILLIE, OTA (MRN [id number]) as of 05/27/2019 08:51  Ref. Range 05/25/2019 19:49 05/26/2019 06:45 05/26/2019 10:58 05/26/2019 17:12 05/27/2019 08:30  Glucose-Capillary Latest Ref Range: 70 - 99 mg/dL 260 (H) 149 (H) 221 (H) 193 (H) 277 (H)    Review of Glycemic Control  Diabetes history: type 2 Outpatient Diabetes medications: Tresiba 7 units daily Current orders for Inpatient glycemic control: Lantus 7 units daily, Novolog 0-15 units TID  Inpatient Diabetes Program Recommendations:    Received diabetes coordinator consult. Agree with Lantus dosage of 7 units daily (equal to home [place] dosage). Recommend decreasing Novolog correction scale to SENSITIVE (0-9 units) TID along with the Lantus dose.   Will continue to monitor blood sugars while in the hospital.  Harvel Ricks RN BSN CDE Diabetes Coordinator Pager: [phone]  8am-5pm

## 2019-05-27 NOTE — Progress Notes (Signed)
Initial Nutrition Assessment  DOCUMENTATION CODES:   Underweight, Non-severe (moderate) malnutrition in context of chronic illness  INTERVENTION:   -Boost Breeze po TID, each supplement provides 250 kcal and 9 grams of protein -MVI with minerals daily -Magic cup TID with meals, each supplement provides 290 kcal and 9 grams of protein  NUTRITION DIAGNOSIS:   Moderate Malnutrition related to chronic illness(CVA) as evidenced by energy intake < or equal to 75% for > or equal to 1 month, mild fat depletion, moderate fat depletion, mild muscle depletion, moderate muscle depletion.  GOAL:   Patient will meet greater than or equal to 90% of their needs  MONITOR:   PO intake, Supplement acceptance, Labs, Weight trends, Skin, I & O's  REASON FOR ASSESSMENT:   Other (Comment)    ASSESSMENT:   65F s/p mechanical GLF around 1700 on 05/24/2019. Most significant complaint is "chest pain", that she localizes to the epigastrium and states it feels the same as pain that began approximately 2 weeks ago. Reports turning around in the kitchen while using her walker and tripping over one of the wheels. Denies LOC. History of 4-5 falls in the last six months. Lives alone, but has a caregiver that comes in the day and one at night. Son stays with her 2-3 nights per week.  Pt admitted with mechanical GLF, resulting in sternal fracture, lt distal radius fracture, and T7/L4/L5 compression fractures.   Reviewed I/O's: +130 ml x 24 hours  Case discussed with RN, who reports minimal oral intake secondary to nausea (per RN, this has been all shift, with all food and medications). RN reports plan for ORIF of lt wrist tomorrow.  Spoke with pt at bedside, who reports decreased appetite secondary to nausea. This has been occurring PTA, however, pt unsure when this started; nausea is so bed that she has to skip breakfast. She generally consumes 2 meals per day, usually consisting of soup or sandwiches. Pt  daughter also reports that "it is so hard to get her to eat right, all she wants to eat is sweets". Intake has been erratic during admission; PO 10-100%.   Pt suspects she lost weight, but is unsure of UBW or when weight loss started. She estimates she has lost about 25#. Reviewed wt hx; noted pt has experienced a 26% wt loss over the past 2 years, which is concerning given advanced age and decreased oral intake.   Medications reviewed and include remeron and 0.9% sodium chloride infusion @ 75 ml/hr.   Discussed importance of good meal and supplement intake for post-operative healing.   Lab Results  Component Value Date   HGBA1C 7.6 (H) 06/17/2015   PTA DM medications are 7 units tresiba daily. Per ADA's Standards of Medical Care of Diabetes, glycemic targets for older adults who have multiple co-morbidities, cognitive impairments, and functional dependence should be less stringent (Hgb A1c <8.0-8.5).   Labs reviewed: Na: 123, CBGS: 137-227 (inpatient orders for glycemic control are 0-15 units insulin aspart TID with meals and 7 units insulin glargine daily).   NUTRITION - FOCUSED PHYSICAL EXAM:    Most Recent Value  Orbital Region  No depletion  Upper Arm Region  Mild depletion  Thoracic and Lumbar Region  Mild depletion  Buccal Region  Mild depletion  Temple Region  Mild depletion  Clavicle Bone Region  Moderate depletion  Clavicle and Acromion Bone Region  Moderate depletion  Scapular Bone Region  Moderate depletion  Dorsal Hand  Mild depletion  Patellar Region  Mild depletion  Anterior Thigh Region  Mild depletion  Posterior Calf Region  Mild depletion  Edema (RD Assessment)  Mild  Hair  Reviewed  Eyes  Reviewed  Mouth  Reviewed  Skin  Reviewed  Nails  Reviewed       Diet Order:   Diet Order            Diet NPO time specified  Diet effective midnight        Diet Carb Modified Fluid consistency: Thin; Room service appropriate? Yes  Diet effective now               EDUCATION NEEDS:   Education needs have been addressed  Skin:  Skin Assessment: Skin Integrity Issues: Skin Integrity Issues:: Stage I Stage I: sacrum  Last BM:  05/26/19  Height:   Ht Readings from Last 1 Encounters:  05/24/19 5' (1.524 m)    Weight:   Wt Readings from Last 1 Encounters:  05/24/19 40.8 kg    Ideal Body Weight:  45.5 kg  BMI:  Body mass index is 17.58 kg/m.  Estimated Nutritional Needs:   Kcal:  1200-1400  Protein:  55-70 grams  Fluid:  > 1.2 L    Loistine Chance, RD, LDN, [place] Registered Dietitian II Certified Diabetes Care and Education Specialist Please refer to [facility] for RD and/or RD on-call/weekend/after hours pager

## 2019-05-28 ENCOUNTER — Encounter (HOSPITAL_COMMUNITY): Payer: Self-pay

## 2019-05-28 ENCOUNTER — Inpatient Hospital Stay (HOSPITAL_COMMUNITY): Payer: Medicare HMO | Admitting: Anesthesiology

## 2019-05-28 ENCOUNTER — Encounter (HOSPITAL_COMMUNITY): Admission: EM | Disposition: A | Discharge status: Skilled Nursing Facility | Payer: Self-pay | Source: Home / Self Care

## 2019-05-28 ENCOUNTER — Inpatient Hospital Stay (HOSPITAL_COMMUNITY): Payer: Medicare HMO

## 2019-05-28 DIAGNOSIS — W19XXXA Unspecified fall, initial encounter: Secondary | ICD-10-CM

## 2019-05-28 DIAGNOSIS — E440 Moderate protein-calorie malnutrition: Secondary | ICD-10-CM | POA: Insufficient documentation

## 2019-05-28 HISTORY — PX: OPEN REDUCTION INTERNAL FIXATION (ORIF) DISTAL RADIAL FRACTURE: SHX5989

## 2019-05-28 LAB — CBC
HCT: 35.6 % — ABNORMAL LOW (ref 36.0–46.0)
Hemoglobin: 11.3 g/dL — ABNORMAL LOW (ref 12.0–15.0)
MCH: 27 pg (ref 26.0–34.0)
MCHC: 31.7 g/dL (ref 30.0–36.0)
MCV: 85.2 fL (ref 80.0–100.0)
Platelets: 212 10*3/uL (ref 150–400)
RBC: 4.18 MIL/uL (ref 3.87–5.11)
RDW: 14.4 % (ref 11.5–15.5)
WBC: 9.7 10*3/uL (ref 4.0–10.5)
nRBC: 0 % (ref 0.0–0.2)

## 2019-05-28 LAB — PROTIME-INR
INR: 1.2 (ref 0.8–1.2)
Prothrombin Time: 15.5 s — ABNORMAL HIGH (ref 11.4–15.2)

## 2019-05-28 LAB — BASIC METABOLIC PANEL
Anion gap: 10 (ref 5–15)
Anion gap: 13 (ref 5–15)
BUN: 21 mg/dL (ref 8–23)
BUN: 14 mg/dL (ref 8–23)
CO2: 17 mmol/L — ABNORMAL LOW (ref 22–32)
CO2: 19 mmol/L — ABNORMAL LOW (ref 22–32)
Calcium: 8.3 mg/dL — ABNORMAL LOW (ref 8.9–10.3)
Calcium: 8.6 mg/dL — ABNORMAL LOW (ref 8.9–10.3)
Chloride: 98 mmol/L (ref 98–111)
Chloride: 100 mmol/L (ref 98–111)
Creatinine, Ser: 0.73 mg/dL (ref 0.44–1.00)
Creatinine, Ser: 0.61 mg/dL (ref 0.44–1.00)
GFR calc Af Amer: >60 mL/min (ref >60)
GFR calc Af Amer: >60 mL/min (ref >60)
GFR calc non Af Amer: >60 mL/min (ref >60)
GFR calc non Af Amer: >60 mL/min (ref >60)
Glucose, Bld: 154 mg/dL — ABNORMAL HIGH (ref 70–99)
Glucose, Bld: 135 mg/dL — ABNORMAL HIGH (ref 70–99)
Potassium: 5 mmol/L (ref 3.5–5.1)
Potassium: 4.4 mmol/L (ref 3.5–5.1)
Sodium: 125 mmol/L — ABNORMAL LOW (ref 135–145)
Sodium: 132 mmol/L — ABNORMAL LOW (ref 135–145)

## 2019-05-28 LAB — SODIUM, URINE, RANDOM: Sodium, Ur: 75 mmol/L

## 2019-05-28 LAB — GLUCOSE, CAPILLARY
Glucose-Capillary: 132 mg/dL — ABNORMAL HIGH (ref 70–99)
Glucose-Capillary: 128 mg/dL — ABNORMAL HIGH (ref 70–99)
Glucose-Capillary: 121 mg/dL — ABNORMAL HIGH (ref 70–99)
Glucose-Capillary: 134 mg/dL — ABNORMAL HIGH (ref 70–99)
Glucose-Capillary: 139 mg/dL — ABNORMAL HIGH (ref 70–99)

## 2019-05-28 LAB — OSMOLALITY, URINE: Osmolality, Ur: 247 mOsm/kg — ABNORMAL LOW (ref 300–900)

## 2019-05-28 SURGERY — OPEN REDUCTION INTERNAL FIXATION (ORIF) DISTAL RADIUS FRACTURE
Anesthesia: Monitor Anesthesia Care | Site: Wrist | Laterality: Left

## 2019-05-28 MED ORDER — SODIUM CHLORIDE 1 G PO TABS
2.0000 g | ORAL_TABLET | Freq: Three times a day (TID) | ORAL | Status: DC
  Administered 2019-05-28 – 2019-06-03 (×19): 2 g via ORAL
  Filled 2019-05-28 (×23): qty 2

## 2019-05-28 MED ORDER — PROPOFOL 1000 MG/100ML IV EMUL
INTRAVENOUS | Status: AC
  Filled 2019-05-28: qty 100

## 2019-05-28 MED ORDER — LIDOCAINE 2% (20 MG/ML) 5 ML SYRINGE
INTRAMUSCULAR | Status: DC | PRN
  Administered 2019-05-28: 20 mg via INTRAVENOUS

## 2019-05-28 MED ORDER — BUPIVACAINE HCL (PF) 0.5 % IJ SOLN
INTRAMUSCULAR | Status: DC | PRN
  Administered 2019-05-28: 25 mL via PERINEURAL

## 2019-05-28 MED ORDER — ONDANSETRON HCL 4 MG/2ML IJ SOLN: 4.0000 mg | Freq: Four times a day (QID) | INTRAMUSCULAR | Status: DC | PRN

## 2019-05-28 MED ORDER — CHLORHEXIDINE GLUCONATE CLOTH 2 % EX PADS
6.0000 | MEDICATED_PAD | Freq: Every day | CUTANEOUS | Status: DC
  Administered 2019-05-28 – 2019-06-03 (×6): 6 via TOPICAL

## 2019-05-28 MED ORDER — PROPOFOL 500 MG/50ML IV EMUL
INTRAVENOUS | Status: DC | PRN
  Administered 2019-05-28: 70 ug/kg/min via INTRAVENOUS

## 2019-05-28 MED ORDER — LACTATED RINGERS IV SOLN: INTRAVENOUS | Status: DC

## 2019-05-28 MED ORDER — FENTANYL CITRATE (PF) 100 MCG/2ML IJ SOLN: 25.0000 ug | INTRAMUSCULAR | Status: DC | PRN

## 2019-05-28 MED ORDER — SODIUM CHLORIDE 0.9 % IV SOLN: INTRAVENOUS | Status: DC | PRN

## 2019-05-28 MED ORDER — PHENYLEPHRINE HCL-NACL 10-0.9 MG/250ML-% IV SOLN
INTRAVENOUS | Status: DC | PRN
  Administered 2019-05-28: 30 ug/min via INTRAVENOUS

## 2019-05-28 MED ORDER — PROPOFOL 10 MG/ML IV BOLUS
INTRAVENOUS | Status: DC | PRN
  Administered 2019-05-28: 10 mg via INTRAVENOUS
  Administered 2019-05-28: 20 mg via INTRAVENOUS

## 2019-05-28 MED ORDER — ONDANSETRON HCL 4 MG/2ML IJ SOLN
INTRAMUSCULAR | Status: AC
  Filled 2019-05-28: qty 2

## 2019-05-28 MED ORDER — SODIUM CHLORIDE 0.9 % IV SOLN
1.0000 g | INTRAVENOUS | Status: AC
  Administered 2019-05-28 – 2019-05-30 (×3): 1 g via INTRAVENOUS
  Filled 2019-05-28: qty 1
  Filled 2019-05-28: qty 10
  Filled 2019-05-28 (×2): qty 1

## 2019-05-28 MED ORDER — 0.9 % SODIUM CHLORIDE (POUR BTL) OPTIME
TOPICAL | Status: DC | PRN
  Administered 2019-05-28: 15:00:00 1000 mL

## 2019-05-28 MED ORDER — SODIUM CHLORIDE 3 % IV SOLN: INTRAVENOUS | Status: DC

## 2019-05-28 MED ORDER — SODIUM CHLORIDE 3 % IV SOLN
INTRAVENOUS | Status: DC
  Administered 2019-05-28: 30 mL/h via INTRAVENOUS
  Filled 2019-05-28: qty 500

## 2019-05-28 MED ORDER — FENTANYL CITRATE (PF) 100 MCG/2ML IJ SOLN
INTRAMUSCULAR | Status: DC | PRN
  Administered 2019-05-28: 25 ug via INTRAVENOUS

## 2019-05-28 MED ORDER — SODIUM CHLORIDE 0.9 % IV SOLN
INTRAVENOUS | Status: DC | PRN
  Administered 2019-05-28: 500 mL via INTRAVENOUS

## 2019-05-28 MED ORDER — FENTANYL CITRATE (PF) 100 MCG/2ML IJ SOLN
INTRAMUSCULAR | Status: AC
  Filled 2019-05-28: qty 2

## 2019-05-28 MED ORDER — OXYCODONE HCL 5 MG/5ML PO SOLN: 5.0000 mg | Freq: Once | ORAL | Status: DC | PRN

## 2019-05-28 MED ORDER — OXYCODONE HCL 5 MG PO TABS: 5.0000 mg | ORAL_TABLET | Freq: Once | ORAL | Status: DC | PRN

## 2019-05-28 MED ORDER — ONDANSETRON HCL 4 MG/2ML IJ SOLN
INTRAMUSCULAR | Status: DC | PRN
  Administered 2019-05-28: 4 mg via INTRAVENOUS

## 2019-05-28 MED ORDER — BUPIVACAINE HCL (PF) 0.25 % IJ SOLN
INTRAMUSCULAR | Status: AC
  Filled 2019-05-28: qty 30

## 2019-05-28 MED ORDER — PHENYLEPHRINE 40 MCG/ML (10ML) SYRINGE FOR IV PUSH (FOR BLOOD PRESSURE SUPPORT)
PREFILLED_SYRINGE | INTRAVENOUS | Status: DC | PRN
  Administered 2019-05-28: 40 ug via INTRAVENOUS
  Administered 2019-05-28: 80 ug via INTRAVENOUS

## 2019-05-28 MED ORDER — PHENYLEPHRINE 40 MCG/ML (10ML) SYRINGE FOR IV PUSH (FOR BLOOD PRESSURE SUPPORT)
PREFILLED_SYRINGE | INTRAVENOUS | Status: AC
  Filled 2019-05-28: qty 20

## 2019-05-28 SURGICAL SUPPLY — 40 items
APL PRP STRL LF DISP 70% ISPRP (MISCELLANEOUS) ×1
BIT DRILL 2.2 SS TIBIAL (BIT) ×2 IMPLANT
BNDG CMPR 9X4 STRL LF SNTH (GAUZE/BANDAGES/DRESSINGS) ×1
BNDG ELASTIC 3X5.8 VLCR STR LF (GAUZE/BANDAGES/DRESSINGS) ×2 IMPLANT
BNDG ESMARK 4X9 LF (GAUZE/BANDAGES/DRESSINGS) ×2 IMPLANT
CANISTER SUCT 3000ML PPV (MISCELLANEOUS) ×3 IMPLANT
CHLORAPREP W/TINT 26 (MISCELLANEOUS) ×3 IMPLANT
CORD BIPOLAR FORCEPS 12FT (ELECTRODE) ×3 IMPLANT
COVER SURGICAL LIGHT HANDLE (MISCELLANEOUS) ×3 IMPLANT
COVER WAND RF STERILE (DRAPES) ×3 IMPLANT
CUFF TOURN SGL QUICK 18X4 (TOURNIQUET CUFF) ×3 IMPLANT
DRAPE OEC MINIVIEW 54X84 (DRAPES) ×3 IMPLANT
DRAPE SURG 17X23 STRL (DRAPES) ×3 IMPLANT
DRSG XEROFORM 1X8 (GAUZE/BANDAGES/DRESSINGS) ×2 IMPLANT
GAUZE XEROFORM 1X8 LF (GAUZE/BANDAGES/DRESSINGS) ×3 IMPLANT
GLOVE BIOGEL M 8.0 STRL (GLOVE) ×3 IMPLANT
GOWN STRL REUS W/ TWL XL LVL3 (GOWN DISPOSABLE) ×2 IMPLANT
GOWN STRL REUS W/TWL XL LVL3 (GOWN DISPOSABLE) ×6
K-WIRE 1.6 (WIRE) ×6
K-WIRE FX5X1.6XNS BN SS (WIRE) ×2
KIT BASIN OR (CUSTOM PROCEDURE TRAY) ×3 IMPLANT
KWIRE FX5X1.6XNS BN SS (WIRE) IMPLANT
NS IRRIG 1000ML POUR BTL (IV SOLUTION) ×3 IMPLANT
PACK ORTHO EXTREMITY (CUSTOM PROCEDURE TRAY) ×3 IMPLANT
PAD CAST 3X4 CTTN HI CHSV (CAST SUPPLIES) IMPLANT
PADDING CAST COTTON 3X4 STRL (CAST SUPPLIES) ×3
PEG LOCKING SMOOTH 2.2X16 (Screw) ×2 IMPLANT
PEG LOCKING SMOOTH 2.2X18 (Peg) ×10 IMPLANT
PLATE NARROW DVR LEFT (Plate) ×2 IMPLANT
SCREW 2.7X12MM (Screw) ×6 IMPLANT
SPLINT FIBERGLASS 3X12 (CAST SUPPLIES) ×2 IMPLANT
SUT ETHILON 3 0 PS 1 (SUTURE) ×3 IMPLANT
SUT VIC AB 3-0 PS1 18 (SUTURE) ×6
SUT VIC AB 3-0 PS1 18X BRD (SUTURE) IMPLANT
SUT VIC AB 3-0 PS1 18XBRD (SUTURE) ×1 IMPLANT
SUT VICRYL 4-0 PS2 18IN ABS (SUTURE) ×3 IMPLANT
TOWEL GREEN STERILE FF (TOWEL DISPOSABLE) ×3 IMPLANT
TUBE CONNECTING 20'X1/4 (TUBING) ×1
TUBE CONNECTING 20X1/4 (TUBING) ×2 IMPLANT
UNDERPAD 30X30 (UNDERPADS AND DIAPERS) ×3 IMPLANT

## 2019-05-28 NOTE — Progress Notes (Signed)
Per Estill Bamberg, RN, she is awaiting further instructions from Dr. Bobbye Morton I the pt will have surgery today. She will call back with an update to Short Stay.

## 2019-05-28 NOTE — Progress Notes (Signed)
Pt seen and examined.  Consent obtained for ORIF L distal radius.  All questions answered.  Pt's labs reviewed - Na and INR normalized. Will proceed with surgery as planned.

## 2019-05-28 NOTE — TOC Initial Note (Signed)
Transition of Care [facility]) - Initial/Assessment Note    Patient Details  Name: [name] [name] MRN: [id number] Date of Birth: April 13, 1929  Transition of Care [facility]) CM/SW Contact:    Ella Bodo, RN Phone Number: 05/28/2019, 9:03 PM  Clinical Narrative:     Pt admitted s/p fall with back and sternal fx, Lt distal radius fx.  PTA, pt resided at home and needed assistance with ADLS.  She has intermittent caregivers at home, but not 24h supervision.   Met with pt and daughter, Collie Siad, to discuss discharge plans.  PT/OT recommending SNF for rehab, and pt/family agreeable.  Will initiate FL2 and fax out for bed search.  Daughter prefers [place] [facility], if possible.               Expected Discharge Plan: Skilled Nursing Facility Barriers to Discharge: Continued Medical Work up   Patient Goals and CMS Choice        Expected Discharge Plan and Services Expected Discharge Plan: [place]   Discharge Planning Services: CM Consult Post Acute Care Choice: [place] Living arrangements for the past 2 months: Single Family Home                                      Prior Living Arrangements/Services Living arrangements for the past 2 months: Single Family Home Lives with:: Self, Other (Comment)(caregivers) Patient language and need for interpreter reviewed:: Yes        Need for Family Participation in Patient Care: Yes (Comment) Care giver support system in place?: Yes (comment)   Criminal Activity/Legal Involvement Pertinent to Current Situation/Hospitalization: No - Comment as needed  Activities of Daily Living Home Assistive Devices/Equipment: Walker (specify type), Hearing aid ADL Screening (condition at time of admission) Patient's cognitive ability adequate to safely complete daily activities?: Yes Is the patient deaf or have difficulty hearing?: Yes Does the patient have difficulty concentrating, remembering, or making decisions?:  No Patient able to express need for assistance with ADLs?: Yes Does the patient have difficulty dressing or bathing?: No Independently performs ADLs?: Yes (appropriate for developmental age) Does the patient have difficulty walking or climbing stairs?: Yes Weakness of Legs: Both Weakness of Arms/Hands: None  Permission Sought/Granted Permission sought to share information with : Facility Art therapist granted to share information with : Yes, Verbal Permission Granted              Emotional Assessment Appearance:: Appears stated age Attitude/Demeanor/Rapport: Engaged Affect (typically observed): Accepting Orientation: : Oriented to Self Alcohol / Substance Use: Not Applicable Psych Involvement: No (comment)  Admission diagnosis:  Trauma [T14.90XA] Wrist pain [M25.539] Left wrist pain [M25.532] Compressed spine fracture [facility]) [M48.50XA] Patient Active Problem List   Diagnosis Date Noted  . Malnutrition of moderate degree 05/28/2019  . Pressure injury of skin 05/27/2019  . Compressed spine fracture ([place]) 05/25/2019  . Near syncope 06/17/2015  . Pre-syncope 06/17/2015  . Anticoagulant long-term use 08/27/2010  . Chest pain, unspecified 06/29/2010  . MURMUR 05/17/2009  . VASCULITIS 11/23/2008  . HYPERCHOLESTEROLEMIA 05/25/2008  . Essential hypertension 05/25/2008  . ATRIAL FIBRILLATION 05/25/2008  . CVA 05/25/2008  . TIA 05/25/2008  . GIANT CELL ARTERITIS 05/25/2008  . FATIGUE 05/25/2008  . Palpitations 05/25/2008  . DYSPNEA 05/25/2008   PCP:  Raelene Bott, MD Pharmacy:   [id number] [place]  [address] [place] [address] Phone: [phone] Fax: [phone]     Social Determinants of Health (SDOH) Interventions    Readmission Risk Interventions No flowsheet data found.

## 2019-05-28 NOTE — Anesthesia Procedure Notes (Signed)
Anesthesia Regional Block: Supraclavicular block   Pre-Anesthetic Checklist: ,, timeout performed, Correct Patient, Correct Site, Correct Laterality, Correct Procedure, Correct Position, site marked, Risks and benefits discussed,  Surgical consent,  Pre-op evaluation,  At surgeon's request and post-op pain management  Laterality: Left  Prep: chloraprep       Needles:  Injection technique: Single-shot  Needle Type: Echogenic Stimulator Needle     Needle Length: 5cm  Needle Gauge: 22     Additional Needles:   Procedures:, nerve stimulator,,,,,,,   Nerve Stimulator or Paresthesia:  Response: biceps flexion, 0.45 mA,   Additional Responses:   Narrative:  Start time: 05/28/2019 2:03 PM End time: 05/28/2019 2:13 PM Injection made incrementally with aspirations every 5 mL.  Performed by: Personally  Anesthesiologist: Albertha Ghee, MD  Additional Notes: Functioning IV was confirmed and monitors were applied.  A 96mm 22ga Arrow echogenic stimulator needle was used. Sterile prep and drape,hand hygiene and sterile gloves were used.  Negative aspiration and negative test dose prior to incremental administration of local anesthetic. The patient tolerated the procedure well.  Ultrasound guidance: relevent anatomy identified, needle position confirmed, local anesthetic spread visualized around nerve(s), vascular puncture avoided.  Image printed for medical record.

## 2019-05-28 NOTE — Progress Notes (Signed)
Patient ID: [name] [name], female   DOB: December 30, 1929, 86 y.o.   MRN: [id number]    Day of Surgery  Subjective: No complaints.  Feels well this am, but not eating well.  Urine output has picked up after bolus and fluids.  Not confused this morning.  ROS: See above, otherwise other systems negative  Objective: Vital signs in last 24 hours: Temp:  [97.8 F (36.6 C)-98.2 F (36.8 C)] 98 F (36.7 C) (03/19 0439) Pulse Rate:  [49-80] 80 (03/19 0439) Resp:  [16-18] 17 (03/19 0439) BP: (113-137)/(48-85) 121/85 (03/19 0439) SpO2:  [91 %-96 %] 94 % (03/19 0439) Last BM Date: 05/27/19  Intake/Output from previous day: 03/18 0701 - 03/19 0700 In: 1387.5 [P.O.:360; I.V.:952.5; IV Piggyback:75] Out: 1075 [id number] Intake/Output this shift: No intake/output data recorded.  PE: Gen: NAD laying in bed HENT: PERRL Heart: regular, + murmur Lungs: CTAB Abd: soft, NT, ND, +BS, corset around lumbar spine Ext: Left wrist in splint, moves fingers, warm Neuro: cranial nervies II-XII grossly intact, normal sensation throughout Skin: warm and dry Psych: A&Ox3 this am  Lab Results:  Recent Labs    05/27/19 1510 05/28/19 0104  WBC 9.3 9.7  HGB 12.2 11.3*  HCT 39.0 35.6*  PLT 239 212   BMET Recent Labs    05/27/19 1510 05/28/19 0104  NA 123* 125*  K 4.9 5.0  CL 90* 98  CO2 21* 17*  GLUCOSE 151* 154*  BUN 26* 21  CREATININE 0.80 0.73  CALCIUM 8.5* 8.3*   PT/INR Recent Labs    05/27/19 1540 05/28/19 0104  LABPROT 19.6* 15.5*  INR 1.7* 1.2   CMP     Component Value Date/Time   NA 125 (L) 05/28/2019 0104   K 5.0 05/28/2019 0104   CL 98 05/28/2019 0104   CO2 17 (L) 05/28/2019 0104   GLUCOSE 154 (H) 05/28/2019 0104   BUN 21 05/28/2019 0104   CREATININE 0.73 05/28/2019 0104   CALCIUM 8.3 (L) 05/28/2019 0104   PROT 7.3 05/24/2019 2256   ALBUMIN 4.1 05/24/2019 2256   AST 55 (H) 05/24/2019 2256   ALT 43 05/24/2019 2256   ALKPHOS 151 (H) 05/24/2019 2256   BILITOT 0.9  05/24/2019 2256   GFRNONAA >60 05/28/2019 0104   GFRAA >60 05/28/2019 0104   Lipase  No results found for: LIPASE     Studies/Results: ECHOCARDIOGRAM COMPLETE  Result Date: 05/27/2019    ECHOCARDIOGRAM REPORT   Patient Name:   [name] [name] Date of Exam: 05/26/2019 Medical Rec #:  [id number]       Height:       60.0 in Accession #:    [id number]      Weight:       90.0 lb Date of Birth:  May 20, 1929        BSA:          1.329 m Patient Age:    89 years        BP:           104/61 mmHg Patient Gender: F               HR:           57 bpm. Exam Location:  Inpatient Procedure: 2D Echo Indications:    Aortic Stenosis 424.1 / 135.0  History:        Patient has prior history of Echocardiogram examinations, most  recent 07/13/2015. CAD, Stroke and TIA, Arrythmias:Atrial                 Fibrillation, Signs/Symptoms:Murmur; Risk Factors:Hypertension.  Sonographer:    Darlina Sicilian RDCS Referring Phys: [id number] [place]  1. Left ventricular ejection fraction, by estimation, is 55 to 60%. The left ventricle has normal function. The left ventricle has no regional wall motion abnormalities. There is moderate left ventricular hypertrophy. Left ventricular diastolic parameters are indeterminate.  2. Right ventricular systolic function is mildly reduced. The right ventricular size is normal. There is mildly elevated pulmonary artery systolic pressure. The estimated right ventricular systolic pressure is 123XX123 mmHg.  3. Left atrial size was severely dilated.  4. The mitral valve is abnormal. Severe leaflet thickening. Severe mitral annular calfications. Moderate mitral valve regurgitation.  5. Tricuspid valve regurgitation is moderate.  6. There is a 23 mm Edwards Sapien prosthetic, stented (TAVR) valve present in the aortic position. Aortic valve regurgitation is mild. Echo findings are consistent with normal structure and function of the aortic valve prosthesis. Mean gradient 11mmHg.  7.  Aortic dilatation noted. There is dilatation of the ascending aorta measuring 40 mm.  8. The inferior vena cava is normal in size with <50% respiratory variability, suggesting right atrial pressure of 8 mmHg. FINDINGS  Left Ventricle: Left ventricular ejection fraction, by estimation, is 55 to 60%. The left ventricle has normal function. The left ventricle has no regional wall motion abnormalities. The left ventricular internal cavity size was small. There is moderate  left ventricular hypertrophy. Left ventricular diastolic parameters are indeterminate. Right Ventricle: The right ventricular size is normal. No increase in right ventricular wall thickness. Right ventricular systolic function is mildly reduced. There is mildly elevated pulmonary artery systolic pressure. The tricuspid regurgitant velocity  is 2.53 m/s, and with an assumed right atrial pressure of 8 mmHg, the estimated right ventricular systolic pressure is 123XX123 mmHg. Left Atrium: Left atrial size was severely dilated. Right Atrium: Right atrial size was normal in size. Pericardium: There is no evidence of pericardial effusion. Presence of pericardial fat pad. Mitral Valve: The mitral valve is abnormal. There is severe thickening of the mitral valve leaflet(s). Severe mitral annular calcification. Moderate mitral valve regurgitation. MV peak gradient, 3.7 mmHg. The mean mitral valve gradient is 1.0 mmHg. Tricuspid Valve: The tricuspid valve is normal in structure. Tricuspid valve regurgitation is moderate. Aortic Valve: The aortic valve has been repaired/replaced. Aortic valve regurgitation is mild. Aortic regurgitation PHT measures 942 msec. Aortic valve mean gradient measures 3.0 mmHg. Aortic valve peak gradient measures 6.0 mmHg. Aortic valve area, by VTI measures 2.33 cm. There is a 23 mm Edwards Sapien prosthetic, stented (TAVR) valve present in the aortic position. Echo findings are consistent with normal structure and function of the aortic  valve prosthesis. Pulmonic Valve: The pulmonic valve was not well visualized. Pulmonic valve regurgitation is not visualized. Aorta: Aortic dilatation noted. There is dilatation of the ascending aorta measuring 40 mm. Venous: The inferior vena cava is normal in size with less than 50% respiratory variability, suggesting right atrial pressure of 8 mmHg. IAS/Shunts: The interatrial septum was not well visualized.  LEFT VENTRICLE PLAX 2D LVIDd:         2.90 cm LVIDs:         2.20 cm LV PW:         1.40 cm LV IVS:        1.50 cm LVOT diam:     2.20 cm  LV SV:         44 LV SV Index:   33 LVOT Area:     3.80 cm  RIGHT VENTRICLE RV S prime:     7.95 cm/s LEFT ATRIUM              Index       RIGHT ATRIUM           Index LA diam:        4.70 cm  3.54 cm/m  RA Area:     12.60 cm LA Vol (A2C):   83.2 ml  62.58 ml/m RA Volume:   25.00 ml  18.80 ml/m LA Vol (A4C):   117.0 ml 88.01 ml/m LA Biplane Vol: 98.0 ml  73.72 ml/m  AORTIC VALVE AV Area (Vmax):    2.46 cm AV Area (Vmean):   2.74 cm AV Area (VTI):     2.33 cm AV Vmax:           122.00 cm/s AV Vmean:          75.400 cm/s AV VTI:            0.189 m AV Peak Grad:      6.0 mmHg AV Mean Grad:      3.0 mmHg LVOT Vmax:         79.00 cm/s LVOT Vmean:        54.300 cm/s LVOT VTI:          0.116 m LVOT/AV VTI ratio: 0.61 AI PHT:            942 msec  AORTA Ao Root diam: 2.20 cm Ao Asc diam:  4.00 cm MITRAL VALVE               TRICUSPID VALVE MV Area (PHT): 2.94 cm    TR Peak grad:   25.6 mmHg MV Peak grad:  3.7 mmHg    TR Vmax:        253.00 cm/s MV Mean grad:  1.0 mmHg MV Vmax:       0.97 m/s    SHUNTS MV Vmean:      42.2 cm/s   Systemic VTI:  0.12 m MV Decel Time: 258 msec    Systemic Diam: 2.20 cm MR PISA:        1.01 cm MR PISA Radius: 0.40 cm MV E velocity: 91.10 cm/s Oswaldo Milian MD Electronically signed by Oswaldo Milian MD Signature Date/Time: 05/27/2019/2:11:20 AM    Final     Anti-infectives: Anti-infectives (From admission, onward)   Start      Dose/Rate Route Frequency Ordered Stop   05/28/19 0815  cefTRIAXone (ROCEPHIN) 1 g in sodium chloride 0.9 % 100 mL IVPB     1 g 200 mL/hr over 30 Minutes Intravenous Every 24 hours 05/28/19 0802 05/31/19 0814       Assessment/Plan 59F s/p mechanical GLF Sternal fracture- continuecardiac monitoring T7/L4/L5 compression fractures- NSGY c/s (Dr. Ronnald Ramp), lumbar corset as management L distal radius fracture- possible OR today pending how anesthesia feels about her Na level.  INR corrected to 1.2 A fib - currently NSR, had junctional rhythm yesterday.  Evaluated by cards and appreciate their assistance. H/O TAVR - doesn't need coumadin for this type of valve per cardiology.  Ok to reverse coumadin pending timing of surgery. DM - consult diabetes coordinator as her home regimen is not available here, SSI ESBL UTI - sensitive to Rocephin.  Will treat for 3 days Hyponatremia - increase salt  tabs to 2g TID, on NS, but moving to ICU to place on hypertonic 3% saline to help correct this. FEN-NPO for possible surgery DVT- SCDs,coumadin held, INR 1.2 this am Dispo -ICU for hypertonic saline infusion  Will call daughter to update her on transfer   LOS: 3 days    Henreitta Cea , [facility] 05/28/2019, 8:03 AM Please see Amion for pager number during day hours 7:00am-4:30pm or 7:00am -11:30am on weekends

## 2019-05-28 NOTE — Progress Notes (Signed)
PT Cancellation Note  Patient Details Name: JATON SEIBERT MRN: [id number] DOB: 1930/01/05   Cancelled Treatment:    Reason Eval/Treat Not Completed: Patient at procedure or test/unavailable . Pt is currently at OR for ORIF of L radius, PT will continue to follow/treat as time and schedule allow.   Karma Ganja, PT, DPT   Acute Rehabilitation Department Pager #: [phone]  Otho Bellows 05/28/2019, 1:58 PM

## 2019-05-28 NOTE — Anesthesia Preprocedure Evaluation (Signed)
Anesthesia Evaluation  Patient identified by MRN, date of birth, ID band Patient awake    Reviewed: Allergy & Precautions, H&P , NPO status , Patient's Chart, lab work & pertinent test results  History of Anesthesia Complications (+) PONV and history of anesthetic complications  Airway Mallampati: II   Neck ROM: full    Dental   Pulmonary shortness of breath,    breath sounds clear to auscultation       Cardiovascular hypertension, + CAD and + Peripheral Vascular Disease  + dysrhythmias Atrial Fibrillation + Valvular Problems/Murmurs  Rhythm:regular Rate:Normal  S/p TAVR   Neuro/Psych PSYCHIATRIC DISORDERS Depression TIACVA    GI/Hepatic   Endo/Other    Renal/GU      Musculoskeletal  (+) Arthritis ,   Abdominal   Peds  Hematology   Anesthesia Other Findings   Reproductive/Obstetrics                             Anesthesia Physical Anesthesia Plan  ASA: III  Anesthesia Plan: MAC   Post-op Pain Management:  Regional for Post-op pain   Induction: Intravenous  PONV Risk Score and Plan: 3 and Ondansetron, Propofol infusion and Treatment may vary due to age or medical condition  Airway Management Planned: Simple Face Mask  Additional Equipment:   Intra-op Plan:   Post-operative Plan:   Informed Consent: I have reviewed the patients History and Physical, chart, labs and discussed the procedure including the risks, benefits and alternatives for the proposed anesthesia with the patient or authorized representative who has indicated his/her understanding and acceptance.       Plan Discussed with: CRNA, Anesthesiologist and Surgeon  Anesthesia Plan Comments:         Anesthesia Quick Evaluation

## 2019-05-28 NOTE — Anesthesia Procedure Notes (Signed)
Procedure Name: MAC Date/Time: 05/28/2019 2:40 PM Performed by: Janene Harvey, CRNA Pre-anesthesia Checklist: Patient identified, Emergency Drugs available, Suction available and Patient being monitored Patient Re-evaluated:Patient Re-evaluated prior to induction Oxygen Delivery Method: Simple face mask Placement Confirmation: positive ETCO2 Dental Injury: Teeth and Oropharynx as per pre-operative assessment

## 2019-05-28 NOTE — Progress Notes (Signed)
Patient arrived to 6N3, transferred from 4N, s/p ORIF of left distal radius,  AOx4, VSS, denies pain, oriented to room, call bell and use of bed controls, due meds given, will endorsed accordingly to incoming RN.

## 2019-05-28 NOTE — [place] FL2 (Signed)
[place] MEDICAID FL2 LEVEL OF CARE SCREENING TOOL     IDENTIFICATION  Patient Name: [name] [name] Birthdate: 10/25/1929 Sex: female Admission Date (Current Location): 05/24/2019  [facility] and [place] Number:  Herbalist and Address:  [facility] [place], [place] [address] [place], [place] [address]      Provider Number: [id number]  Attending Physician Name and Address:  Md, Trauma, MD  Relative Name and Phone Number:  Ursula Beath, Daughter  Phone:  [phone]; [phone]    Current Level of Care: Hospital Recommended Level of Care: [place] Prior Approval Number:    Date Approved/Denied:   PASRR Number: [id number]  Discharge Plan: SNF    Current Diagnoses: Patient Active Problem List   Diagnosis Date Noted  . Malnutrition of moderate degree 05/28/2019  . Pressure injury of skin 05/27/2019  . Compressed spine fracture ([place]) 05/25/2019  . Near syncope 06/17/2015  . Pre-syncope 06/17/2015  . Anticoagulant long-term use 08/27/2010  . Chest pain, unspecified 06/29/2010  . MURMUR 05/17/2009  . VASCULITIS 11/23/2008  . HYPERCHOLESTEROLEMIA 05/25/2008  . Essential hypertension 05/25/2008  . ATRIAL FIBRILLATION 05/25/2008  . CVA 05/25/2008  . TIA 05/25/2008  . GIANT CELL ARTERITIS 05/25/2008  . FATIGUE 05/25/2008  . Palpitations 05/25/2008  . DYSPNEA 05/25/2008    Orientation RESPIRATION BLADDER Height & Weight     Self  Normal External catheter Weight: 44 kg Height:  5' (152.4 cm)  BEHAVIORAL SYMPTOMS/MOOD NEUROLOGICAL BOWEL NUTRITION STATUS      Incontinent Diet  AMBULATORY STATUS COMMUNICATION OF NEEDS Skin   Extensive Assist Verbally Bruising(arm, buttocks, leg)                       Personal Care Assistance Level of Assistance  Bathing, Feeding, Dressing Bathing Assistance: Maximum assistance Feeding assistance: Limited assistance Dressing Assistance: Maximum assistance     Functional Limitations  Info  Hearing(HOH) Sight Info: Adequate Hearing Info: Adequate      SPECIAL CARE FACTORS FREQUENCY  PT (By licensed PT), OT (By licensed OT)                    Contractures Contractures Info: Not present    Additional Factors Info  Insulin Sliding Scale, Code Status, Allergies Code Status Info: Full Code Allergies Info: Cipro- tachycardia; Escitalopram-nausea, chest tightness, lip swelling, dizziness; Ranitidine-Diarrhea   Insulin Sliding Scale Info: Novolog 0-15 units TID       Current Medications (05/28/2019):  This is the current hospital active medication list Current Facility-Administered Medications  Medication Dose Route Frequency Provider Last Rate Last Admin  . 0.9 %  sodium chloride infusion   Intravenous Continuous Saverio Danker, PA-C 75 mL/hr at 05/27/19 1600 New Bag at 05/27/19 1600  . acetaminophen (TYLENOL) tablet 1,000 mg  1,000 mg Oral Q6H Jesusita Oka, MD   1,000 mg at 05/28/19 0528  . amiodarone (PACERONE) tablet 200 mg  200 mg Oral Daily Saverio Danker, PA-C   200 mg at 05/27/19 1020  . atorvastatin (LIPITOR) tablet 10 mg  10 mg Oral Venetia Maxon, PA-C   10 mg at 05/27/19 2051  . bisacodyl (DULCOLAX) suppository 10 mg  10 mg Rectal Daily PRN Donnie Mesa, MD   10 mg at 05/25/19 2047  . budesonide (ENTOCORT EC) 24 hr capsule 6 mg  6 mg Oral Daily Saverio Danker, PA-C   6 mg at 05/27/19 1020  . cefTRIAXone (ROCEPHIN) 1 g in sodium chloride  0.9 % 100 mL IVPB  1 g Intravenous Q24H Saverio Danker, PA-C      . docusate sodium (COLACE) capsule 100 mg  100 mg Oral BID Jesusita Oka, MD   100 mg at 05/27/19 2052  . DULoxetine (CYMBALTA) DR capsule 30 mg  30 mg Oral Daily Saverio Danker, PA-C   30 mg at 05/27/19 1020  . feeding supplement (BOOST / RESOURCE BREEZE) liquid 1 Container  1 Container Oral TID BM Jesusita Oka, MD   1 Container at 05/26/19 2046  . insulin aspart (novoLOG) injection 0-15 Units  0-15 Units Subcutaneous TID WC Jesusita Oka, MD   2 Units at 05/27/19 1656  . insulin glargine (LANTUS) injection 7 Units  7 Units Subcutaneous Daily Saverio Danker, PA-C   7 Units at 05/27/19 1020  . lidocaine (XYLOCAINE) 2 % (with pres) injection 200 mg  10 mL Infiltration Once Drenda Freeze, MD   Stopped at 05/25/19 0335  . lisinopril (ZESTRIL) tablet 5 mg  5 mg Oral Daily Saverio Danker, PA-C   5 mg at 05/27/19 1020  . magnesium citrate solution 1 Bottle  1 Bottle Oral Once Jesusita Oka, MD      . methocarbamol (ROBAXIN) tablet 1,000 mg  1,000 mg Oral Q8H Jesusita Oka, MD   1,000 mg at 05/28/19 0528  . mirtazapine (REMERON) tablet 7.5 mg  7.5 mg Oral QHS Saverio Danker, PA-C   7.5 mg at 05/27/19 2051  . morphine 2 MG/ML injection 1 mg  1 mg Intravenous Q6H PRN Jesusita Oka, MD      . multivitamin with minerals tablet 1 tablet  1 tablet Oral Daily Lovick, Montel Culver, MD      . ondansetron (ZOFRAN-ODT) disintegrating tablet 4 mg  4 mg Oral Q6H PRN Jesusita Oka, MD       Or  . ondansetron (ZOFRAN) injection 4 mg  4 mg Intravenous Q6H PRN Jesusita Oka, MD   4 mg at 05/27/19 1318  . polyethylene glycol (MIRALAX / GLYCOLAX) packet 17 g  17 g Oral Daily Jesusita Oka, MD   17 g at 05/27/19 1020  . sodium chloride (hypertonic) 3 % solution   Intravenous Continuous Donnamae Jude, RPH 30 mL/hr at 05/28/19 0826 30 mL/hr at 05/28/19 0826  . sodium chloride tablet 2 g  2 g Oral TID WC Saverio Danker, PA-C      . traMADol Veatrice Bourbon) tablet 50-100 mg  50-100 mg Oral Q6H PRN Jesusita Oka, MD   50 mg at 05/27/19 2052  . Vitamin D (Ergocalciferol) (DRISDOL) capsule 50,000 Units  50,000 Units Oral Weekly Saverio Danker, PA-C   50,000 Units at 05/27/19 1020     Discharge Medications: Please see discharge summary for a list of discharge medications.  Relevant Imaging Results:  Relevant Lab Results:   Additional Information SS [id number]    Reinaldo Raddle, RN, BSN  Trauma/Neuro ICU Case  Manager [phone]

## 2019-05-28 NOTE — Progress Notes (Addendum)
Progress Note  Patient Name: [name] [name] Date of Encounter: 05/28/2019  Primary Cardiologist: Jenkins Rouge, MD   Subjective   Pt resting comfortably today with no complaint. Denies chest pain or SOB. Hopeful for surgery today   Inpatient Medications    Scheduled Meds: . acetaminophen  1,000 mg Oral Q6H  . amiodarone  200 mg Oral Daily  . atorvastatin  10 mg Oral QODAY  . budesonide  6 mg Oral Daily  . docusate sodium  100 mg Oral BID  . DULoxetine  30 mg Oral Daily  . feeding supplement  1 Container Oral TID BM  . insulin aspart  0-15 Units Subcutaneous TID WC  . insulin glargine  7 Units Subcutaneous Daily  . lidocaine  10 mL Infiltration Once  . lisinopril  5 mg Oral Daily  . magnesium citrate  1 Bottle Oral Once  . methocarbamol  1,000 mg Oral Q8H  . mirtazapine  7.5 mg Oral QHS  . multivitamin with minerals  1 tablet Oral Daily  . polyethylene glycol  17 g Oral Daily  . sodium chloride  2 g Oral TID WC  . Vitamin D (Ergocalciferol)  50,000 Units Oral Weekly   Continuous Infusions: . sodium chloride 75 mL/hr at 05/27/19 1600  . sodium chloride (hypertonic)     PRN Meds: bisacodyl, morphine injection, ondansetron **OR** ondansetron (ZOFRAN) IV, traMADol   Vital Signs    Vitals:   05/27/19 0828 05/27/19 1230 05/27/19 2049 05/28/19 0439  BP: 113/78 137/75 (!) 134/48 121/85  Pulse: (!) 55 (!) 56 (!) 49 80  Resp: 17 16 18 17   Temp: 98.2 F (36.8 C) 97.8 F (36.6 C) 97.8 F (36.6 C) 98 F (36.7 C)  TempSrc:  Oral Oral Oral  SpO2: 91% 96% 96% 94%  Weight:      Height:        Intake/Output Summary (Last 24 hours) at 05/28/2019 0714 Last data filed at 05/28/2019 0442 Gross per 24 hour  Intake 1387.5 ml  Output 1075 ml  Net 312.5 ml   Filed Weights   05/24/19 2306  Weight: 40.8 kg    Physical Exam   General: Frail, NAD Neck: Negative for carotid bruits. No JVD Lungs:Clear to ausculation bilaterally. No wheezes, rales, or rhonchi. Breathing  is unlabored. Cardiovascular: RRR with S1 S2. No murmurs Extremities: No edema. Radial pulses 2+ bilaterally Neuro: Alert and oriented. No focal deficits. No facial asymmetry. MAE spontaneously. Psych: Responds to questions appropriately with normal affect.    Labs    Chemistry Recent Labs  Lab 05/24/19 2256 05/24/19 2256 05/26/19 0236 05/27/19 1510 05/28/19 0104  NA 136   < > 129* 123* 125*  K 4.9   < > 4.9 4.9 5.0  CL 104   < > 96* 90* 98  CO2 19*   < > 22 21* 17*  GLUCOSE 145*   < > 180* 151* 154*  BUN 30*   < > 26* 26* 21  CREATININE 0.83   < > 0.84 0.80 0.73  CALCIUM 9.5   < > 9.1 8.5* 8.3*  PROT 7.3  --   --   --   --   ALBUMIN 4.1  --   --   --   --   AST 55*  --   --   --   --   ALT 43  --   --   --   --   ALKPHOS 151*  --   --   --   --  BILITOT 0.9  --   --   --   --   GFRNONAA >60   < > >60 >60 >60  GFRAA >60   < > >60 >60 >60  ANIONGAP 13   < > 11 12 10    < > = values in this interval not displayed.    Hematology Recent Labs  Lab 05/26/19 0236 05/27/19 1510 05/28/19 0104  WBC 10.0 9.3 9.7  RBC 4.57 4.44 4.18  HGB 12.5 12.2 11.3*  HCT 39.2 39.0 35.6*  MCV 85.8 87.8 85.2  MCH 27.4 27.5 27.0  MCHC 31.9 31.3 31.7  RDW 14.6 14.6 14.4  PLT 219 239 212    Cardiac EnzymesNo results for input(s): TROPONINI in the last 168 hours. No results for input(s): TROPIPOC in the last 168 hours.   BNPNo results for input(s): BNP, PROBNP in the last 168 hours.   DDimer No results for input(s): DDIMER in the last 168 hours.   Radiology    ECHOCARDIOGRAM COMPLETE  Result Date: 05/27/2019    ECHOCARDIOGRAM REPORT   Patient Name:   MICELLE GESSLER Date of Exam: 05/26/2019 Medical Rec #:  [id number]       Height:       60.0 in Accession #:    [id number]      Weight:       90.0 lb Date of Birth:  [birth year]        BSA:          1.329 m Patient Age:    73 years        BP:           104/61 mmHg Patient Gender: F               HR:           57 bpm. Exam Location:  Inpatient  Procedure: 2D Echo Indications:    Aortic Stenosis 424.1 / 135.0  History:        Patient has prior history of Echocardiogram examinations, most                 recent 07/13/2015. CAD, Stroke and TIA, Arrythmias:Atrial                 Fibrillation, Signs/Symptoms:Murmur; Risk Factors:Hypertension.  Sonographer:    Darlina Sicilian RDCS Referring Phys: [id number] [place]  1. Left ventricular ejection fraction, by estimation, is 55 to 60%. The left ventricle has normal function. The left ventricle has no regional wall motion abnormalities. There is moderate left ventricular hypertrophy. Left ventricular diastolic parameters are indeterminate.  2. Right ventricular systolic function is mildly reduced. The right ventricular size is normal. There is mildly elevated pulmonary artery systolic pressure. The estimated right ventricular systolic pressure is 123XX123 mmHg.  3. Left atrial size was severely dilated.  4. The mitral valve is abnormal. Severe leaflet thickening. Severe mitral annular calfications. Moderate mitral valve regurgitation.  5. Tricuspid valve regurgitation is moderate.  6. There is a 23 mm Edwards Sapien prosthetic, stented (TAVR) valve present in the aortic position. Aortic valve regurgitation is mild. Echo findings are consistent with normal structure and function of the aortic valve prosthesis. Mean gradient 15mmHg.  7. Aortic dilatation noted. There is dilatation of the ascending aorta measuring 40 mm.  8. The inferior vena cava is normal in size with <50% respiratory variability, suggesting right atrial pressure of 8 mmHg. FINDINGS  Left Ventricle: Left ventricular ejection fraction, by estimation, is 55 to  60%. The left ventricle has normal function. The left ventricle has no regional wall motion abnormalities. The left ventricular internal cavity size was small. There is moderate  left ventricular hypertrophy. Left ventricular diastolic parameters are indeterminate. Right Ventricle: The  right ventricular size is normal. No increase in right ventricular wall thickness. Right ventricular systolic function is mildly reduced. There is mildly elevated pulmonary artery systolic pressure. The tricuspid regurgitant velocity  is 2.53 m/s, and with an assumed right atrial pressure of 8 mmHg, the estimated right ventricular systolic pressure is 123XX123 mmHg. Left Atrium: Left atrial size was severely dilated. Right Atrium: Right atrial size was normal in size. Pericardium: There is no evidence of pericardial effusion. Presence of pericardial fat pad. Mitral Valve: The mitral valve is abnormal. There is severe thickening of the mitral valve leaflet(s). Severe mitral annular calcification. Moderate mitral valve regurgitation. MV peak gradient, 3.7 mmHg. The mean mitral valve gradient is 1.0 mmHg. Tricuspid Valve: The tricuspid valve is normal in structure. Tricuspid valve regurgitation is moderate. Aortic Valve: The aortic valve has been repaired/replaced. Aortic valve regurgitation is mild. Aortic regurgitation PHT measures 942 msec. Aortic valve mean gradient measures 3.0 mmHg. Aortic valve peak gradient measures 6.0 mmHg. Aortic valve area, by VTI measures 2.33 cm. There is a 23 mm Edwards Sapien prosthetic, stented (TAVR) valve present in the aortic position. Echo findings are consistent with normal structure and function of the aortic valve prosthesis. Pulmonic Valve: The pulmonic valve was not well visualized. Pulmonic valve regurgitation is not visualized. Aorta: Aortic dilatation noted. There is dilatation of the ascending aorta measuring 40 mm. Venous: The inferior vena cava is normal in size with less than 50% respiratory variability, suggesting right atrial pressure of 8 mmHg. IAS/Shunts: The interatrial septum was not well visualized.  LEFT VENTRICLE PLAX 2D LVIDd:         2.90 cm LVIDs:         2.20 cm LV PW:         1.40 cm LV IVS:        1.50 cm LVOT diam:     2.20 cm LV SV:         44 LV SV Index:    33 LVOT Area:     3.80 cm  RIGHT VENTRICLE RV S prime:     7.95 cm/s LEFT ATRIUM              Index       RIGHT ATRIUM           Index LA diam:        4.70 cm  3.54 cm/m  RA Area:     12.60 cm LA Vol (A2C):   83.2 ml  62.58 ml/m RA Volume:   25.00 ml  18.80 ml/m LA Vol (A4C):   117.0 ml 88.01 ml/m LA Biplane Vol: 98.0 ml  73.72 ml/m  AORTIC VALVE AV Area (Vmax):    2.46 cm AV Area (Vmean):   2.74 cm AV Area (VTI):     2.33 cm AV Vmax:           122.00 cm/s AV Vmean:          75.400 cm/s AV VTI:            0.189 m AV Peak Grad:      6.0 mmHg AV Mean Grad:      3.0 mmHg LVOT Vmax:         79.00 cm/s LVOT Vmean:  54.300 cm/s LVOT VTI:          0.116 m LVOT/AV VTI ratio: 0.61 AI PHT:            942 msec  AORTA Ao Root diam: 2.20 cm Ao Asc diam:  4.00 cm MITRAL VALVE               TRICUSPID VALVE MV Area (PHT): 2.94 cm    TR Peak grad:   25.6 mmHg MV Peak grad:  3.7 mmHg    TR Vmax:        253.00 cm/s MV Mean grad:  1.0 mmHg MV Vmax:       0.97 m/s    SHUNTS MV Vmean:      42.2 cm/s   Systemic VTI:  0.12 m MV Decel Time: 258 msec    Systemic Diam: 2.20 cm MR PISA:        1.01 cm MR PISA Radius: 0.40 cm MV E velocity: 91.10 cm/s Oswaldo Milian MD Electronically signed by Oswaldo Milian MD Signature Date/Time: 05/27/2019/2:11:20 AM    Final    Telemetry    05/28/19 NSR with rate in the 70's - Personally Reviewed  ECG    No new tracing as of 05/28/19- Personally Reviewed  Cardiac Studies   Echo 05/26/19:   IMPRESSIONS    1. Left ventricular ejection fraction, by estimation, is 55 to 60%. The  left ventricle has normal function. The left ventricle has no regional  wall motion abnormalities. There is moderate left ventricular hypertrophy.  Left ventricular diastolic  parameters are indeterminate.  2. Right ventricular systolic function is mildly reduced. The right  ventricular size is normal. There is mildly elevated pulmonary artery  systolic pressure. The estimated  right ventricular systolic pressure is  123XX123 mmHg.  3. Left atrial size was severely dilated.  4. The mitral valve is abnormal. Severe leaflet thickening. Severe mitral  annular calfications. Moderate mitral valve regurgitation.  5. Tricuspid valve regurgitation is moderate.  6. There is a 23 mm Edwards Sapien prosthetic, stented (TAVR) valve  present in the aortic position. Aortic valve regurgitation is mild. Echo  findings are consistent with normal structure and function of the aortic  valve prosthesis. Mean gradient 71mmHg.  7. Aortic dilatation noted. There is dilatation of the ascending aorta  measuring 40 mm.  8. The inferior vena cava is normal in size with <50% respiratory  variability, suggesting right atrial pressure of 8 mmHg.   Patient Profile     86 y.o. female with a hx of persistent Afib, failed Tikosyn,DM2,HTN, subclavian stenosis on the right, HLD, orthostatic hypotension, unsteady gait,CVA, PVD, AS s/p [id number] at [facility], cardiomyopathy EF 45%,mechanical fall 03/2016 with fractured pelviswho was seen for the evaluation of pre-op evaluationat the request of Trauma.  Assessment & Plan    1. Pre-op evaluation forpossible repair ofL distal radial fx:  -Patient presented after mechanical fall found to have sternal fx, T7/L4/L5 compression fxs, L distal radius fracture. Neurosurgery consulted for thoracic and lumbar fxs and felt no surgical intervention was needed. Recommended lumbar corset brace with follow-up.  -Plan for L distal radial fx plan for possible operative repair today.  -No plans for further cardiac evaluation  2. Persistent Afib:  -s/p multiple failed DCCV,Tikosyn and flecainide.  -Last seen in the Afib clinic 05/2017. [facility] 200 mg daily. -CHADSVASC7(agex2, female, stroke x 2, HTN, DM2) and has been on coumadin prior to admission. INR 2.3 today with  Vitamin K ordered for reversal   -Will  need to re-address [facility] as she is likely not a good candidate given recurrent falls.   3. Bradycardia/LBBB:  -LBBBchronic, seen onprevious EKGs -Bradycardia noted to be an issue addressed by [facility] cards in the past. She had an event monitor showing bradycardia with pauses and her BB was continued which seemed to resolve the issue. With her age, TAVR and LBBB may need a PPM in the future.   4. Aortic Stenosis:  -s/p TAVR at [facility] -Echo this admission with normal EF, stable TAVR. Mean gradient of 68mmHg.   5. HTN:  -Continue lisinopril 5 mg daily with stable BP  6. HLD:  -Continue Atorvastatin 10mg  daily  7. Hyponatremia: -Na+ found to be 125 this AM therefore patient was transferred to ICU for hypertonic fluids -Stable mentation   Signed, Kathyrn Drown NP-C [facility] Pager: [phone] 05/28/2019, 7:14 AM    I have examined the patient and reviewed assessment and plan and discussed with patient.  Agree with above as stated.  HR higher today.  I spoke to her PMD, Dr. Raelene Bott about her long term anticoagulation given that she has had multiple falls.  Coumadin on hold for now for surgery.  On my exam, she reported some buring at the site of the right forearm IV.  It was swollen and red, and appeared to be infiltrated.  I informed the nurse and she addressed this immediately.   Hypertonic saline was started for hyponatremia.  May need fluid restriction. Not on HCTZ.  Being followed with serial labs.  Larae Grooms  For questions or updates, please contact   Please consult [email or web address] for contact info under Cardiology/STEMI.

## 2019-05-28 NOTE — Transfer of Care (Signed)
Immediate Anesthesia Transfer of Care Note  Patient: JAYDIN JALOMO  Procedure(s) Performed: OPEN REDUCTION INTERNAL FIXATION (ORIF) DISTAL RADIAL FRACTURE (Left Wrist)  Patient Location: PACU  Anesthesia Type:MAC and Regional  Level of Consciousness: awake  Airway & Oxygen Therapy: Patient Spontanous Breathing and Patient connected to face mask oxygen  Post-op Assessment: Report given to RN and Post -op Vital signs reviewed and stable  Post vital signs: Reviewed  Last Vitals:  Vitals Value Taken Time  BP 158/77 05/28/19 1538  Temp    Pulse 32 05/28/19 1544  Resp 20 05/28/19 1544  SpO2 74 % 05/28/19 1544  Vitals shown include unvalidated device data.  Last Pain:  Vitals:   05/28/19 1200  TempSrc: Oral  PainSc: 0-No pain         Complications: No apparent anesthesia complications

## 2019-05-29 LAB — BASIC METABOLIC PANEL
Anion gap: 10 (ref 5–15)
BUN: 13 mg/dL (ref 8–23)
CO2: 19 mmol/L — ABNORMAL LOW (ref 22–32)
Calcium: 8 mg/dL — ABNORMAL LOW (ref 8.9–10.3)
Chloride: 107 mmol/L (ref 98–111)
Creatinine, Ser: 0.74 mg/dL (ref 0.44–1.00)
GFR calc Af Amer: >60 mL/min (ref >60)
GFR calc non Af Amer: >60 mL/min (ref >60)
Glucose, Bld: 112 mg/dL — ABNORMAL HIGH (ref 70–99)
Potassium: 3.8 mmol/L (ref 3.5–5.1)
Sodium: 136 mmol/L (ref 135–145)

## 2019-05-29 LAB — GLUCOSE, CAPILLARY
Glucose-Capillary: 132 mg/dL — ABNORMAL HIGH (ref 70–99)
Glucose-Capillary: 133 mg/dL — ABNORMAL HIGH (ref 70–99)
Glucose-Capillary: 138 mg/dL — ABNORMAL HIGH (ref 70–99)
Glucose-Capillary: 136 mg/dL — ABNORMAL HIGH (ref 70–99)

## 2019-05-29 NOTE — Progress Notes (Signed)
Patient ID: [name] [name], female   DOB: 1929/10/01, 86 y.o.   MRN: [id number] [facility] Surgery Progress Note:   1 Day Post-Op  Subjective: Mental status is fairly alert and able to answer questions.   Objective: Vital signs in last 24 hours: Temp:  [97 F (36.1 C)-98 F (36.7 C)] 98 F (36.7 C) (03/20 0444) Pulse Rate:  [62-81] 76 (03/20 0444) Resp:  [12-26] 17 (03/20 0444) BP: (105-164)/(57-106) 114/71 (03/20 0444) SpO2:  [96 %-100 %] 98 % (03/20 0444) Weight:  [41.3 kg] 41.3 kg (03/19 1758)  Intake/Output from previous day: 03/19 0701 - 03/20 0700 In: 656.3 [I.V.:556.3; IV Piggyback:100] Out: 1270 [Urine:1250; Blood:20] Intake/Output this shift: No intake/output data recorded.  Physical Exam: Work of breathing is not labored.  Big breakfast that she has nibbled upon.  Appetite poor.  Complaining of back pain from lumbar compression; wrist splinted.    Lab Results:  Results for orders placed or performed during the hospital encounter of 05/24/19 (from the past 48 hour(s))  Glucose, capillary     Status: Abnormal   Collection Time: 05/27/19 12:04 PM  Result Value Ref Range   Glucose-Capillary 204 (H) 70 - 99 mg/dL    Comment: Glucose reference range applies only to samples taken after fasting for at least 8 hours.  CBC     Status: None   Collection Time: 05/27/19  3:10 PM  Result Value Ref Range   WBC 9.3 4.0 - 10.5 K/uL   RBC 4.44 3.87 - 5.11 MIL/uL   Hemoglobin 12.2 12.0 - 15.0 g/dL   HCT 39.0 36.0 - 46.0 %   MCV 87.8 80.0 - 100.0 fL   MCH 27.5 26.0 - 34.0 pg   MCHC 31.3 30.0 - 36.0 g/dL   RDW 14.6 11.5 - 15.5 %   Platelets 239 150 - 400 K/uL   nRBC 0.0 0.0 - 0.2 %    Comment: Performed at [facility], [place] [address], [place], [place] [id number]  Basic metabolic panel     Status: Abnormal   Collection Time: 05/27/19  3:10 PM  Result Value Ref Range   Sodium 123 (L) 135 - 145 mmol/L   Potassium 4.9 3.5 - 5.1 mmol/L   Chloride 90 (L) 98 - 111 mmol/L   CO2 21 (L) 22 - 32 mmol/L   Glucose, Bld 151 (H) 70 - 99 mg/dL    Comment: Glucose reference range applies only to samples taken after fasting for at least 8 hours.   BUN 26 (H) 8 - 23 mg/dL   Creatinine, Ser 0.80 0.44 - 1.00 mg/dL   Calcium 8.5 (L) 8.9 - 10.3 mg/dL   GFR calc non Af Amer >60 >60 mL/min   GFR calc Af Amer >60 >60 mL/min   Anion gap 12 5 - 15    Comment: Performed at [facility] [address] [place], [place] [address]  Protime-INR     Status: Abnormal   Collection Time: 05/27/19  3:40 PM  Result Value Ref Range   Prothrombin Time 19.6 (H) 11.4 - 15.2 seconds   INR 1.7 (H) 0.8 - 1.2    Comment: (NOTE) INR goal varies based on device and disease states. Performed at [facility], [place] [address] [place], [place] [address]   Glucose, capillary     Status: Abnormal   Collection Time: 05/27/19  4:32 PM  Result Value Ref Range   Glucose-Capillary 137 (H) 70 - 99 mg/dL  Comment: Glucose reference range applies only to samples taken after fasting for at least 8 hours.  Surgical pcr screen     Status: None   Collection Time: 05/27/19  6:05 PM   Specimen: Nasal Mucosa; Nasal Swab  Result Value Ref Range   MRSA, PCR NEGATIVE NEGATIVE   Staphylococcus aureus NEGATIVE NEGATIVE    Comment: (NOTE) The Xpert SA Assay (FDA approved for NASAL specimens in patients 74 years of age and older), is one component of a comprehensive surveillance program. It is not intended to diagnose infection nor to guide or monitor treatment. Performed at [facility], [place] [address], [place], [place] [address]   Glucose, capillary     Status: Abnormal   Collection Time: 05/27/19  8:40 PM  Result Value Ref Range   Glucose-Capillary 139 (H) 70 - 99 mg/dL    Comment: Glucose reference range applies only to samples taken after fasting for at least 8 hours.  Protime-INR     Status: Abnormal   Collection Time: 05/28/19  1:04 AM  Result Value Ref Range   Prothrombin Time  15.5 (H) 11.4 - 15.2 seconds   INR 1.2 0.8 - 1.2    Comment: (NOTE) INR goal varies based on device and disease states. Performed at [facility], [place] [address], [place], [place] [address]   CBC     Status: Abnormal   Collection Time: 05/28/19  1:04 AM  Result Value Ref Range   WBC 9.7 4.0 - 10.5 K/uL   RBC 4.18 3.87 - 5.11 MIL/uL   Hemoglobin 11.3 (L) 12.0 - 15.0 g/dL   HCT 35.6 (L) 36.0 - 46.0 %   MCV 85.2 80.0 - 100.0 fL   MCH 27.0 26.0 - 34.0 pg   MCHC 31.7 30.0 - 36.0 g/dL   RDW 14.4 11.5 - 15.5 %   Platelets 212 150 - 400 K/uL   nRBC 0.0 0.0 - 0.2 %    Comment: Performed at [facility], [place] [address] [place], [place] [id number]  Basic metabolic panel     Status: Abnormal   Collection Time: 05/28/19  1:04 AM  Result Value Ref Range   Sodium 125 (L) 135 - 145 mmol/L   Potassium 5.0 3.5 - 5.1 mmol/L   Chloride 98 98 - 111 mmol/L   CO2 17 (L) 22 - 32 mmol/L   Glucose, Bld 154 (H) 70 - 99 mg/dL    Comment: Glucose reference range applies only to samples taken after fasting for at least 8 hours.   BUN 21 8 - 23 mg/dL   Creatinine, Ser 0.73 0.44 - 1.00 mg/dL   Calcium 8.3 (L) 8.9 - 10.3 mg/dL   GFR calc non Af Amer >60 >60 mL/min   GFR calc Af Amer >60 >60 mL/min   Anion gap 10 5 - 15    Comment: Performed at [facility] [address] [place],  [address]  Glucose, capillary     Status: Abnormal   Collection Time: 05/28/19  8:50 AM  Result Value Ref Range   Glucose-Capillary 132 (H) 70 - 99 mg/dL    Comment: Glucose reference range applies only to samples taken after fasting for at least 8 hours.  Osmolality, urine     Status: Abnormal   Collection Time: 05/28/19  9:31 AM  Result Value Ref Range   Osmolality, Ur 247 (L) 300 - 900 mOsm/kg    Comment: Performed at [facility], [address] [place], [place] [address]  Sodium, urine, random     Status: None   Collection Time: 05/28/19  9:31 AM  Result Value Ref Range   Sodium,  Ur 75 mmol/L    Comment: Performed at [place] [facility] [address], [place], [place] [address]  Glucose, capillary     Status: Abnormal   Collection Time: 05/28/19 11:46 AM  Result Value Ref Range   Glucose-Capillary 128 (H) 70 - 99 mg/dL    Comment: Glucose reference range applies only to samples taken after fasting for at least 8 hours.  Basic metabolic panel     Status: Abnormal   Collection Time: 05/28/19 12:40 PM  Result Value Ref Range   Sodium 132 (L) 135 - 145 mmol/L   Potassium 4.4 3.5 - 5.1 mmol/L   Chloride 100 98 - 111 mmol/L   CO2 19 (L) 22 - 32 mmol/L   Glucose, Bld 135 (H) 70 - 99 mg/dL    Comment: Glucose reference range applies only to samples taken after fasting for at least 8 hours.   BUN 14 8 - 23 mg/dL   Creatinine, Ser 0.61 0.44 - 1.00 mg/dL   Calcium 8.6 (L) 8.9 - 10.3 mg/dL   GFR calc non Af Amer >60 >60 mL/min   GFR calc Af Amer >60 >60 mL/min   Anion gap 13 5 - 15    Comment: Performed at [facility] [address], [place], [place] [address]  Glucose, capillary     Status: Abnormal   Collection Time: 05/28/19  2:04 PM  Result Value Ref Range   Glucose-Capillary 121 (H) 70 - 99 mg/dL    Comment: Glucose reference range applies only to samples taken after fasting for at least 8 hours.   Comment 1 Notify RN    Comment 2 Document in Chart   Glucose, capillary     Status: Abnormal   Collection Time: 05/28/19  4:49 PM  Result Value Ref Range   Glucose-Capillary 134 (H) 70 - 99 mg/dL    Comment: Glucose reference range applies only to samples taken after fasting for at least 8 hours.  Glucose, capillary     Status: Abnormal   Collection Time: 05/28/19  8:46 PM  Result Value Ref Range   Glucose-Capillary 139 (H) 70 - 99 mg/dL    Comment: Glucose reference range applies only to samples taken after fasting for at least 8 hours.  Basic metabolic panel     Status: Abnormal   Collection Time: 05/29/19  3:17 AM  Result Value Ref Range   Sodium 136  135 - 145 mmol/L   Potassium 3.8 3.5 - 5.1 mmol/L   Chloride 107 98 - 111 mmol/L   CO2 19 (L) 22 - 32 mmol/L   Glucose, Bld 112 (H) 70 - 99 mg/dL    Comment: Glucose reference range applies only to samples taken after fasting for at least 8 hours.   BUN 13 8 - 23 mg/dL   Creatinine, Ser 0.74 0.44 - 1.00 mg/dL   Calcium 8.0 (L) 8.9 - 10.3 mg/dL   GFR calc non Af Amer >60 >60 mL/min   GFR calc Af Amer >60 >60 mL/min   Anion gap 10 5 - 15    Comment: Performed at [facility] [address], [place], [place] [address]  Glucose, capillary     Status: Abnormal   Collection Time: 05/29/19  7:57 AM  Result Value Ref Range  Glucose-Capillary 132 (H) 70 - 99 mg/dL    Comment: Glucose reference range applies only to samples taken after fasting for at least 8 hours.    Radiology/Results: DG MINI C-ARM IMAGE ONLY  Result Date: 05/28/2019 There is no interpretation for this exam.  This order is for images obtained during a surgical procedure.  Please See "Surgeries" Tab for more information regarding the procedure.    Anti-infectives: Anti-infectives (From admission, onward)   Start     Dose/Rate Route Frequency Ordered Stop   05/28/19 0900  cefTRIAXone (ROCEPHIN) 1 g in sodium chloride 0.9 % 100 mL IVPB     1 g 200 mL/hr over 30 Minutes Intravenous Every 24 hours 05/28/19 0802 05/31/19 0859      Assessment/Plan: Problem List: Patient Active Problem List   Diagnosis Date Noted  . Malnutrition of moderate degree 05/28/2019  . Pressure injury of skin 05/27/2019  . Compressed spine fracture ([place]) 05/25/2019  . Near syncope 06/17/2015  . Pre-syncope 06/17/2015  . Anticoagulant long-term use 08/27/2010  . Chest pain, unspecified 06/29/2010  . MURMUR 05/17/2009  . VASCULITIS 11/23/2008  . HYPERCHOLESTEROLEMIA 05/25/2008  . Essential hypertension 05/25/2008  . ATRIAL FIBRILLATION 05/25/2008  . CVA 05/25/2008  . TIA 05/25/2008  . GIANT CELL ARTERITIS 05/25/2008  . FATIGUE  05/25/2008  . Palpitations 05/25/2008  . DYSPNEA 05/25/2008    Electrolytes look good.  Much trauma for 86 year old --continue supportive care and trying to increase caloric intake.   1 Day Post-Op    LOS: 4 days   Matt B. Hassell Done, MD, [facility] [phone] beeper [phone]  05/29/2019 9:37 AM

## 2019-05-29 NOTE — TOC Progression Note (Signed)
Transition of Care [facility]) - Progression Note    Patient Details  Name: ORVA WEHMEYER MRN: [id number] Date of Birth: 23-Aug-1929  Transition of Care [facility]) CM/SW Contact  Jazzmin Newbold, [place], [place] Phone Number: 05/29/2019, 1:53 PM  Clinical Narrative:    Phone call to patient's daughter, bed offers provided by phone and by secure e-mail. Patient's daughter would like to review bed offers with her brother before a final decision is made. Patient's daughter encouraged to visit the Medicare.gov sight to review the star rating for each facility listed.   Izabellah Dadisman, LCSW Clinical Social Work [phone]     Expected Discharge Plan: [place] Barriers to Discharge: Continued Medical Work up  Expected Discharge Plan and Services Expected Discharge Plan: [place]   Discharge Planning Services: CM Consult Post Acute Care Choice: [place] Living arrangements for the past 2 months: Single Family Home                                       Social Determinants of Health (SDOH) Interventions    Readmission Risk Interventions No flowsheet data found.

## 2019-05-29 NOTE — Op Note (Signed)
NAME: [name] [name], HULL MEDICAL RECORD [id number] ACCOUNT [id number] DATE OF BIRTH:1929-04-17 FACILITY: [facility] LOCATION: [place], [place]  OPERATIVE REPORT  DATE OF PROCEDURE:  05/28/2019  PREOPERATIVE DIAGNOSIS:  Closed displaced fracture of the left distal radius.  POSTOPERATIVE DIAGNOSIS:  Closed displaced fracture of the left distal radius.  PROCEDURE:  Open reduction internal fixation of the left distal radius with a [employer] volar plate.  ANESTHESIA:  Axillary block plus IV sedation.  ESTIMATED BLOOD LOSS:  Minimal.  COMPLICATIONS:  No acute complications.  INDICATIONS:  The patient is an 86 year old female who suffered from a fall recently, sustaining a displaced fracture to her left distal radius.  She was cleared from a medical point of view and scheduled for surgery.  Risks, benefits and alternatives of  surgery were discussed with her and her family.  They agreed with these risks and consent was obtained.  DESCRIPTION OF PROCEDURE:  The patient was taken to the operating room after an axillary block had been performed by anesthesia.  She was placed supine on the operating room table.  IV anesthesia was administered.  The left upper extremity was prepped  and draped in normal sterile fashion.  Timeout was performed.  There were no preoperative antibiotics given because the patient was on antibiotics.  The left upper extremity was prepped and draped in normal sterile fashion.  The arm was exsanguinated.   Tourniquet was used on the upper arm, inflated to 250 mmHg.  An incision on the volar wrist overlying the FCR tendon was then made.  Dissection between the FCR tendon and the radial artery was performed down to the pronator quadratus muscle.  The  pronator quadratus muscle was taken down in a hockey stick type fashion, exposing the fracture site.  The fracture was extraarticular and quite easily reduced.  An appropriate sized volar plate was chosen and held  temporarily in place with K wires while  x-rays confirmed adequate plate placement.  Following, each of the radial shaft screws were each drilled.  The screw lengths were measured and appropriate cortical screws were placed.  X-rays revealed good screw length.  Next, the distal radial head  holes were each drilled.  Each measured and appropriate-sized locking smooth pegs were placed.  Final x-rays revealed near anatomic reduction with excellent screw length and reduction.  The wound was then irrigated with irrigation solution.  The deep  fascia was closed with interrupted 3-0 Vicryl.  The skin was closed with a running 4-0 Monocryl.  A volar fiberglass splint was placed.  Tourniquet was released.  All fingers returned to a nice pink color.  The patient tolerated the procedure well and  was taken to recovery room in stable condition.  LN/NUANCE  D:05/28/2019 T:05/29/2019 JOB:[id number]

## 2019-05-29 NOTE — Plan of Care (Signed)

## 2019-05-29 NOTE — Progress Notes (Signed)
Physical Therapy Treatment Patient Details Name: [name] [name] MRN: [id number] DOB: 10/14/1929 Today's Date: 05/29/2019    History of Present Illness Pt is 86 yo female admitted s/p fall.  Pt with T7/L4/L5 compression fx with no retropulsion managed with corset LSO when OOB, L distal radius fx with splint and NWB (pt now s/p ORIF 3/19), and sternal fracture.  Pt with PMH including arthritis, afib, back pain, CA, CAD, CVA depression, HTN, HCL, cardiac valve replacement 6/18.  Pt with 4-5 falls in last 6 months per H and P.    PT Comments    Pt in bed upon arrival of PT/OT, agreeable to participate in PT session with focus on progressing mobility and independence while maintaining precautions. The pt continues to present with limitations in functional mobility, static and dynamic stability, and activity tolerance compared to their prior level of function and independence due to above dx. The pt was able to demo short bouts of ambulation in the room with HHA of 2 people, but requires modA of 2 due to poor standing posture, strong posterior lean, and a tendency to sit prior to arriving fully at chair/bed. The pt also demonstrates difficulty with maintaining NWB LUE and required attention of one person through each movement to give multimodal cues and reminders to not use her LUE to push. The pt's confusion limits her ability to maintain precautions or corrections without repeated reminders, and will therefore benefit from continued skilled PT acutely and after d/c to maximize safety and independence with functional mobility.     Follow Up Recommendations  SNF     Equipment Recommendations  (defer to post acute)    Recommendations for Other Services       Precautions / Restrictions Precautions Precautions: Fall;Back Precaution Booklet Issued: No Precaution Comments: verbal discussion of back precautions Required Braces or Orthoses: Splint/Cast;Spinal Brace Spinal Brace: Lumbar corset;Applied  in sitting position Splint/Cast: LUE casted below elbow to MCPs Restrictions Weight Bearing Restrictions: Yes LUE Weight Bearing: Non weight bearing Other Position/Activity Restrictions: LUE NWB prior to ORIF, no WB status given following procedure, so NWB still assumed. Pt with poor complicance, needing verbal and tactile reminders with every movement    Mobility  Bed Mobility Overal bed mobility: Needs Assistance Bed Mobility: Supine to Sit     Supine to sit: Min assist;+2 for safety/equipment;HOB elevated     General bed mobility comments: cues for log roll, but minA to complete movements as well as to maintain NWB LUE, then minA needed to raise trunk from elevated HOB  Transfers Overall transfer level: Needs assistance Equipment used: 2 person hand held assist(HHA of 2 due to lack of platform RW) Transfers: Sit to/from Stand Sit to Stand: Mod assist;+2 physical assistance         General transfer comment: pt benefits from modA of 2 with HHA, could be modA of 1 with platform RW for LUE, poor hip extension with stand. pt always attempting to sit too soon, needs cues to remain standing until she is near chair.  Ambulation/Gait Ambulation/Gait assistance: Mod assist;+2 physical assistance Gait Distance (Feet): 15 Feet(15 ft + 10 ft) Assistive device: 2 person hand held assist Gait Pattern/deviations: Decreased stride length;Step-through pattern;Shuffle;Trunk flexed Gait velocity: decreased Gait velocity interpretation: <1.31 ft/sec, indicative of household ambulator General Gait Details: pt able to stand and ambulate with HHA of 2 due to lack of platform RW for LUE. Pt with sig hip flexion, slight improvement with VCs but not maintained, pt fatigues quickly  Stairs             Wheelchair Mobility    Modified Rankin (Stroke Patients Only)       Balance Overall balance assessment: Needs assistance;History of Falls Sitting-balance support: No upper extremity  supported;Feet supported Sitting balance-Leahy Scale: Good Sitting balance - Comments: supervision for NWB status Postural control: Posterior lean Standing balance support: Bilateral upper extremity supported;During functional activity Standing balance-Leahy Scale: Poor Standing balance comment: pt with poor his extension, HHA of 2 with modA and continued cues for posture and LE positioning to maintain upright                            Cognition Arousal/Alertness: Awake/alert Behavior During Therapy: WFL for tasks assessed/performed Overall Cognitive Status: Impaired/Different from baseline Area of Impairment: Following commands;Memory;Awareness;Orientation;Attention;Safety/judgement                 Orientation Level: Disoriented to;Situation;Place(pt telling tangential story about staff members being "late" to help her due to car troubles which is not possible in this setting) Current Attention Level: Focused Memory: Decreased recall of precautions;Decreased short-term memory Following Commands: Follows one step commands with increased time;Follows one step commands inconsistently Safety/Judgement: Decreased awareness of safety;Decreased awareness of deficits Awareness: Intellectual   General Comments: Pt with poor following of commands for NWB LUE, unable to maintain with bed mobiltiy or sit-stand transfers. Pt often telling tangential stories with no clear objective or relation to questions asked/conversation      Exercises      General Comments        Pertinent Vitals/Pain Pain Assessment: Faces Faces Pain Scale: Hurts a little bit Pain Location: L arm Pain Descriptors / Indicators: Discomfort;Grimacing;Sore Pain Intervention(s): Limited activity within patient's tolerance;Monitored during session    Home Living                      Prior Function            PT Goals (current goals can now be found in the care plan section) Acute Rehab PT  Goals Patient Stated Goal: to increase independence PT Goal Formulation: With patient/family Time For Goal Achievement: 06/09/19 Potential to Achieve Goals: Good Progress towards PT goals: Progressing toward goals    Frequency    Min 2X/week      PT Plan Current plan remains appropriate    Co-evaluation PT/OT/SLP Co-Evaluation/Treatment: Yes Reason for Co-Treatment: Necessary to address cognition/behavior during functional activity;For patient/therapist safety;To address functional/ADL transfers PT goals addressed during session: Mobility/safety with mobility;Balance;Proper use of DME;Strengthening/ROM        AM-PAC PT "6 Clicks" Mobility   Outcome Measure  Help needed turning from your back to your side while in a flat bed without using bedrails?: A Little Help needed moving from lying on your back to sitting on the side of a flat bed without using bedrails?: A Lot Help needed moving to and from a bed to a chair (including a wheelchair)?: A Lot Help needed standing up from a chair using your arms (e.g., wheelchair or bedside chair)?: A Little Help needed to walk in hospital room?: A Lot Help needed climbing 3-5 steps with a railing? : Total 6 Click Score: 13    End of Session Equipment Utilized During Treatment: Gait belt;Back brace Activity Tolerance: Patient tolerated treatment well;Patient limited by fatigue Patient left: in chair;with call bell/phone within reach;with family/visitor present Nurse Communication: Mobility status(pt in chair with no  green chair alarm box in room, pt sitting on chair alarm pad, NT notified) PT Visit Diagnosis: Unsteadiness on feet (R26.81);History of falling (Z91.81);Muscle weakness (generalized) (M62.81)     Time: [id number] PT Time Calculation (min) (ACUTE ONLY): 23 min  Charges:  $Gait Training: 8-22 mins                     Karma Ganja, PT, DPT   Acute Rehabilitation Department Pager #: [phone]   Otho Bellows 05/29/2019, 1:12 PM

## 2019-05-29 NOTE — Progress Notes (Signed)
Occupational Therapy Treatment Patient Details Name: [name] [name] MRN: [id number] DOB: 1929-09-03 Today's Date: 05/29/2019    History of present illness Pt is 86 yo female admitted s/p fall.  Pt with T7/L4/L5 compression fx with no retropulsion managed with corset LSO when OOB, L distal radius fx with splint and NWB (pt now s/p ORIF 3/19), and sternal fracture.  Pt with PMH including arthritis, afib, back pain, CA, CAD, CVA depression, HTN, HCL, cardiac valve replacement 6/18.  Pt with 4-5 falls in last 6 months per H and P.   OT comments  Pt seen for OT f/u s/p distal radius ORIF on 3/19. Pt able to complete bed mobility with min A +2 and sit <> stand with mod A _2. Max verbal and tactile cueing needed to maintain LUE NWB precautions. Pt completed functional mobility into bathroom at mod A +2. Increased assist needed for peri care and maintaining back precautions. Educated pt on opening and closing hand lightly to help with swelling- daughter present for education. Pt appears to remain confused to situation, recalling WB precautions, and problem solving. D/c recs remain appropriate. Will continue to follow.   Follow Up Recommendations  SNF;Supervision/Assistance - 24 hour    Equipment Recommendations  None recommended by OT    Recommendations for Other Services      Precautions / Restrictions Precautions Precautions: Fall;Back Precaution Booklet Issued: No Precaution Comments: verbal discussion of back precautions Required Braces or Orthoses: Splint/Cast;Spinal Brace Spinal Brace: Lumbar corset;Applied in sitting position Splint/Cast: LUE casted below elbow to MCPs Restrictions Weight Bearing Restrictions: Yes LUE Weight Bearing: Non weight bearing Other Position/Activity Restrictions: LUE NWB prior to ORIF, no WB status given following procedure, so NWB still assumed. Pt with poor complicance, needing verbal and tactile reminders with every movement       Mobility Bed  Mobility Overal bed mobility: Needs Assistance Bed Mobility: Supine to Sit     Supine to sit: Min assist;+2 for safety/equipment;HOB elevated     General bed mobility comments: cues for log roll, but minA to complete movements as well as to maintain NWB LUE, then minA needed to raise trunk from elevated HOB  Transfers Overall transfer level: Needs assistance Equipment used: 2 person hand held assist Transfers: Sit to/from Stand Sit to Stand: Mod assist;+2 physical assistance         General transfer comment: mod A +2 with side by side HHA to rise and steady. Physical cues needed on LUE to not use in transfers    Balance Overall balance assessment: Needs assistance;History of Falls Sitting-balance support: No upper extremity supported;Feet supported Sitting balance-Leahy Scale: Good Sitting balance - Comments: supervision for NWB status Postural control: Posterior lean Standing balance support: Bilateral upper extremity supported;During functional activity Standing balance-Leahy Scale: Poor Standing balance comment: multimodal cueing needed to maintain upright posture, reliant on external support                           ADL either performed or assessed with clinical judgement   ADL Overall ADL's : Needs assistance/impaired     Grooming: Set up;Cueing for safety;Sitting               Lower Body Dressing: Moderate assistance;Bed level Lower Body Dressing Details (indicate cue type and reason): mod A to fix socks while bed level and bringing feet up to her. Cues given for safety and not using LUE due to NWB precautions Toilet Transfer: Moderate assistance;+2  for physical assistance;+2 for safety/equipment;Cueing for safety;Ambulation Toilet Transfer Details (indicate cue type and reason): mod A +2 for side by side assist for functional mobility to commode in bathroom with BSC placed over top. Cues needed to not use LUE in transfer Toileting- Clothing  Manipulation and Hygiene: Maximal assistance;Cueing for safety;Sitting/lateral lean;Sit to/from stand       Functional mobility during ADLs: Moderate assistance;+2 for physical assistance;+2 for safety/equipment;Cueing for safety General ADL Comments: continues to need verbal cues and reminders to maintain LUE precautions     Vision Baseline Vision/History: No visual deficits Patient Visual Report: No change from baseline     Perception     Praxis      Cognition Arousal/Alertness: Awake/alert Behavior During Therapy: WFL for tasks assessed/performed Overall Cognitive Status: Impaired/Different from baseline Area of Impairment: Following commands;Memory;Awareness;Orientation;Attention;Safety/judgement                 Orientation Level: Disoriented to;Situation;Place(talking about staff being "late" due to helping someone on side of road with car trouble) Current Attention Level: Focused Memory: Decreased recall of precautions;Decreased short-term memory Following Commands: Follows one step commands with increased time;Follows one step commands inconsistently Safety/Judgement: Decreased awareness of safety;Decreased awareness of deficits Awareness: Intellectual   General Comments: needs consistent cues and tactile reminders to maintain LUE NWB status. Pt telling tangential stories without relation to current topic, appearing disoriented and confused        Exercises     Shoulder Instructions       General Comments      Pertinent Vitals/ Pain       Pain Assessment: Faces Faces Pain Scale: Hurts little more Pain Location: back Pain Descriptors / Indicators: Discomfort;Grimacing;Sore Pain Intervention(s): Limited activity within patient's tolerance;Monitored during session  Home Living                                          Prior Functioning/Environment              Frequency           Progress Toward Goals  OT Goals(current  goals can now be found in the care plan section)  Progress towards OT goals: Progressing toward goals  Acute Rehab OT Goals Patient Stated Goal: to increase independence OT Goal Formulation: With patient Time For Goal Achievement: 06/09/19 Potential to Achieve Goals: Good  Plan Discharge plan remains appropriate    Co-evaluation    PT/OT/SLP Co-Evaluation/Treatment: Yes Reason for Co-Treatment: For patient/therapist safety;To address functional/ADL transfers PT goals addressed during session: Mobility/safety with mobility;Balance;Proper use of DME;Strengthening/ROM OT goals addressed during session: ADL's and self-care;Proper use of Adaptive equipment and DME;Strengthening/ROM      AM-PAC OT "6 Clicks" Daily Activity     Outcome Measure   Help from another person eating meals?: A Little Help from another person taking care of personal grooming?: A Little Help from another person toileting, which includes using toliet, bedpan, or urinal?: A Lot Help from another person bathing (including washing, rinsing, drying)?: A Lot Help from another person to put on and taking off regular upper body clothing?: A Little Help from another person to put on and taking off regular lower body clothing?: A Lot 6 Click Score: 15    End of Session Equipment Utilized During Treatment: Gait belt;Rolling walker;Back brace  OT Visit Diagnosis: Unsteadiness on feet (R26.81);Muscle weakness (generalized) (M62.81);Pain;Other symptoms and signs involving  cognitive function Pain - part of body: (back)   Activity Tolerance Patient tolerated treatment well   Patient Left in chair;with call bell/phone within reach;with chair alarm set   Nurse Communication Mobility status;Precautions        Time: [id number] OT Time Calculation (min): 27 min  Charges: OT General Charges $OT Visit: 1 Visit OT Treatments $Self Care/Home Management : 8-22 mins  Zenovia Jarred, MSOT, OTR/L [facility] Office Number: [phone] Pager: [phone]  Zenovia Jarred 05/29/2019, 1:33 PM

## 2019-05-30 LAB — GLUCOSE, CAPILLARY
Glucose-Capillary: 131 mg/dL — ABNORMAL HIGH (ref 70–99)
Glucose-Capillary: 107 mg/dL — ABNORMAL HIGH (ref 70–99)
Glucose-Capillary: 159 mg/dL — ABNORMAL HIGH (ref 70–99)
Glucose-Capillary: 149 mg/dL — ABNORMAL HIGH (ref 70–99)

## 2019-05-30 NOTE — Progress Notes (Signed)
Patient ID: [name] [name], female   DOB: [birth year], 86 y.o.   MRN: [id number] [facility] Progress Note:   2 Days Post-Op  Subjective: Mental status is clear and pleasant Objective: Vital signs in last 24 hours: Temp:  [97.4 F (36.3 C)-98.4 F (36.9 C)] 98.4 F (36.9 C) (03/21 0342) Pulse Rate:  [76-81] 76 (03/21 0342) Resp:  [14-18] 18 (03/21 0342) BP: (103-150)/(68-82) 150/68 (03/21 0342) SpO2:  [95 %-100 %] 95 % (03/21 0342)  Intake/Output from previous day: 03/20 0701 - 03/21 0700 In: 1051.5 [P.O.:120; I.V.:831.5; IV Piggyback:100] Out: 1400 [Urine:1400] Intake/Output this shift: Total I/O In: -  Out: 600 [Urine:600]  Physical Exam: Work of breathing is not labored.  Resting without pain  Lab Results:  Results for orders placed or performed during the hospital encounter of 05/24/19 (from the past 48 hour(s))  Glucose, capillary     Status: Abnormal   Collection Time: 05/28/19 11:46 AM  Result Value Ref Range   Glucose-Capillary 128 (H) 70 - 99 mg/dL    Comment: Glucose reference range applies only to samples taken after fasting for at least 8 hours.  Basic metabolic panel     Status: Abnormal   Collection Time: 05/28/19 12:40 PM  Result Value Ref Range   Sodium 132 (L) 135 - 145 mmol/L   Potassium 4.4 3.5 - 5.1 mmol/L   Chloride 100 98 - 111 mmol/L   CO2 19 (L) 22 - 32 mmol/L   Glucose, Bld 135 (H) 70 - 99 mg/dL    Comment: Glucose reference range applies only to samples taken after fasting for at least 8 hours.   BUN 14 8 - 23 mg/dL   Creatinine, Ser 0.61 0.44 - 1.00 mg/dL   Calcium 8.6 (L) 8.9 - 10.3 mg/dL   GFR calc non Af Amer >60 >60 mL/min   GFR calc Af Amer >60 >60 mL/min   Anion gap 13 5 - 15    Comment: Performed at [facility] [address], [place], [place] [address]  Glucose, capillary     Status: Abnormal   Collection Time: 05/28/19  2:04 PM  Result Value Ref Range   Glucose-Capillary 121 (H) 70 - 99 mg/dL    Comment:  Glucose reference range applies only to samples taken after fasting for at least 8 hours.   Comment 1 Notify RN    Comment 2 Document in Chart   Glucose, capillary     Status: Abnormal   Collection Time: 05/28/19  4:49 PM  Result Value Ref Range   Glucose-Capillary 134 (H) 70 - 99 mg/dL    Comment: Glucose reference range applies only to samples taken after fasting for at least 8 hours.  Glucose, capillary     Status: Abnormal   Collection Time: 05/28/19  8:46 PM  Result Value Ref Range   Glucose-Capillary 139 (H) 70 - 99 mg/dL    Comment: Glucose reference range applies only to samples taken after fasting for at least 8 hours.  Basic metabolic panel     Status: Abnormal   Collection Time: 05/29/19  3:17 AM  Result Value Ref Range   Sodium 136 135 - 145 mmol/L   Potassium 3.8 3.5 - 5.1 mmol/L   Chloride 107 98 - 111 mmol/L   CO2 19 (L) 22 - 32 mmol/L   Glucose, Bld 112 (H) 70 - 99 mg/dL    Comment: Glucose reference range applies only to samples taken after fasting for at least  8 hours.   BUN 13 8 - 23 mg/dL   Creatinine, Ser 0.74 0.44 - 1.00 mg/dL   Calcium 8.0 (L) 8.9 - 10.3 mg/dL   GFR calc non Af Amer >60 >60 mL/min   GFR calc Af Amer >60 >60 mL/min   Anion gap 10 5 - 15    Comment: Performed at [facility] [address], [place], [place] [address]  Glucose, capillary     Status: Abnormal   Collection Time: 05/29/19  7:57 AM  Result Value Ref Range   Glucose-Capillary 132 (H) 70 - 99 mg/dL    Comment: Glucose reference range applies only to samples taken after fasting for at least 8 hours.  Glucose, capillary     Status: Abnormal   Collection Time: 05/29/19 12:27 PM  Result Value Ref Range   Glucose-Capillary 133 (H) 70 - 99 mg/dL    Comment: Glucose reference range applies only to samples taken after fasting for at least 8 hours.   Comment 1 Notify RN    Comment 2 Document in Chart   Glucose, capillary     Status: Abnormal   Collection Time: 05/29/19  5:48 PM   Result Value Ref Range   Glucose-Capillary 138 (H) 70 - 99 mg/dL    Comment: Glucose reference range applies only to samples taken after fasting for at least 8 hours.   Comment 1 Notify RN    Comment 2 Document in Chart   Glucose, capillary     Status: Abnormal   Collection Time: 05/29/19  9:41 PM  Result Value Ref Range   Glucose-Capillary 136 (H) 70 - 99 mg/dL    Comment: Glucose reference range applies only to samples taken after fasting for at least 8 hours.  Glucose, capillary     Status: Abnormal   Collection Time: 05/30/19  8:21 AM  Result Value Ref Range   Glucose-Capillary 131 (H) 70 - 99 mg/dL    Comment: Glucose reference range applies only to samples taken after fasting for at least 8 hours.    Radiology/Results: DG MINI C-ARM IMAGE ONLY  Result Date: 05/28/2019 There is no interpretation for this exam.  This order is for images obtained during a surgical procedure.  Please See "Surgeries" Tab for more information regarding the procedure.    Anti-infectives: Anti-infectives (From admission, onward)   Start     Dose/Rate Route Frequency Ordered Stop   05/28/19 0900  cefTRIAXone (ROCEPHIN) 1 g in sodium chloride 0.9 % 100 mL IVPB     1 g 200 mL/hr over 30 Minutes Intravenous Every 24 hours 05/28/19 0802 05/30/19 [id number]      Assessment/Plan: Problem List: Patient Active Problem List   Diagnosis Date Noted  . Malnutrition of moderate degree 05/28/2019  . Pressure injury of skin 05/27/2019  . Compressed spine fracture ([place]) 05/25/2019  . Near syncope 06/17/2015  . Pre-syncope 06/17/2015  . Anticoagulant long-term use 08/27/2010  . Chest pain, unspecified 06/29/2010  . MURMUR 05/17/2009  . VASCULITIS 11/23/2008  . HYPERCHOLESTEROLEMIA 05/25/2008  . Essential hypertension 05/25/2008  . ATRIAL FIBRILLATION 05/25/2008  . CVA 05/25/2008  . TIA 05/25/2008  . GIANT CELL ARTERITIS 05/25/2008  . FATIGUE 05/25/2008  . Palpitations 05/25/2008  . DYSPNEA 05/25/2008     Coumadin held;  Will likely need rehab or SNF 2 Days Post-Op    LOS: 5 days   Matt B. Hassell Done, MD, [facility] [phone] beeper [phone]  05/30/2019 9:58 AM

## 2019-05-30 NOTE — Plan of Care (Signed)

## 2019-05-30 NOTE — TOC Progression Note (Signed)
Transition of Care [facility]) - Progression Note    Patient Details  Name: [name] [name] MRN: [id number] Date of Birth: [birth year]  [facility]) CM/SW Contact  Joanne Chars, LCSW Phone Number: 05/30/2019, 3:21 PM  Clinical Narrative:   CSW received call from pt son, Sarit Thakur.  He asked about any additional bed offers and then said that they would like to move forward with [facility].    Expected Discharge Plan: [place] Barriers to Discharge: Continued Medical Work up  Expected Discharge Plan and Services Expected Discharge Plan: [place]   Discharge Planning Services: CM Consult Post Acute Care Choice: [place] Living arrangements for the past 2 months: Single Family Home                                       Social Determinants of Health (SDOH) Interventions    Readmission Risk Interventions No flowsheet data found.

## 2019-05-31 DIAGNOSIS — I4819 Other persistent atrial fibrillation: Secondary | ICD-10-CM

## 2019-05-31 LAB — SARS CORONAVIRUS 2 (TAT 6-24 HRS): SARS Coronavirus 2: NEGATIVE

## 2019-05-31 LAB — GLUCOSE, CAPILLARY
Glucose-Capillary: 123 mg/dL — ABNORMAL HIGH (ref 70–99)
Glucose-Capillary: 162 mg/dL — ABNORMAL HIGH (ref 70–99)
Glucose-Capillary: 194 mg/dL — ABNORMAL HIGH (ref 70–99)
Glucose-Capillary: 148 mg/dL — ABNORMAL HIGH (ref 70–99)

## 2019-05-31 MED ORDER — AZELASTINE HCL 0.1 % NA SOLN
2.0000 | Freq: Two times a day (BID) | NASAL | Status: DC | PRN
  Administered 2019-05-31: 2 via NASAL
  Filled 2019-05-31: qty 30

## 2019-05-31 MED ORDER — BISACODYL 10 MG RE SUPP
10.0000 mg | Freq: Once | RECTAL | Status: AC
  Administered 2019-05-31: 10 mg via RECTAL
  Filled 2019-05-31: qty 1

## 2019-05-31 NOTE — Progress Notes (Signed)
Progress Note  Patient Name: [name] [name] Date of Encounter: 05/31/2019  Primary Cardiologist: Jenkins Rouge, MD   Subjective   No chest pain or dyspnea.   Inpatient Medications    Scheduled Meds: . acetaminophen  1,000 mg Oral Q6H  . amiodarone  200 mg Oral Daily  . atorvastatin  10 mg Oral QODAY  . budesonide  6 mg Oral Daily  . Chlorhexidine Gluconate Cloth  6 each Topical Daily  . docusate sodium  100 mg Oral BID  . DULoxetine  30 mg Oral Daily  . feeding supplement  1 Container Oral TID BM  . insulin aspart  0-15 Units Subcutaneous TID WC  . insulin glargine  7 Units Subcutaneous Daily  . lidocaine  10 mL Infiltration Once  . lisinopril  5 mg Oral Daily  . magnesium citrate  1 Bottle Oral Once  . methocarbamol  1,000 mg Oral Q8H  . mirtazapine  7.5 mg Oral QHS  . multivitamin with minerals  1 tablet Oral Daily  . polyethylene glycol  17 g Oral Daily  . sodium chloride  2 g Oral TID WC  . Vitamin D (Ergocalciferol)  50,000 Units Oral Weekly   Continuous Infusions: . sodium chloride 75 mL/hr at 05/31/19 0547  . sodium chloride Stopped (05/28/19 1114)   PRN Meds: sodium chloride, bisacodyl, morphine injection, ondansetron **OR** ondansetron (ZOFRAN) IV, traMADol   Vital Signs    Vitals:   05/30/19 1027 05/30/19 1358 05/30/19 2106 05/31/19 0554  BP: (!) 127/93 111/77 96/79 (!) 143/104  Pulse: 87 88 89 (!) 57  Resp:  16 15 17   Temp:  97.9 F (36.6 C) 99 F (37.2 C) 99 F (37.2 C)  TempSrc:  Oral Oral   SpO2:  96% 98% 99%  Weight:      Height:        Intake/Output Summary (Last 24 hours) at 05/31/2019 1417 Last data filed at 05/31/2019 1109 Gross per 24 hour  Intake 2561.6 ml  Output 200 ml  Net 2361.6 ml   Filed Weights   05/24/19 2306 05/28/19 0815 05/28/19 1758  Weight: 40.8 kg 44 kg 41.3 kg    Physical Exam   General: Thin, frail, elderly female in NAD.  HEENT: OP clear, mucus membranes moist  SKIN: warm, dry. No rashes. Neuro: No  focal deficits  Musculoskeletal: Muscle strength 5/5 all ext  Psychiatric: Mood and affect normal  Neck: No JVD, no carotid bruits, no thyromegaly, no lymphadenopathy.  Lungs:Clear bilaterally, no wheezes, rhonci, crackles Cardiovascular:  Irreg irreg.  Abdomen:Soft. Bowel sounds present. Non-tender.  Extremities: No lower extremity edema.   Labs    Chemistry Recent Labs  Lab 05/24/19 2256 05/26/19 0236 05/28/19 0104 05/28/19 1240 05/29/19 0317  NA 136   < > 125* 132* 136  K 4.9   < > 5.0 4.4 3.8  CL 104   < > 98 100 107  CO2 19*   < > 17* 19* 19*  GLUCOSE 145*   < > 154* 135* 112*  BUN 30*   < > 21 14 13   CREATININE 0.83   < > 0.73 0.61 0.74  CALCIUM 9.5   < > 8.3* 8.6* 8.0*  PROT 7.3  --   --   --   --   ALBUMIN 4.1  --   --   --   --   AST 55*  --   --   --   --   ALT 43  --   --   --   --  ALKPHOS 151*  --   --   --   --   BILITOT 0.9  --   --   --   --   GFRNONAA >60   < > >60 >60 >60  GFRAA >60   < > >60 >60 >60  ANIONGAP 13   < > 10 13 10    < > = values in this interval not displayed.    Hematology Recent Labs  Lab 05/26/19 0236 05/27/19 1510 05/28/19 0104  WBC 10.0 9.3 9.7  RBC 4.57 4.44 4.18  HGB 12.5 12.2 11.3*  HCT 39.2 39.0 35.6*  MCV 85.8 87.8 85.2  MCH 27.4 27.5 27.0  MCHC 31.9 31.3 31.7  RDW 14.6 14.6 14.4  PLT 219 239 212    Cardiac EnzymesNo results for input(s): TROPONINI in the last 168 hours. No results for input(s): TROPIPOC in the last 168 hours.   BNPNo results for input(s): BNP, PROBNP in the last 168 hours.   DDimer No results for input(s): DDIMER in the last 168 hours.   Radiology    No results found. Telemetry    Atrial fib- Personally Reviewed  ECG    No EKG today- Personally Reviewed  Cardiac Studies   Echo 05/26/19:   IMPRESSIONS    1. Left ventricular ejection fraction, by estimation, is 55 to 60%. The  left ventricle has normal function. The left ventricle has no regional  wall motion abnormalities.  There is moderate left ventricular hypertrophy.  Left ventricular diastolic  parameters are indeterminate.  2. Right ventricular systolic function is mildly reduced. The right  ventricular size is normal. There is mildly elevated pulmonary artery  systolic pressure. The estimated right ventricular systolic pressure is  [id number] mmHg.  3. Left atrial size was severely dilated.  4. The mitral valve is abnormal. Severe leaflet thickening. Severe mitral  annular calfications. Moderate mitral valve regurgitation.  5. Tricuspid valve regurgitation is moderate.  6. There is a 23 mm Edwards Sapien prosthetic, stented (TAVR) valve  present in the aortic position. Aortic valve regurgitation is mild. Echo  findings are consistent with normal structure and function of the aortic  valve prosthesis. Mean gradient 2mmHg.  7. Aortic dilatation noted. There is dilatation of the ascending aorta  measuring 40 mm.  8. The inferior vena cava is normal in size with <50% respiratory  variability, suggesting right atrial pressure of 8 mmHg.   Patient Profile     86 y.o. female with a hx of persistent Afib, failed Tikosyn,DM2,HTN, subclavian stenosis on the right, HLD, orthostatic hypotension, unsteady gait,CVA, PVD, AS s/p [id number] at [facility], cardiomyopathy EF 45%,mechanical fall with radial fracture and sternal fracture who was seen for the evaluation of pre-op evaluationat the request of Trauma.  Assessment & Plan    1. Persistent Afib: Rate controlled. She is s/p multiple failed DCCV,Tikosyn and flecainide. Last seen in the Afib clinic 05/2017. [facility] am[facility]arone 200 mg daily.-CHADSVASC7(agex2, female, stroke x 2, HTN, DM2) and has been on coumadin prior to admission.  - Given her fall risk, she will not be restarted on coumadin at this time. I agree with this. - Continue amiodarone.    2. Aortic Stenosis: s/p TAVR at [facility]. Echo this admission with normal  EF, stable TAVR. Mean gradient of 68mmHg. She does not need coumadin post TAVR.   Cardiology will sign off. Please call with questions.   Lauree Chandler. 05/31/2019 2:23 PM  For questions or updates, please contact  Please consult [email or web address] for contact info under Cardiology/STEMI.

## 2019-05-31 NOTE — TOC Progression Note (Signed)
Transition of Care [facility]) - Progression Note    Patient Details  Name: [name] [name] MRN: [id number] Date of Birth: [birth year]  Transition of Care [facility]) CM/SW [place], [place] Phone Number: 05/31/2019, 2:35 PM  Clinical Narrative:    [name] has confirmed bed offer & availability. [name] is starting insurance authorization.   Patient will need insurance authorization and covid test  results before discharging to SNF  [name], MSW, [place] Social Worker   Expected Discharge Plan: [place] Barriers to Discharge: Continued Medical Work up  Expected Discharge Plan and Services Expected Discharge Plan: [place]   Discharge Planning Services: CM Consult Post Acute Care Choice: [place] Living arrangements for the past 2 months: Single Family Home                                       Social Determinants of Health (SDOH) Interventions    Readmission Risk Interventions No flowsheet data found.

## 2019-05-31 NOTE — Progress Notes (Addendum)
Patient ID: [name] [name], female   DOB: 1929/05/17, 86 y.o.   MRN: [id number]    3 Days Post-Op  Subjective: Patient with no complaints.  Eating better she states.  Hasn't moved her bowels yet.  Pain is well controlled for the most part.  ROS: See above, otherwise other systems negative  Objective: Vital signs in last 24 hours: Temp:  [97.9 F (36.6 C)-99 F (37.2 C)] 99 F (37.2 C) (03/22 0554) Pulse Rate:  [57-89] 57 (03/22 0554) Resp:  [15-17] 17 (03/22 0554) BP: (96-143)/(77-104) 143/104 (03/22 0554) SpO2:  [96 %-99 %] 99 % (03/22 0554) Last BM Date: 05/27/19  Intake/Output from previous day: 03/21 0701 - 03/22 0700 In: 2431.6 [P.O.:240; I.V.:2191.6] Out: 800 [Urine:800] Intake/Output this shift: No intake/output data recorded.  PE: Gen: NAD laying in bed HENT: PERRL Heart: regular, + murmur Lungs: CTAB Abd: soft, NT, ND, +BS Ext: Left wrist in splint, moves fingers, warm Neuro: cranial nervies II-XII grossly intact, normal sensation throughout Skin: warm and dry Psych: A&Ox3 this am  Lab Results:  No results for input(s): WBC, HGB, HCT, PLT in the last 72 hours. BMET Recent Labs    05/28/19 1240 05/29/19 0317  NA 132* 136  K 4.4 3.8  CL 100 107  CO2 19* 19*  GLUCOSE 135* 112*  BUN 14 13  CREATININE 0.61 0.74  CALCIUM 8.6* 8.0*   PT/INR No results for input(s): LABPROT, INR in the last 72 hours. CMP     Component Value Date/Time   NA 136 05/29/2019 0317   K 3.8 05/29/2019 0317   CL 107 05/29/2019 0317   CO2 19 (L) 05/29/2019 0317   GLUCOSE 112 (H) 05/29/2019 0317   BUN 13 05/29/2019 0317   CREATININE 0.74 05/29/2019 0317   CALCIUM 8.0 (L) 05/29/2019 0317   PROT 7.3 05/24/2019 2256   ALBUMIN 4.1 05/24/2019 2256   AST 55 (H) 05/24/2019 2256   ALT 43 05/24/2019 2256   ALKPHOS 151 (H) 05/24/2019 2256   BILITOT 0.9 05/24/2019 2256   GFRNONAA >60 05/29/2019 0317   GFRAA >60 05/29/2019 0317   Lipase  No results found for:  LIPASE     Studies/Results: No results found.  Anti-infectives: Anti-infectives (From admission, onward)   Start     Dose/Rate Route Frequency Ordered Stop   05/28/19 0900  cefTRIAXone (ROCEPHIN) 1 g in sodium chloride 0.9 % 100 mL IVPB     1 g 200 mL/hr over 30 Minutes Intravenous Every 24 hours 05/28/19 0802 05/30/19 0923       Assessment/Plan 52F s/p mechanical GLF Sternal fracture- continuecardiac monitoring T7/L4/L5 compression fractures- NSGY c/s (Dr. Ronnald Ramp), lumbar corset as management L distal radius fracture- ORIF of left distal radius by Dr. Lenon Curt 3/20 A fib- currently NSR, had junctional rhythm yesterday. Evaluated by cards and appreciate their assistance.  Left a message for her primary cardiologist, Dr. Dimas Alexandria regarding not restarting her coumadin H/O TAVR- doesn't need coumadin for this type of valve per cardiology.  DM- stable ESBL UTI - Rocephin completed Hyponatremia - resolved, still on salt tabs FEN-carb mod diet DVT- SCDs,coumadin held Dispo - awaiting SNF placement, patient medically stable when bed available, possibly today  ADDENDUM: Spoke with Dr. Dwyane Dee and he agrees that even though her CHADVASC score is high, that given the number of falls she has had, that she should come off of her coumadin.  He sees her next month to follow up.  We will NOT resume her coumadin then  at this time.  LOS: 6 days    Henreitta Cea , [facility] Surgery 05/31/2019, 8:45 AM Please see Amion for pager number during day hours 7:00am-4:30pm or 7:00am -11:30am on weekends

## 2019-05-31 NOTE — TOC Progression Note (Addendum)
Transition of Care [facility]) - Progression Note    Patient Details  Name: [name] [name] MRN: [id number] Date of Birth: [birth year]  Transition of [facility]) CM/SW [place], [place] Phone Number: 05/31/2019, 1:24 PM  Clinical Narrative:     CSW contacted [name] to confirm bed offer & availability. CSW waiting on response.   Patient will need insurance authorization   [name], MSW, [place] Clinical Social Worker    Expected Discharge Plan: Skilled Nursing Facility Barriers to Discharge: Continued Medical Work up  Expected Discharge Plan and Services Expected Discharge Plan: [place]   Discharge Planning Services: CM Consult Post Acute Care Choice: [place] Living arrangements for the past 2 months: Single Family Home                                       Social Determinants of Health (SDOH) Interventions    Readmission Risk Interventions No flowsheet data found.

## 2019-05-31 NOTE — Anesthesia Postprocedure Evaluation (Signed)
Anesthesia Post Note  Patient: [name] [name]  Procedure(s) Performed: OPEN REDUCTION INTERNAL FIXATION (ORIF) DISTAL RADIAL FRACTURE (Left Wrist)     Patient location during evaluation: PACU Anesthesia Type: MAC and Regional Level of consciousness: awake and alert Pain management: pain level controlled Vital Signs Assessment: post-procedure vital signs reviewed and stable Respiratory status: spontaneous breathing, nonlabored ventilation, respiratory function stable and patient connected to nasal cannula oxygen Cardiovascular status: stable and blood pressure returned to baseline Postop Assessment: no apparent nausea or vomiting Anesthetic complications: no    Last Vitals:  Vitals:   05/30/19 2106 05/31/19 0554  BP: 96/79 (!) 143/104  Pulse: 89 (!) 57  Resp: 15 17  Temp: 37.2 C 37.2 C  SpO2: 98% 99%    Last Pain:  Vitals:   05/30/19 2106  TempSrc: Oral  PainSc:                  [place]

## 2019-06-01 ENCOUNTER — Encounter: Payer: Self-pay | Admitting: *Deleted

## 2019-06-01 LAB — GLUCOSE, CAPILLARY
Glucose-Capillary: 120 mg/dL — ABNORMAL HIGH (ref 70–99)
Glucose-Capillary: 185 mg/dL — ABNORMAL HIGH (ref 70–99)
Glucose-Capillary: 227 mg/dL — ABNORMAL HIGH (ref 70–99)
Glucose-Capillary: 107 mg/dL — ABNORMAL HIGH (ref 70–99)

## 2019-06-01 MED ORDER — SODIUM CHLORIDE 1 G PO TABS: 2.0000 g | ORAL_TABLET | Freq: Three times a day (TID) | ORAL | Status: DC

## 2019-06-01 MED ORDER — TRAMADOL HCL 50 MG PO TABS: 50.0000 mg | ORAL_TABLET | Freq: Four times a day (QID) | ORAL | 0 refills | Status: AC | PRN

## 2019-06-01 MED ORDER — METHOCARBAMOL 500 MG PO TABS: 1000.0000 mg | ORAL_TABLET | Freq: Three times a day (TID) | ORAL | Status: AC | PRN

## 2019-06-01 NOTE — TOC Progression Note (Addendum)
Transition of Care [facility]) - Progression Note    Patient Details  Name: MAISAH VACCARO MRN: [id number] Date of Birth: 1929/05/13  Transition of Care [facility]) CM/SW [place], [place] Phone Number: 06/01/2019, 10:53 AM  Clinical Narrative:    3:36pm- CSW continues to await insurance Josem Kaufmann, spoke with Valda Favia at [phone] and they are aware. Will send new PT note when available. PA Claiborne Billings also aware.   10:53am- CSW spoke with PA Claiborne Billings, explained we are still waiting on insurance authorization. [place] accepted in hub and clinicals sent thru, CSW also reached out to SNF admissions liaison Freda Munro.    Expected Discharge Plan: [place] Barriers to Discharge: Continued Medical Work up  Expected Discharge Plan and Services Expected Discharge Plan: [place]   Discharge Planning Services: CM Consult Post Acute Care Choice: [place] arrangements for the past 2 months: Single Family Home Expected Discharge Date: 06/01/19      Readmission Risk Interventions No flowsheet data found.

## 2019-06-01 NOTE — Progress Notes (Signed)
    4 Days Post-Op  Subjective: No new complaints today.  Eating better.  ROS: See above, otherwise other systems negative  Objective: Vital signs in last 24 hours: Temp:  [97.3 F (36.3 C)-98.2 F (36.8 C)] 97.9 F (36.6 C) (03/23 1434) Pulse Rate:  [80-104] 93 (03/23 1434) Resp:  [18-20] 18 (03/23 1434) BP: (93-152)/(62-93) 152/92 (03/23 1434) SpO2:  [96 %-98 %] 96 % (03/23 1434) Last BM Date: 06/01/19  Intake/Output from previous day: 03/22 0701 - 03/23 0700 In: 36 [P.O.:610] Out: 200 [Urine:200] Intake/Output this shift: Total I/O In: 240 [P.O.:240] Out: -   PE: Gen: NAD laying in bed HENT: PERRL Heart: regular,+murmur Lungs: CTAB Abd: soft, NT, ND, +BS Ext: Left wrist in splint, moves fingers, warm Neuro: cranial nervies II-XII grossly intact, normal sensation throughout Skin: warm and dry Psych: A&Ox3 this am  Lab Results:  No results for input(s): WBC, HGB, HCT, PLT in the last 72 hours. BMET No results for input(s): NA, K, CL, CO2, GLUCOSE, BUN, CREATININE, CALCIUM in the last 72 hours. PT/INR No results for input(s): LABPROT, INR in the last 72 hours. CMP     Component Value Date/Time   NA 136 05/29/2019 0317   K 3.8 05/29/2019 0317   CL 107 05/29/2019 0317   CO2 19 (L) 05/29/2019 0317   GLUCOSE 112 (H) 05/29/2019 0317   BUN 13 05/29/2019 0317   CREATININE 0.74 05/29/2019 0317   CALCIUM 8.0 (L) 05/29/2019 0317   PROT 7.3 05/24/2019 2256   ALBUMIN 4.1 05/24/2019 2256   AST 55 (H) 05/24/2019 2256   ALT 43 05/24/2019 2256   ALKPHOS 151 (H) 05/24/2019 2256   BILITOT 0.9 05/24/2019 2256   GFRNONAA >60 05/29/2019 0317   GFRAA >60 05/29/2019 0317   Lipase  No results found for: LIPASE     Studies/Results: No results found.  Anti-infectives: Anti-infectives (From admission, onward)   Start     Dose/Rate Route Frequency Ordered Stop   05/28/19 0900  cefTRIAXone (ROCEPHIN) 1 g in sodium chloride 0.9 % 100 mL IVPB     1 g 200 mL/hr over  30 Minutes Intravenous Every 24 hours 05/28/19 0802 05/30/19 0923       Assessment/Plan 40F s/p mechanical GLF Sternal fracture- continuecardiac monitoring T7/L4/L5 compression fractures- NSGY c/s (Dr. Ronnald Ramp), lumbar corset as management L distal radius fracture-ORIF of left distal radius by Dr. Lenon Curt 3/20 A fib- currently NSR, had junctional rhythm yesterday. Evaluated by cardsand appreciate their assistance.  no further coumadin H/O TAVR- doesn't need coumadin for this type of valve per cardiology.  DM- stable ESBL UTI- Rocephin completed Hyponatremia- resolved, still on salt tabs FEN-carb mod diet DVT- SCDs,coumadin held Dispo - awaiting SNF placement, patient medically stable when bed available, possibly today.   LOS: 7 days    Henreitta Cea , [facility] Surgery 06/01/2019, 3:57 PM Please see Amion for pager number during day hours 7:00am-4:30pm or 7:00am -11:30am on weekends

## 2019-06-01 NOTE — Progress Notes (Signed)
Physical Therapy Treatment Patient Details Name: [name] [name] MRN: [id number] DOB: 04/27/1929 Today's Date: 06/01/2019    History of Present Illness Pt is 86 yo female admitted s/p fall.  Pt with T7/L4/L5 compression fx with no retropulsion managed with corset LSO when OOB, L distal radius fx with splint and NWB (pt now s/p ORIF 3/19), and sternal fracture.  Pt with PMH including arthritis, afib, back pain, CA, CAD, CVA depression, HTN, HCL, cardiac valve replacement 6/18.  Pt with 4-5 falls in last 6 months per H and P.    PT Comments    Patient progressing with ambulation distance, stability and tolerance.  Able to utilize platform RW and manage with elbow weight bearing only on L UE.  Feel she remains high risk for falls and will need SNF level rehab upon d/c.  PT to follow acutely.    Follow Up Recommendations  SNF     Equipment Recommendations  Other (comment)(TBA)    Recommendations for Other Services       Precautions / Restrictions Precautions Precautions: Fall;Back Required Braces or Orthoses: Splint/Cast;Spinal Brace Spinal Brace: Lumbar corset;Applied in sitting position Splint/Cast: LUE casted below elbow to MCPs Restrictions Weight Bearing Restrictions: Yes LUE Weight Bearing: Weight bear through elbow only Other Position/Activity Restrictions: LUE NWB prior to ORIF, no WB status given following procedure, so NWB still assumed. Pt with poor complicance, needing verbal and tactile reminders with every movement    Mobility  Bed Mobility               General bed mobility comments: up in chair  Transfers Overall transfer level: Needs assistance Equipment used: Left platform walker Transfers: Sit to/from Stand Sit to Stand: Mod assist         General transfer comment: cues and assist to keep L hand in her lap and lifting help from chair  Ambulation/Gait Ambulation/Gait assistance: +2 safety/equipment;Mod assist Gait Distance (Feet): 40 Feet(x  2) Assistive device: Left platform walker Gait Pattern/deviations: Step-through pattern;Trunk flexed     General Gait Details: assist for balance and walker management due to pt feels it doesn't roll well (little heavy on side with platform), walked into hallway son pushing chair behind and sat to rest, then back to room   Stairs             Wheelchair Mobility    Modified Rankin (Stroke Patients Only)       Balance Overall balance assessment: Needs assistance;History of Falls Sitting-balance support: Feet supported Sitting balance-Leahy Scale: Good     Standing balance support: Bilateral upper extremity supported Standing balance-Leahy Scale: Poor Standing balance comment: UE support and physical assist for balance                            Cognition Arousal/Alertness: Awake/alert Behavior During Therapy: WFL for tasks assessed/performed Overall Cognitive Status: Impaired/Different from baseline Area of Impairment: Attention                 Orientation Level: Disoriented to;Situation;Place Current Attention Level: Sustained Memory: Decreased recall of precautions;Decreased short-term memory Following Commands: Follows one step commands consistently;Follows one step commands with increased time Safety/Judgement: Decreased awareness of safety;Decreased awareness of deficits            Exercises      General Comments General comments (skin integrity, edema, etc.): applied back brace total A and pt reported felt best it has felt  Pertinent Vitals/Pain Pain Assessment: Faces Faces Pain Scale: Hurts even more Pain Location: back Pain Descriptors / Indicators: Discomfort;Grimacing;Sore Pain Intervention(s): Monitored during session;Repositioned;Limited activity within patient's tolerance    Home Living                      Prior Function            PT Goals (current goals can now be found in the care plan section)  Progress towards PT goals: Progressing toward goals    Frequency    Min 3X/week      PT Plan Current plan remains appropriate    Co-evaluation              AM-PAC PT "6 Clicks" Mobility   Outcome Measure  Help needed turning from your back to your side while in a flat bed without using bedrails?: A Little Help needed moving from lying on your back to sitting on the side of a flat bed without using bedrails?: A Lot Help needed moving to and from a bed to a chair (including a wheelchair)?: A Lot Help needed standing up from a chair using your arms (e.g., wheelchair or bedside chair)?: A Lot Help needed to walk in hospital room?: A Lot Help needed climbing 3-5 steps with a railing? : Total 6 Click Score: 12    End of Session Equipment Utilized During Treatment: Back brace Activity Tolerance: Patient tolerated treatment well Patient left: in chair;with call bell/phone within reach;with family/visitor present   PT Visit Diagnosis: Unsteadiness on feet (R26.81);History of falling (Z91.81);Muscle weakness (generalized) (M62.81)     Time: [id number] PT Time Calculation (min) (ACUTE ONLY): 25 min  Charges:  $Gait Training: 8-22 mins $Therapeutic Activity: 8-22 mins                     [name] [name], [name] [name] [phone] 06/01/2019    [name] [name] 06/01/2019, 3:43 PM

## 2019-06-01 NOTE — Care Management Important Message (Signed)
Important Message  Patient Details  Name: [name] [name] MRN: [id number] Date of Birth: 01-04-1930   Medicare Important Message Given:  Yes     Memory Argue 06/01/2019, 2:50 PM   Unable To give IM  Due to contact precautions, IM mailed.

## 2019-06-01 NOTE — Discharge Summary (Addendum)
Patient ID: [name] [name] [id number] [birth year] 86 y.o.  Admit date: 05/24/2019 Discharge date: 06/03/2019  Admitting Diagnosis: GLF Sternal fracture - LBBB on EKG-stable, troponin pending, cardiac monitoring T7/L4/L5 compression fractures - NSGY c/s (Dr. Ronnald Ramp) L distal radius fracture  Discharge Diagnosis Patient Active Problem List   Diagnosis Date Noted  . Malnutrition of moderate degree 05/28/2019  . Pressure injury of skin 05/27/2019  . Compressed spine fracture ([place]) 05/25/2019  . Near syncope 06/17/2015  . Pre-syncope 06/17/2015  . Anticoagulant long-term use 08/27/2010  . Chest pain, unspecified 06/29/2010  . MURMUR 05/17/2009  . VASCULITIS 11/23/2008  . HYPERCHOLESTEROLEMIA 05/25/2008  . Essential hypertension 05/25/2008  . ATRIAL FIBRILLATION 05/25/2008  . CVA 05/25/2008  . TIA 05/25/2008  . GIANT CELL ARTERITIS 05/25/2008  . FATIGUE 05/25/2008  . Palpitations 05/25/2008  . DYSPNEA 05/25/2008  Sternal fracture T7/L4/L5 compression fractures L distal radius fracture A fib H/O TAVR DM ESBL UTI Hyponatremia  Consultants Dr. Ronnald Ramp, NSGY Dr. Lenon Curt - ortho hand Dr. Irish Lack - cardiology  Reason for Admission: 49F s/p mechanical GLF around 1700 on 05/24/2019. Most significant complaint is "chest pain", that she localizes to the epigastrium and states it feels the same as pain that began approximately 2 weeks ago. Reports turning around in the kitchen while using her walker and tripping over one of the wheels. Denies LOC. History of 4-5 falls in the last six months. Lives alone, but has a caregiver that comes in the day and one at night. Son stays with her 2-3 nights per week.  Procedures ORIF of L distal radius fx - Dr. Rosita Fire Course:  Sternal fracture No intervention warranted.  Pain control was good throughout the admission.  T7/L4/L5 compression fractures She was evaluated by NSGY. No surgical intervention was warranted.  She was  placed in a lumbar corset for stabilization.  L distal radius fracture She underwent ORIF of this injury by Dr. Lenon Curt on 3/20.  She tolerated this well.  She did require cardiac clearance prior to and this was given with low risk.  A fib The patient remained in NSR but with a junctional rhythm with a LBBB during her stay.  On the last day or so she did convert to A fib but was rate controlled.  She was initially on coumadin upon arrival but given her numerous fall history, this was discussed with cardiology here as well as Dr. Dwyane Dee, her primary cardiologist in [facility], who agreed she is too high risk for anticoagulation.  Therefore, her coumadin was stopped and will not be reinitiated.  H/O TAVR Doesn't need coumadin for this type of valve per cardiology. otherwise stable.  DM Home medications resumed along with SSI prn.  This remained stable.  ESBL UTI The patient was noted to have ESBL UTI.  She was treated with 3 days of Rocephin.  Hyponatremia She was noted to have hyponatremia.  She was placed on salt tabs.  She ultimately had to be given 3% saline for about 1/2 day and this improved.  Her salt tab dose was increased with no further issues.  She will need a BMET checked at the facility to ensure stability.  Physical Exam: Gen: NAD laying in bed HENT: PERRL Heart: irregular,+murmur Lungs: CTAB Abd: soft, NT, ND, +BS Ext: Left wrist in splint, moves fingers, warm Neuro: cranial nervies II-XII grossly intact, normal sensation throughout Skin: warm and dry Psych: A&Ox3 this am  Allergies as of 06/01/2019  Reactions   Ciprofloxacin Other (See Comments)   tachycardia   Escitalopram Nausea Only, Other (See Comments)   "lips puffed", "tight in chest", "nasuea", "dizzy"   Ranitidine Diarrhea      Medication List    STOP taking these medications   warfarin 5 MG tablet Commonly known as: COUMADIN     TAKE these medications   acetaminophen 650 MG CR  tablet Commonly known as: TYLENOL Take 1,300 mg by mouth in the morning and at bedtime.   Align 4 MG Caps Take 4 mg by mouth daily.   amiodarone 200 MG tablet Commonly known as: PACERONE TAKE 1/2 TABLET BY MOUTH DAILY What changed: how much to take   aspirin 81 MG tablet Take 81 mg by mouth daily.   atorvastatin 10 MG tablet Commonly known as: LIPITOR Take 10 mg by mouth See admin instructions. Every other night   azelastine 0.1 % nasal spray Commonly known as: ASTELIN Place 1-2 sprays into both nostrils 2 (two) times daily as needed for allergies.   budesonide 3 MG 24 hr capsule Commonly known as: ENTOCORT EC Take 6 mg by mouth daily.   DULoxetine 30 MG capsule Commonly known as: CYMBALTA Take 30 mg by mouth daily.   Klor-Con M20 20 MEQ tablet Generic drug: potassium chloride SA Take 20 mEq by mouth daily.   lisinopril 5 MG tablet Commonly known as: ZESTRIL Take 5 mg by mouth daily.   methocarbamol 500 MG tablet Commonly known as: ROBAXIN Take 2 tablets (1,000 mg total) by mouth every 8 (eight) hours as needed for muscle spasms.   mirtazapine 15 MG tablet Commonly known as: REMERON Take 7.5 mg by mouth at bedtime.   traMADol 50 MG tablet Commonly known as: ULTRAM Take 1 tablet (50 mg total) by mouth every 6 (six) hours as needed for moderate pain or severe pain (50mg  for moderate pain, 100mg  for severe pain).   Tyler Aas FlexTouch 100 UNIT/ML FlexTouch Pen Generic drug: insulin degludec Inject 7 Units into the skin daily.   Vitamin D (Ergocalciferol) 1.25 MG (50000 UNIT) Caps capsule Commonly known as: DRISDOL Take 50,000 Units by mouth once a week.        Follow-up Information    Eustace Moore, MD. Schedule an appointment as soon as possible for a visit in 2 week(s).   Specialty: Neurosurgery Contact information: [address] [address] [phone]        Dayna Barker, MD Follow up in 2 week(s).   Specialty:  General Surgery Why: Call to schedule an appointment to have your splint looked at after surgery Contact information: [address] [place] [address] [phone]        Raelene Bott, MD Follow up.   Specialty: Internal Medicine Why: follow up as needed for primary medical problems Contact information: [place] [address] [phone]        Dr. Dwyane Dee Follow up in 1 month(s).   Why: at your already scheduled appointment next month          Signed: Saverio Danker, [facility] Surgery 06/01/2019, 8:50 AM Please see Amion for pager number during day hours 7:00am-4:30pm, 7-11:30am on Weekends

## 2019-06-01 NOTE — Discharge Instructions (Signed)
How to Use a Back Brace  A back brace is a form-fitting device that wraps around your trunk to support your lower back, abdomen, and hips. You may need to wear a back brace to relieve back pain or to correct a medical condition related to the back, such as abnormal curvature of the spine (scoliosis). A back brace can maintain or correct the shape of the spine and prevent a spinal problem from getting worse. A back brace can also take pressure off the layers of tissue (disks) between the bones of the spine (vertebrae). You may need a back brace to keep your back and spine in place while you heal from an injury or recover from surgery. Back braces can be either plastic (rigid brace) or soft elastic (dynamic brace).  A rigid brace usually covers both the front and back of the entire upper body.  A soft brace may cover only the lower back and abdomen and may fasten with self-adhesive elastic straps. Your health care provider will recommend the proper brace for your needs and medical condition. What are the risks?  A back brace may not help if you do not wear it as directed by your health care provider. Be sure to wear the brace exactly as instructed in order to prevent further back problems.  Wearing the brace may be uncomfortable at first. You may have trouble sleeping with it on. It may also be hard for you to do certain activities while wearing it.  If the back brace is not worn as instructed, it may cause rubbing and pressure on your skin and may cause sores. How to use a back brace Different types of braces will have different instructions for use. Follow instructions from your health care provider about:  How to put on the brace.  When and how often to wear the brace. In some cases, braces may need to be worn for long stretches of time. For example, a brace may need to be worn for 16-23 hours a day when used for scoliosis.  How to take off the brace.  Any safety tips you should follow when  wearing the brace. This may include: ? Move carefully while wearing the brace. The brace restricts your movement and could lead to additional injuries. ? Use a cane or walker for support if you feel unsteady. ? Sit in high, firm chairs. It may be difficult to stand up from low, soft chairs. ? Check the skin around the brace every day. Tell your health care provider about any concerns. How to care for a back brace  Do not let the back brace get wet. Typically, you will remove the brace for bathing and then put it back on afterward.  If you have a rigid brace, be sure to store it in a safe place when you are not wearing it. This will help to prevent damage.  Clean or wash the back brace with mild soap and water as told by your health care provider. Contact a health care provider if:  Your brace gets damaged.  You have pain or discomfort when wearing the back brace.  Your back pain is getting worse or is not improving over time.  You notice redness or skin breakdown from wearing the brace. Summary  You may need to wear a back brace to relieve back pain or to correct a medical condition related to the back, such as abnormal curvature of the spine (scoliosis).  Follow instructions as told by your health  care provider about how to use the brace and how to take care of it.  A back brace may not help if you do not wear it as directed by your health care provider. Wear the brace exactly as instructed in order to prevent further back problems.  Move carefully while wearing the brace.  Contact a health care provider if you have pain or discomfort when wearing the back brace or your back pain is getting worse or is not improving over time. This information is not intended to replace advice given to you by your health care provider. Make sure you discuss any questions you have with your health care provider. Document Revised: 01/21/2018 Document Reviewed: 01/21/2018 Elsevier Patient Education   2020 [employer].

## 2019-06-02 LAB — GLUCOSE, CAPILLARY
Glucose-Capillary: 131 mg/dL — ABNORMAL HIGH (ref 70–99)
Glucose-Capillary: 184 mg/dL — ABNORMAL HIGH (ref 70–99)
Glucose-Capillary: 182 mg/dL — ABNORMAL HIGH (ref 70–99)
Glucose-Capillary: 180 mg/dL — ABNORMAL HIGH (ref 70–99)

## 2019-06-02 NOTE — Progress Notes (Signed)
Nutrition Follow-up  DOCUMENTATION CODES:   Underweight, Non-severe (moderate) malnutrition in context of chronic illness  INTERVENTION:   -Continue MVI with minerals daily -Continue Boost Breeze po TID, each supplement provides 250 kcal and 9 grams of protein -Continue Magic cup TID with meals, each supplement provides 290 kcal and 9 grams of protein  NUTRITION DIAGNOSIS:   Moderate Malnutrition related to chronic illness(CVA) as evidenced by energy intake < or equal to 75% for > or equal to 1 month, mild fat depletion, moderate fat depletion, mild muscle depletion, moderate muscle depletion.  Ongoing  GOAL:   Patient will meet greater than or equal to 90% of their needs  Progressing   MONITOR:   PO intake, Supplement acceptance, Labs, Weight trends, Skin, I & O's  REASON FOR ASSESSMENT:   Other (Comment)    ASSESSMENT:   49F s/p mechanical GLF around 1700 on 05/24/2019. Most significant complaint is "chest pain", that she localizes to the epigastrium and states it feels the same as pain that began approximately 2 weeks ago. Reports turning around in the kitchen while using her walker and tripping over one of the wheels. Denies LOC. History of 4-5 falls in the last six months. Lives alone, but has a caregiver that comes in the day and one at night. Son stays with her 2-3 nights per week.  3/20- s/p PROCEDURE:  Open reduction internal fixation of the left distal radius with a Bi[employer] volar plate.  Reviewed I/O's: +40 ml x 24 hours and +1.6 L since admission  UOP: 200 ml x 24 hours   Attempted to speak with pt via phone, however, no answer.   Intake has improved since admission; noted PO's 15-65%. Pt is also consuming Boost Breeze supplements per MAR.   Per [facility] team notes, plan to d/c to SNF ([facility]) today pending insurance authorization and bed availability.   Medications reviewed and include colace and miralax  Labs reviewed: CBGS: 107-227 (inpatient orders for  glycemic control are 0-15 units insulin aspart TID with meals and 7 units insulin glargine daily).   Diet Order:   Diet Order            Diet regular Room service appropriate? Yes; Fluid consistency: Thin; Fluid restriction: Other (see comments)  Diet effective now              EDUCATION NEEDS:   Education needs have been addressed  Skin:  Skin Assessment: Skin Integrity Issues: Skin Integrity Issues:: Incisions Stage I: sacrum Incisions: rt arm  Last BM:  06/01/19  Height:   Ht Readings from Last 1 Encounters:  05/28/19 5' (1.524 m)    Weight:   Wt Readings from Last 1 Encounters:  05/28/19 41.3 kg    Ideal Body Weight:  45.5 kg  BMI:  Body mass index is 17.77 kg/m.  Estimated Nutritional Needs:   Kcal:  1200-1400  Protein:  55-70 grams  Fluid:  > 1.2 L    Loistine Chance, RD, LDN, [address] Registered Dietitian II Certified Diabetes Care and Education Specialist Please refer to [facility] for RD and/or RD on-call/weekend/after hours pager

## 2019-06-02 NOTE — Progress Notes (Signed)
Occupational Therapy Treatment Patient Details Name: [name] [name] MRN: [id number] DOB: 06/23/1929 Today's Date: 06/02/2019    History of present illness Pt is 86 yo female admitted s/p fall.  Pt with T7/L4/L5 compression fx with no retropulsion managed with corset LSO when OOB, L distal radius fx with splint and NWB (pt now s/p ORIF 3/19), and sternal fracture.  Pt with PMH including arthritis, afib, back pain, CA, CAD, CVA depression, HTN, HCL, cardiac valve replacement 6/18.  Pt with 4-5 falls in last 6 months per H and P.   OT comments  Patient supine in bed and agreeable to OT session.  Reviewed back precautions and NWB L wrist (ok through elbow), no recall of precautions. Max assist for brace management at EOB.  Patient completing transfers to [place] with mod assist, max assist for toileting and LB dressing.  Educated on compensatory techniques for LB ADLs.  Continue to recommend SNF.  Will follow acutely.    Follow Up Recommendations  SNF;Supervision/Assistance - 24 hour    Equipment Recommendations  Other (comment)(TBD at next venue of care)    Recommendations for Other Services      Precautions / Restrictions Precautions Precautions: Fall;Back Precaution Booklet Issued: No Precaution Comments: verbal discussion of back precautions; pt with no recall  Required Braces or Orthoses: Splint/Cast;Spinal Brace Spinal Brace: Lumbar corset;Applied in sitting position Splint/Cast: LUE casted below elbow to MCPs Restrictions Weight Bearing Restrictions: Yes LUE Weight Bearing: Non weight bearing Other Position/Activity Restrictions: LUE NWB prior to ORIF, no WB status given following procedure, so NWB still assumed. Pt with poor complicance, needing verbal and tactile reminders with every movement       Mobility Bed Mobility Overal bed mobility: Needs Assistance Bed Mobility: Rolling;Sidelying to Sit Rolling: Mod assist Sidelying to sit: Mod assist;HOB  elevated       General bed mobility comments: mod assist to roll to R side, ascend trunk and scoot to EOB; requires cueing for sequencing and  technique for back precautions and NWB L hand   Transfers Overall transfer level: Needs assistance Equipment used: Left platform walker Transfers: Sit to/from Stand;Stand Pivot Transfers Sit to Stand: Mod assist Stand pivot transfers: Min assist       General transfer comment: mod assist to power up and steady with cueing for hand placement and sequencing to adhere to precautions     Balance Overall balance assessment: Needs assistance;History of Falls Sitting-balance support: No upper extremity supported;Feet supported Sitting balance-Leahy Scale: Good Sitting balance - Comments: supervision   Standing balance support: Bilateral upper extremity supported;During functional activity Standing balance-Leahy Scale: Poor Standing balance comment: BUE support (L elbow) and external support to maintain balance                            ADL either performed or assessed with clinical judgement   ADL Overall ADL's : Needs assistance/impaired                 Upper Body Dressing : Maximal assistance;Sitting Upper Body Dressing Details (indicate cue type and reason): to don brace Lower Body Dressing: Maximal assistance;Sit to/from stand Lower Body Dressing Details (indicate cue type and reason): to don brief, unable to complete figure 4 technique  Toilet Transfer: Stand-pivot;RW;Moderate assistance(L platform RW ) Toilet Transfer Details (indicate cue type and reason): mod assist to [facility], cueing for safety  Toileting- Clothing Manipulation and Hygiene: Total assistance;Sit to/from stand Toileting -  Clothing Manipulation Details (indicate cue type and reason): hygiene and clothing mgmt     Functional mobility during ADLs: Minimal assistance;Moderate assistance;Cueing for safety;Cueing for sequencing General ADL Comments:  continues to be limited due to cognition, precautions, safety and balance      Vision   Vision Assessment?: No apparent visual deficits   Perception     Praxis      Cognition Arousal/Alertness: Awake/alert Behavior During Therapy: WFL for tasks assessed/performed Overall Cognitive Status: Impaired/Different from baseline Area of Impairment: Attention;Memory;Following commands;Safety/judgement;Awareness;Problem solving                   Current Attention Level: Sustained Memory: Decreased short-term memory;Decreased recall of precautions Following Commands: Follows one step commands consistently;Follows one step commands with increased time;Follows multi-step commands inconsistently Safety/Judgement: Decreased awareness of safety;Decreased awareness of deficits Awareness: Emergent Problem Solving: Slow processing;Difficulty sequencing;Requires verbal cues General Comments: no recall of back precautions, requires cueing for safety and decreased awareness of deficits         Exercises     Shoulder Instructions       General Comments      Pertinent Vitals/ Pain       Pain Assessment: Faces Faces Pain Scale: Hurts even more Pain Location: back Pain Descriptors / Indicators: Discomfort;Grimacing;Sore Pain Intervention(s): Limited activity within patient's tolerance;Monitored during session;Repositioned  Home Living                                          Prior Functioning/Environment              Frequency  Min 2X/week        Progress Toward Goals  OT Goals(current goals can now be found in the care plan section)  Progress towards OT goals: Progressing toward goals  Acute Rehab OT Goals Patient Stated Goal: to increase independence OT Goal Formulation: With patient  Plan Discharge plan remains appropriate;Frequency remains appropriate    Co-evaluation                 AM-PAC OT "6 Clicks" Daily Activity     Outcome  Measure   Help from another person eating meals?: A Little Help from another person taking care of personal grooming?: A Little Help from another person toileting, which includes using toliet, bedpan, or urinal?: A Lot Help from another person bathing (including washing, rinsing, drying)?: A Lot Help from another person to put on and taking off regular upper body clothing?: A Little Help from another person to put on and taking off regular lower body clothing?: A Lot 6 Click Score: 15    End of Session Equipment Utilized During Treatment: Gait belt;Back brace(L platform RW )  OT Visit Diagnosis: Unsteadiness on feet (R26.81);Muscle weakness (generalized) (M62.81);Pain;Other symptoms and signs involving cognitive function Pain - Right/Left: Left Pain - part of body: (back ,arm)   Activity Tolerance Patient tolerated treatment well   Patient Left in bed;with bed alarm set;with call bell/phone within reach   Nurse Communication Mobility status;Precautions        Time: [id number] OT Time Calculation (min): 24 min  Charges: OT General Charges $OT Visit: 1 Visit OT Treatments $Self Care/Home Management : 23-37 mins  [place] Pager [phone] Office [phone]    Delight Stare 06/02/2019, 10:42 AM

## 2019-06-02 NOTE — Progress Notes (Signed)
Patient ID: [name] [name], female   DOB: Jun 25, 1929, 86 y.o.   MRN: [id number]    5 Days Post-Op  Subjective: Some nausea this morning after taking meds on an empty stomach.  Otherwise doing well with no new complaints.  ROS: See above, otherwise other systems negative  Objective: Vital signs in last 24 hours: Temp:  [97.4 F (36.3 C)-98 F (36.7 C)] 97.4 F (36.3 C) (03/24 0436) Pulse Rate:  [86-93] 86 (03/24 0436) Resp:  [17-18] 18 (03/24 0436) BP: (129-152)/(53-92) 129/53 (03/24 0436) SpO2:  [94 %-96 %] 94 % (03/24 0436) Last BM Date: 06/01/19  Intake/Output from previous day: 03/23 0701 - 03/24 0700 In: 240 [P.O.:240] Out: 200 [Urine:200] Intake/Output this shift: No intake/output data recorded.  PE: Gen: NAD laying in bed HENT: PERRL Heart: regular,+murmur Lungs: CTAB Abd: soft, NT, ND, +BS Ext: Left wrist in splint, moves fingers, warm Neuro: cranial nervies II-XII grossly intact, normal sensation throughout Skin: warm and dry Psych: A&Ox3 this am  Lab Results:  No results for input(s): WBC, HGB, HCT, PLT in the last 72 hours. BMET No results for input(s): NA, K, CL, CO2, GLUCOSE, BUN, CREATININE, CALCIUM in the last 72 hours. PT/INR No results for input(s): LABPROT, INR in the last 72 hours. CMP     Component Value Date/Time   NA 136 05/29/2019 0317   K 3.8 05/29/2019 0317   CL 107 05/29/2019 0317   CO2 19 (L) 05/29/2019 0317   GLUCOSE 112 (H) 05/29/2019 0317   BUN 13 05/29/2019 0317   CREATININE 0.74 05/29/2019 0317   CALCIUM 8.0 (L) 05/29/2019 0317   PROT 7.3 05/24/2019 2256   ALBUMIN 4.1 05/24/2019 2256   AST 55 (H) 05/24/2019 2256   ALT 43 05/24/2019 2256   ALKPHOS 151 (H) 05/24/2019 2256   BILITOT 0.9 05/24/2019 2256   GFRNONAA >60 05/29/2019 0317   GFRAA >60 05/29/2019 0317   Lipase  No results found for: LIPASE     Studies/Results: No results found.  Anti-infectives: Anti-infectives (From admission, onward)   Start      Dose/Rate Route Frequency Ordered Stop   05/28/19 0900  cefTRIAXone (ROCEPHIN) 1 g in sodium chloride 0.9 % 100 mL IVPB     1 g 200 mL/hr over 30 Minutes Intravenous Every 24 hours 05/28/19 0802 05/30/19 0923       Assessment/Plan 65F s/p mechanical GLF Sternal fracture- continuecardiac monitoring T7/L4/L5 compression fractures- NSGY c/s (Dr. Ronnald Ramp), lumbar corset as management L distal radius fracture-ORIF of left distal radius by Dr. Lenon Curt 3/20 A fib- currently NSR, had junctional rhythm yesterday. Evaluated by cardsand appreciate their assistance.no further coumadin H/O TAVR- doesn't need coumadin for this type of valve per cardiology.  DM-stable ESBL UTI-Rocephin completed Hyponatremia-resolved, still on salt tabs FEN-carb mod diet DVT- SCDs,coumadin held Dispo -awaiting SNF placement, patient medically stable when bed available, possibly today.   LOS: 8 days    Henreitta Cea , [facility] 06/02/2019, 8:25 AM Please see Amion for pager number during day hours 7:00am-4:30pm or 7:00am -11:30am on weekends

## 2019-06-02 NOTE — Plan of Care (Signed)
  Problem: Education: Goal: Knowledge of General Education information will improve Description: Including pain rating scale, medication(s)/side effects and non-pharmacologic comfort measures Outcome: Progressing   Problem: Health Behavior/Discharge Planning: Goal: Ability to manage health-related needs will improve Outcome: Progressing   Problem: Clinical Measurements: Goal: Ability to maintain clinical measurements within normal limits will improve Outcome: Progressing Goal: Will remain free from infection Outcome: Progressing Goal: Cardiovascular complication will be avoided Outcome: Progressing   Problem: Activity: Goal: Risk for activity intolerance will decrease Outcome: Progressing   Problem: Elimination: Goal: Will not experience complications related to bowel motility Outcome: Progressing Goal: Will not experience complications related to urinary retention Outcome: Progressing   Problem: Pain Managment: Goal: General experience of comfort will improve Outcome: Progressing   Problem: Safety: Goal: Ability to remain free from injury will improve Outcome: Progressing   Problem: Skin Integrity: Goal: Risk for impaired skin integrity will decrease Outcome: Progressing

## 2019-06-02 NOTE — TOC Progression Note (Addendum)
Transition of Care [facility]) - Progression Note    Patient Details  Name: [name] [name] MRN: [id number] Date of Birth: 1930/02/19  Transition of Care [facility]) CM/SW [place],  Phone Number: 06/02/2019, 11:16 AM  Clinical Narrative:  3:39pm- CSW has spoken with Freda Munro at [place] with [facility]. CSW had explained to family (pt son Ed) that we could not switch SNF at this time however he had spoken with liaison at [facility] and Josem Kaufmann had been submitted also for [facility]. Liaisons to work out withdrawing one [place]. Plan now is for [facility] pending approval with [employer]. At this time no determination has been made from [employer].    1:15pm- CSW called and spoke with Freda Munro in admissions at [facility]. We discussed pending status and while CSW was on the phone she called [employer]. Per [place] [employer] pt Josem Kaufmann still under review. CSW called and spoke with pt son Ed, pt son inquiring about changing SNF placement, CSW explained that pt is stable and has been discharged pending insurance approval, that unfortunately at this time we are unable to keep pt at hospital longer for new facility and new authorization.   11:16am- CSW has sent additional OT and PT note through to [facility], messages left with Freda Munro in admissions. Continue to await insurance determination.   Expected Discharge Plan: [place] Barriers to Discharge: Continued Medical Work up  Expected Discharge Plan and Services Expected Discharge Plan: [place]   Discharge Planning Services: CM Consult Post Acute Care Choice: [place] arrangements for the past 2 months: Single Family Home Expected Discharge Date: 06/01/19                Readmission Risk Interventions No flowsheet data found.

## 2019-06-03 LAB — GLUCOSE, CAPILLARY
Glucose-Capillary: 121 mg/dL — ABNORMAL HIGH (ref 70–99)
Glucose-Capillary: 260 mg/dL — ABNORMAL HIGH (ref 70–99)

## 2019-06-03 LAB — SARS CORONAVIRUS 2 (TAT 6-24 HRS): SARS Coronavirus 2: NEGATIVE

## 2019-06-03 NOTE — TOC Transition Note (Signed)
Transition of Care [facility]) - CM/SW Discharge Note   Patient Details  Name: [name] [name] MRN: [id number] Date of Birth: 1929/08/14  [facility]) CM/SW Contact:  Alexander Mt, LCSW Phone Number: 06/03/2019, 2:08 PM   Clinical Narrative:    CSW spoke with Elza Rafter received.  Pt [facility] papers and script on chart. Family has completed paperwork. Plan for d/c to [place] as requested by family.   Final next level of care: Skilled Nursing Facility Barriers to Discharge: Barriers Resolved   Patient Goals and CMS Choice   CMS Medicare.gov Compare Post Acute Care list provided to:: Patient Represenative (must comment)(adult children) Choice offered to / list presented to : Adult Children  Discharge Placement Existing PASRR number confirmed : 05/28/19          Patient chooses bed at: Other - please specify in the comment section below:([facility]) Patient to be transferred to facility by: [place] Name of family member notified: pt son at bedside updated by RN Kristi Patient and family notified of of transfer: 06/03/19   Readmission Risk Interventions Readmission Risk Prevention Plan 06/02/2019  Transportation Screening Complete  PCP or Specialist Appt within 5-7 Days Not Complete  Not Complete comments plan for SNF  Home Care Screening Complete  Medication Review (RN CM) Referral to Pharmacy  Some recent data might be hidden

## 2019-06-03 NOTE — Plan of Care (Signed)
  Problem: Education: Goal: Knowledge of General Education information will improve Description Including pain rating scale, medication(s)/side effects and non-pharmacologic comfort measures Outcome: Progressing   Problem: Health Behavior/Discharge Planning: Goal: Ability to manage health-related needs will improve Outcome: Progressing   Problem: Clinical Measurements: Goal: Ability to maintain clinical measurements within normal limits will improve Outcome: Progressing Goal: Diagnostic test results will improve Outcome: Progressing   Problem: Activity: Goal: Risk for activity intolerance will decrease Outcome: Progressing   Problem: Nutrition: Goal: Adequate nutrition will be maintained Outcome: Progressing   Problem: Pain Managment: Goal: General experience of comfort will improve Outcome: Progressing   Problem: Safety: Goal: Ability to remain free from injury will improve Outcome: Progressing   Problem: Skin Integrity: Goal: Risk for impaired skin integrity will decrease Outcome: Progressing   

## 2019-06-03 NOTE — Progress Notes (Signed)
AVS; signed, printed prescription; and social worker's paperwork placed in discharge packet. Report called and given to Lattie Haw, RN at [place]. All questions answered to satisfaction. [facility] to transport pt with all belongings.

## 2019-06-03 NOTE — Social Work (Signed)
Clinical Social Worker facilitated patient discharge including contacting patient family and facility to confirm patient discharge plans.  Clinical information faxed to facility and family agreeable with plan.  CSW arranged ambulance transport via [facility] to [place] RN to call [phone] with report prior to discharge.  Clinical Social Worker will sign off for now as social work intervention is no longer needed. Please consult [place] again if new need arises.  Westley Hummer, MSW, LCSW Clinical Social Worker

## 2019-06-03 NOTE — Progress Notes (Signed)
Physical Therapy Treatment Patient Details Name: [name] [name] MRN: [id number] DOB: 03/23/1929 Today's Date: 06/03/2019    History of Present Illness Pt is an 86 yo female admitted s/p fall.  Pt with T7/L4/L5 compression fx with no retropulsion managed with corset LSO when OOB, L distal radius fx with splint and NWB (pt now s/p ORIF 3/19), and sternal fracture.  Pt with PMH including arthritis, afib, back pain, CA, CAD, CVA depression, HTN, HCL, cardiac valve replacement 6/18.  Pt with 4-5 falls in last 6 months per H and P.    PT Comments    Pt making slow, steady progress with functional mobility and tolerated gait training into hallway with min A and L PFRW. Pt's son present throughout, encouraging pt and eager to learn how to safely don/doff LSO. Pt would continue to benefit from skilled physical therapy services at this time while admitted and after d/c to address the below listed limitations in order to improve overall safety and independence with functional mobility.  Of note, pt's HR increasing to as high as 135 bpm with activity, quickly decreasing to low 110's with rest.     Follow Up Recommendations  SNF     Equipment Recommendations  Other (comment)(defer to next venue of care)    Recommendations for Other Services       Precautions / Restrictions Precautions Precautions: Fall;Back Required Braces or Orthoses: Splint/Cast;Spinal Brace Spinal Brace: Lumbar corset;Applied in sitting position Restrictions Weight Bearing Restrictions: Yes LUE Weight Bearing: Weight bear through elbow only    Mobility  Bed Mobility               General bed mobility comments: pt OOB seated on BSC upon arrival  Transfers Overall transfer level: Needs assistance Equipment used: Left platform walker Transfers: Sit to/from Stand Sit to Stand: Mod assist;Min guard         General transfer comment: from [facility] pt requiring mod A to rise into standing, progressing to min guard from  recliner chair x2; cueing needed for sequencing and safe hand placement  Ambulation/Gait Ambulation/Gait assistance: Min assist Gait Distance (Feet): 50 Feet Assistive device: Left platform walker Gait Pattern/deviations: Step-through pattern;Trunk flexed Gait velocity: decreased   General Gait Details: frequent cueing to maintain proximity to RW and almost constant assistance needed for RW management; pt with consistent trunk flexed posture with minimal ability to correct with cueing   Stairs             Wheelchair Mobility    Modified Rankin (Stroke Patients Only)       Balance Overall balance assessment: Needs assistance;History of Falls Sitting-balance support: No upper extremity supported;Feet supported Sitting balance-Leahy Scale: Good     Standing balance support: Bilateral upper extremity supported;During functional activity Standing balance-Leahy Scale: Poor                              Cognition Arousal/Alertness: Awake/alert Behavior During Therapy: WFL for tasks assessed/performed Overall Cognitive Status: Impaired/Different from baseline Area of Impairment: Memory;Following commands;Safety/judgement;Problem solving                     Memory: Decreased recall of precautions;Decreased short-term memory Following Commands: Follows one step commands inconsistently;Follows one step commands with increased time Safety/Judgement: Decreased awareness of safety;Decreased awareness of deficits   Problem Solving: Slow processing;Difficulty sequencing;Requires verbal cues        Exercises      General  Comments        Pertinent Vitals/Pain Pain Assessment: Faces Faces Pain Scale: Hurts even more Pain Location: back Pain Descriptors / Indicators: Discomfort;Grimacing;Sore Pain Intervention(s): Repositioned;Monitored during session    Home Living                      Prior Function            PT Goals (current goals  can now be found in the care plan section) Acute Rehab PT Goals PT Goal Formulation: With patient/family Time For Goal Achievement: 06/09/19 Potential to Achieve Goals: Good Progress towards PT goals: Progressing toward goals    Frequency    Min 3X/week      PT Plan Current plan remains appropriate    Co-evaluation              AM-PAC PT "6 Clicks" Mobility   Outcome Measure  Help needed turning from your back to your side while in a flat bed without using bedrails?: A Little Help needed moving from lying on your back to sitting on the side of a flat bed without using bedrails?: A Little Help needed moving to and from a bed to a chair (including a wheelchair)?: A Little Help needed standing up from a chair using your arms (e.g., wheelchair or bedside chair)?: A Little Help needed to walk in hospital room?: A Little Help needed climbing 3-5 steps with a railing? : A Lot 6 Click Score: 17    End of Session Equipment Utilized During Treatment: Back brace;Gait belt Activity Tolerance: Patient limited by fatigue Patient left: in chair;with call bell/phone within reach;with family/visitor present Nurse Communication: Mobility status PT Visit Diagnosis: Unsteadiness on feet (R26.81);History of falling (Z91.81);Muscle weakness (generalized) (M62.81)     Time: [id number] PT Time Calculation (min) (ACUTE ONLY): 21 min  Charges:  $Gait Training: 8-22 mins                     Anastasio Champion, DPT  Acute Rehabilitation Services Pager [phone] Office [place] 06/03/2019, 12:27 PM

## 2019-06-03 NOTE — TOC Progression Note (Signed)
Transition of Care [facility]) - Progression Note    Patient Details  Name: [name] [name] MRN: [id number] Date of Birth: 1930-03-06  Transition of Care [facility]) CM/SW [place], [place] Phone Number: 06/03/2019, 10:14 AM  Clinical Narrative:    Continue to await insurance authorization. Pt son to complete paperwork at [facility] this morning. When auth received will assist with d/c, new COVID has resulted negative.     Expected Discharge Plan: [place] Barriers to Discharge: Continued Medical Work up  Expected Discharge Plan and Services Expected Discharge Plan: [place]   Discharge Planning Services: CM Consult Post Acute Care Choice: [place] Living arrangements for the past 2 months: Single Family Home Expected Discharge Date: 06/03/19      Readmission Risk Interventions Readmission Risk Prevention Plan 06/02/2019  Transportation Screening Complete  PCP or Specialist Appt within 5-7 Days Not Complete  Not Complete comments plan for SNF  Home Care Screening Complete  Medication Review (RN CM) Referral to Pharmacy  Some recent data might be hidden

## 2019-06-10 ENCOUNTER — Emergency Department (HOSPITAL_COMMUNITY): Payer: Medicare HMO

## 2019-06-10 ENCOUNTER — Encounter (HOSPITAL_COMMUNITY): Payer: Self-pay

## 2019-06-10 ENCOUNTER — Inpatient Hospital Stay (HOSPITAL_COMMUNITY)
Admission: EM | Admit: 2019-06-10 | Discharge: 2019-07-10 | DRG: 208 | Disposition: E | Payer: Medicare HMO | Source: Skilled Nursing Facility | Attending: Emergency Medicine | Admitting: Emergency Medicine

## 2019-06-10 ENCOUNTER — Other Ambulatory Visit: Payer: Self-pay

## 2019-06-10 DIAGNOSIS — Z01818 Encounter for other preprocedural examination: Secondary | ICD-10-CM

## 2019-06-10 DIAGNOSIS — I2699 Other pulmonary embolism without acute cor pulmonale: Secondary | ICD-10-CM | POA: Diagnosis not present

## 2019-06-10 DIAGNOSIS — Z955 Presence of coronary angioplasty implant and graft: Secondary | ICD-10-CM | POA: Diagnosis not present

## 2019-06-10 DIAGNOSIS — E872 Acidosis: Secondary | ICD-10-CM | POA: Diagnosis present

## 2019-06-10 DIAGNOSIS — R570 Cardiogenic shock: Secondary | ICD-10-CM | POA: Diagnosis present

## 2019-06-10 DIAGNOSIS — R2681 Unsteadiness on feet: Secondary | ICD-10-CM | POA: Diagnosis present

## 2019-06-10 DIAGNOSIS — I272 Pulmonary hypertension, unspecified: Secondary | ICD-10-CM | POA: Diagnosis present

## 2019-06-10 DIAGNOSIS — E875 Hyperkalemia: Secondary | ICD-10-CM | POA: Diagnosis present

## 2019-06-10 DIAGNOSIS — Z794 Long term (current) use of insulin: Secondary | ICD-10-CM

## 2019-06-10 DIAGNOSIS — J9601 Acute respiratory failure with hypoxia: Secondary | ICD-10-CM | POA: Diagnosis present

## 2019-06-10 DIAGNOSIS — Z8249 Family history of ischemic heart disease and other diseases of the circulatory system: Secondary | ICD-10-CM

## 2019-06-10 DIAGNOSIS — R0602 Shortness of breath: Secondary | ICD-10-CM | POA: Diagnosis not present

## 2019-06-10 DIAGNOSIS — R296 Repeated falls: Secondary | ICD-10-CM | POA: Diagnosis present

## 2019-06-10 DIAGNOSIS — M4854XA Collapsed vertebra, not elsewhere classified, thoracic region, initial encounter for fracture: Secondary | ICD-10-CM | POA: Diagnosis present

## 2019-06-10 DIAGNOSIS — Z515 Encounter for palliative care: Secondary | ICD-10-CM | POA: Diagnosis not present

## 2019-06-10 DIAGNOSIS — N179 Acute kidney failure, unspecified: Secondary | ICD-10-CM | POA: Diagnosis present

## 2019-06-10 DIAGNOSIS — E119 Type 2 diabetes mellitus without complications: Secondary | ICD-10-CM | POA: Diagnosis present

## 2019-06-10 DIAGNOSIS — Z7982 Long term (current) use of aspirin: Secondary | ICD-10-CM

## 2019-06-10 DIAGNOSIS — K7200 Acute and subacute hepatic failure without coma: Secondary | ICD-10-CM | POA: Diagnosis not present

## 2019-06-10 DIAGNOSIS — K7689 Other specified diseases of liver: Secondary | ICD-10-CM | POA: Diagnosis not present

## 2019-06-10 DIAGNOSIS — Z452 Encounter for adjustment and management of vascular access device: Secondary | ICD-10-CM

## 2019-06-10 DIAGNOSIS — Z20822 Contact with and (suspected) exposure to covid-19: Secondary | ICD-10-CM | POA: Diagnosis present

## 2019-06-10 DIAGNOSIS — Z8673 Personal history of transient ischemic attack (TIA), and cerebral infarction without residual deficits: Secondary | ICD-10-CM | POA: Diagnosis not present

## 2019-06-10 DIAGNOSIS — Z952 Presence of prosthetic heart valve: Secondary | ICD-10-CM

## 2019-06-10 DIAGNOSIS — J90 Pleural effusion, not elsewhere classified: Secondary | ICD-10-CM | POA: Diagnosis not present

## 2019-06-10 DIAGNOSIS — M316 Other giant cell arteritis: Secondary | ICD-10-CM | POA: Diagnosis present

## 2019-06-10 DIAGNOSIS — E7800 Pure hypercholesterolemia, unspecified: Secondary | ICD-10-CM | POA: Diagnosis present

## 2019-06-10 DIAGNOSIS — T462X5A Adverse effect of other antidysrhythmic drugs, initial encounter: Secondary | ICD-10-CM | POA: Diagnosis not present

## 2019-06-10 DIAGNOSIS — R7401 Elevation of levels of liver transaminase levels: Secondary | ICD-10-CM

## 2019-06-10 DIAGNOSIS — I4819 Other persistent atrial fibrillation: Secondary | ICD-10-CM | POA: Diagnosis present

## 2019-06-10 DIAGNOSIS — I361 Nonrheumatic tricuspid (valve) insufficiency: Secondary | ICD-10-CM | POA: Diagnosis not present

## 2019-06-10 DIAGNOSIS — I459 Conduction disorder, unspecified: Secondary | ICD-10-CM | POA: Diagnosis present

## 2019-06-10 DIAGNOSIS — M4854XD Collapsed vertebra, not elsewhere classified, thoracic region, subsequent encounter for fracture with routine healing: Secondary | ICD-10-CM | POA: Diagnosis present

## 2019-06-10 DIAGNOSIS — F329 Major depressive disorder, single episode, unspecified: Secondary | ICD-10-CM | POA: Diagnosis present

## 2019-06-10 DIAGNOSIS — E861 Hypovolemia: Secondary | ICD-10-CM | POA: Diagnosis present

## 2019-06-10 DIAGNOSIS — I708 Atherosclerosis of other arteries: Secondary | ICD-10-CM | POA: Diagnosis present

## 2019-06-10 DIAGNOSIS — E1151 Type 2 diabetes mellitus with diabetic peripheral angiopathy without gangrene: Secondary | ICD-10-CM | POA: Diagnosis present

## 2019-06-10 DIAGNOSIS — M1990 Unspecified osteoarthritis, unspecified site: Secondary | ICD-10-CM | POA: Diagnosis present

## 2019-06-10 DIAGNOSIS — I2510 Atherosclerotic heart disease of native coronary artery without angina pectoris: Secondary | ICD-10-CM | POA: Diagnosis present

## 2019-06-10 DIAGNOSIS — I4892 Unspecified atrial flutter: Secondary | ICD-10-CM | POA: Diagnosis present

## 2019-06-10 DIAGNOSIS — W1830XA Fall on same level, unspecified, initial encounter: Secondary | ICD-10-CM | POA: Diagnosis present

## 2019-06-10 DIAGNOSIS — I2693 Single subsegmental pulmonary embolism without acute cor pulmonale: Secondary | ICD-10-CM | POA: Diagnosis present

## 2019-06-10 DIAGNOSIS — Z7401 Bed confinement status: Secondary | ICD-10-CM

## 2019-06-10 DIAGNOSIS — K529 Noninfective gastroenteritis and colitis, unspecified: Secondary | ICD-10-CM | POA: Diagnosis present

## 2019-06-10 DIAGNOSIS — Z888 Allergy status to other drugs, medicaments and biological substances status: Secondary | ICD-10-CM

## 2019-06-10 DIAGNOSIS — R54 Age-related physical debility: Secondary | ICD-10-CM | POA: Diagnosis present

## 2019-06-10 DIAGNOSIS — S52502D Unspecified fracture of the lower end of left radius, subsequent encounter for closed fracture with routine healing: Secondary | ICD-10-CM | POA: Diagnosis not present

## 2019-06-10 DIAGNOSIS — Z79899 Other long term (current) drug therapy: Secondary | ICD-10-CM

## 2019-06-10 DIAGNOSIS — Z66 Do not resuscitate: Secondary | ICD-10-CM | POA: Diagnosis not present

## 2019-06-10 DIAGNOSIS — I119 Hypertensive heart disease without heart failure: Secondary | ICD-10-CM | POA: Diagnosis present

## 2019-06-10 DIAGNOSIS — Z883 Allergy status to other anti-infective agents status: Secondary | ICD-10-CM

## 2019-06-10 DIAGNOSIS — Z87440 Personal history of urinary (tract) infections: Secondary | ICD-10-CM

## 2019-06-10 DIAGNOSIS — I712 Thoracic aortic aneurysm, without rupture: Secondary | ICD-10-CM | POA: Diagnosis present

## 2019-06-10 DIAGNOSIS — E8770 Fluid overload, unspecified: Secondary | ICD-10-CM | POA: Diagnosis present

## 2019-06-10 DIAGNOSIS — E785 Hyperlipidemia, unspecified: Secondary | ICD-10-CM | POA: Diagnosis present

## 2019-06-10 DIAGNOSIS — S52502A Unspecified fracture of the lower end of left radius, initial encounter for closed fracture: Secondary | ICD-10-CM | POA: Diagnosis present

## 2019-06-10 DIAGNOSIS — I340 Nonrheumatic mitral (valve) insufficiency: Secondary | ICD-10-CM | POA: Diagnosis not present

## 2019-06-10 LAB — COMPREHENSIVE METABOLIC PANEL
ALT: 336 U/L — ABNORMAL HIGH (ref 0–44)
AST: 197 U/L — ABNORMAL HIGH (ref 15–41)
Albumin: 2.3 g/dL — ABNORMAL LOW (ref 3.5–5.0)
Alkaline Phosphatase: 308 U/L — ABNORMAL HIGH (ref 38–126)
Anion gap: 19 — ABNORMAL HIGH (ref 5–15)
BUN: 69 mg/dL — ABNORMAL HIGH (ref 8–23)
CO2: 15 mmol/L — ABNORMAL LOW (ref 22–32)
Calcium: 8.3 mg/dL — ABNORMAL LOW (ref 8.9–10.3)
Chloride: 98 mmol/L (ref 98–111)
Creatinine, Ser: 1.68 mg/dL — ABNORMAL HIGH (ref 0.44–1.00)
GFR calc Af Amer: 31 mL/min — ABNORMAL LOW (ref >60)
GFR calc non Af Amer: 27 mL/min — ABNORMAL LOW (ref >60)
Glucose, Bld: 174 mg/dL — ABNORMAL HIGH (ref 70–99)
Potassium: 5.8 mmol/L — ABNORMAL HIGH (ref 3.5–5.1)
Sodium: 132 mmol/L — ABNORMAL LOW (ref 135–145)
Total Bilirubin: 2.1 mg/dL — ABNORMAL HIGH (ref 0.3–1.2)
Total Protein: 5.1 g/dL — ABNORMAL LOW (ref 6.5–8.1)

## 2019-06-10 LAB — HEMOGLOBIN A1C
Hgb A1c MFr Bld: 7.5 % — ABNORMAL HIGH (ref 4.8–5.6)
Mean Plasma Glucose: 168.55 mg/dL

## 2019-06-10 LAB — CBC WITH DIFFERENTIAL/PLATELET
Abs Immature Granulocytes: 0.19 10*3/uL — ABNORMAL HIGH (ref 0.00–0.07)
Basophils Absolute: 0 10*3/uL (ref 0.0–0.1)
Basophils Relative: 0 %
Eosinophils Absolute: 0 10*3/uL (ref 0.0–0.5)
Eosinophils Relative: 0 %
HCT: 41 % (ref 36.0–46.0)
Hemoglobin: 12.6 g/dL (ref 12.0–15.0)
Immature Granulocytes: 1 %
Lymphocytes Relative: 3 %
Lymphs Abs: 0.6 10*3/uL — ABNORMAL LOW (ref 0.7–4.0)
MCH: 26.9 pg (ref 26.0–34.0)
MCHC: 30.7 g/dL (ref 30.0–36.0)
MCV: 87.4 fL (ref 80.0–100.0)
Monocytes Absolute: 1.1 10*3/uL — ABNORMAL HIGH (ref 0.1–1.0)
Monocytes Relative: 6 %
Neutro Abs: 15.8 10*3/uL — ABNORMAL HIGH (ref 1.7–7.7)
Neutrophils Relative %: 90 %
Platelets: 283 10*3/uL (ref 150–400)
RBC: 4.69 MIL/uL (ref 3.87–5.11)
RDW: 15.7 % — ABNORMAL HIGH (ref 11.5–15.5)
WBC: 17.7 10*3/uL — ABNORMAL HIGH (ref 4.0–10.5)
nRBC: 1.6 % — ABNORMAL HIGH (ref 0.0–0.2)

## 2019-06-10 LAB — I-STAT CHEM 8, ED
BUN: 61 mg/dL — ABNORMAL HIGH (ref 8–23)
Calcium, Ion: 0.98 mmol/L — ABNORMAL LOW (ref 1.15–1.40)
Chloride: 102 mmol/L (ref 98–111)
Creatinine, Ser: 1.4 mg/dL — ABNORMAL HIGH (ref 0.44–1.00)
Glucose, Bld: 163 mg/dL — ABNORMAL HIGH (ref 70–99)
HCT: 42 % (ref 36.0–46.0)
Hemoglobin: 14.3 g/dL (ref 12.0–15.0)
Potassium: 5.5 mmol/L — ABNORMAL HIGH (ref 3.5–5.1)
Sodium: 130 mmol/L — ABNORMAL LOW (ref 135–145)
TCO2: 17 mmol/L — ABNORMAL LOW (ref 22–32)

## 2019-06-10 LAB — RESPIRATORY PANEL BY RT PCR (FLU A&B, COVID)
Influenza A by PCR: NEGATIVE
Influenza B by PCR: NEGATIVE
SARS Coronavirus 2 by RT PCR: NEGATIVE

## 2019-06-10 LAB — POCT I-STAT 7, (LYTES, BLD GAS, ICA,H+H)
Acid-base deficit: 8 mmol/L — ABNORMAL HIGH (ref 0.0–2.0)
Bicarbonate: 14 mmol/L — ABNORMAL LOW (ref 20.0–28.0)
Calcium, Ion: 1.11 mmol/L — ABNORMAL LOW (ref 1.15–1.40)
HCT: 41 % (ref 36.0–46.0)
Hemoglobin: 13.9 g/dL (ref 12.0–15.0)
O2 Saturation: 100 %
Potassium: 5.3 mmol/L — ABNORMAL HIGH (ref 3.5–5.1)
Sodium: 130 mmol/L — ABNORMAL LOW (ref 135–145)
TCO2: 15 mmol/L — ABNORMAL LOW (ref 22–32)
pCO2 arterial: 22.3 mmHg — ABNORMAL LOW (ref 32.0–48.0)
pH, Arterial: 7.408 (ref 7.350–7.450)
pO2, Arterial: 173 mmHg — ABNORMAL HIGH (ref 83.0–108.0)

## 2019-06-10 LAB — TROPONIN I (HIGH SENSITIVITY)
Troponin I (High Sensitivity): 5371 ng/L (ref <18)
Troponin I (High Sensitivity): 6469 ng/L (ref <18)

## 2019-06-10 LAB — BRAIN NATRIURETIC PEPTIDE: B Natriuretic Peptide: 1467.3 pg/mL — ABNORMAL HIGH (ref 0.0–100.0)

## 2019-06-10 LAB — LACTIC ACID, PLASMA: Lactic Acid, Venous: 9.2 mmol/L (ref 0.5–1.9)

## 2019-06-10 LAB — D-DIMER, QUANTITATIVE: D-Dimer, Quant: 8.2 ug/mL-FEU — ABNORMAL HIGH (ref 0.00–0.50)

## 2019-06-10 LAB — GLUCOSE, CAPILLARY: Glucose-Capillary: 163 mg/dL — ABNORMAL HIGH (ref 70–99)

## 2019-06-10 MED ORDER — VANCOMYCIN HCL 500 MG/100ML IV SOLN: 500.0000 mg | INTRAVENOUS | Status: DC

## 2019-06-10 MED ORDER — FUROSEMIDE 10 MG/ML IJ SOLN: 20.0000 mg | Freq: Once | INTRAMUSCULAR | Status: DC

## 2019-06-10 MED ORDER — VANCOMYCIN HCL IN DEXTROSE 1-5 GM/200ML-% IV SOLN
1000.0000 mg | Freq: Once | INTRAVENOUS | Status: AC
  Administered 2019-06-10: 1000 mg via INTRAVENOUS
  Filled 2019-06-10: qty 200

## 2019-06-10 MED ORDER — HEPARIN BOLUS VIA INFUSION
2500.0000 [IU] | Freq: Once | INTRAVENOUS | Status: AC
  Administered 2019-06-10: 2500 [IU] via INTRAVENOUS
  Filled 2019-06-10: qty 2500

## 2019-06-10 MED ORDER — SODIUM CHLORIDE 0.9 % IV SOLN
2.0000 g | Freq: Once | INTRAVENOUS | Status: AC
  Administered 2019-06-10: 2 g via INTRAVENOUS
  Filled 2019-06-10: qty 2

## 2019-06-10 MED ORDER — BUDESONIDE 3 MG PO CPEP
6.0000 mg | ORAL_CAPSULE | Freq: Every day | ORAL | Status: DC
  Administered 2019-06-11: 6 mg via ORAL
  Filled 2019-06-10: qty 2

## 2019-06-10 MED ORDER — INSULIN ASPART 100 UNIT/ML ~~LOC~~ SOLN: 0.0000 [IU] | Freq: Three times a day (TID) | SUBCUTANEOUS | Status: DC

## 2019-06-10 MED ORDER — SODIUM CHLORIDE 0.9 % IV SOLN: 1000.0000 mL | INTRAVENOUS | Status: DC

## 2019-06-10 MED ORDER — ASPIRIN 81 MG PO CHEW
243.0000 mg | CHEWABLE_TABLET | ORAL | Status: AC
  Administered 2019-06-11: 243 mg via ORAL
  Filled 2019-06-10: qty 3

## 2019-06-10 MED ORDER — INSULIN ASPART 100 UNIT/ML ~~LOC~~ SOLN
5.0000 [IU] | Freq: Once | SUBCUTANEOUS | Status: AC
  Administered 2019-06-10: 5 [IU] via INTRAVENOUS

## 2019-06-10 MED ORDER — SODIUM ZIRCONIUM CYCLOSILICATE 5 G PO PACK
5.0000 g | PACK | Freq: Once | ORAL | Status: DC
  Filled 2019-06-10: qty 1

## 2019-06-10 MED ORDER — AMIODARONE HCL 200 MG PO TABS: 100.0000 mg | ORAL_TABLET | Freq: Every day | ORAL | Status: DC

## 2019-06-10 MED ORDER — ASPIRIN EC 81 MG PO TBEC: 81.0000 mg | DELAYED_RELEASE_TABLET | Freq: Every day | ORAL | Status: DC

## 2019-06-10 MED ORDER — SODIUM BICARBONATE 8.4 % IV SOLN
50.0000 meq | Freq: Once | INTRAVENOUS | Status: AC
  Administered 2019-06-10: 50 meq via INTRAVENOUS
  Filled 2019-06-10: qty 50

## 2019-06-10 MED ORDER — NOREPINEPHRINE 4 MG/250ML-% IV SOLN
INTRAVENOUS | Status: AC
  Filled 2019-06-10: qty 250

## 2019-06-10 MED ORDER — IOHEXOL 350 MG/ML SOLN
150.0000 mL | Freq: Once | INTRAVENOUS | Status: AC | PRN
  Administered 2019-06-10: 150 mL via INTRAVENOUS

## 2019-06-10 MED ORDER — NOREPINEPHRINE 4 MG/250ML-% IV SOLN
0.0000 ug/min | INTRAVENOUS | Status: DC
  Administered 2019-06-11: 20 ug/min via INTRAVENOUS
  Administered 2019-06-11: 14 ug/min via INTRAVENOUS
  Administered 2019-06-11: 4 ug/min via INTRAVENOUS
  Administered 2019-06-11: 40 ug/min via INTRAVENOUS
  Filled 2019-06-10 (×4): qty 250

## 2019-06-10 MED ORDER — ATORVASTATIN CALCIUM 10 MG PO TABS: 10.0000 mg | ORAL_TABLET | Freq: Every day | ORAL | Status: DC

## 2019-06-10 MED ORDER — DULOXETINE HCL 30 MG PO CPEP: 30.0000 mg | ORAL_CAPSULE | Freq: Every day | ORAL | Status: DC

## 2019-06-10 MED ORDER — DEXTROSE 50 % IV SOLN
25.0000 mL | Freq: Once | INTRAVENOUS | Status: AC
  Administered 2019-06-10: 25 mL via INTRAVENOUS
  Filled 2019-06-10: qty 50

## 2019-06-10 MED ORDER — HEPARIN (PORCINE) 25000 UT/250ML-% IV SOLN
800.0000 [IU]/h | INTRAVENOUS | Status: DC
  Administered 2019-06-10: 500 [IU]/h via INTRAVENOUS
  Filled 2019-06-10: qty 250

## 2019-06-10 MED ORDER — ATORVASTATIN CALCIUM 40 MG PO TABS: 80.0000 mg | ORAL_TABLET | Freq: Every day | ORAL | Status: DC

## 2019-06-10 MED ORDER — SODIUM CHLORIDE 0.9 % IV BOLUS (SEPSIS): 1000.0000 mL | Freq: Once | INTRAVENOUS | Status: DC

## 2019-06-10 MED ORDER — SODIUM CHLORIDE 0.9 % IV SOLN
1.0000 g | Freq: Once | INTRAVENOUS | Status: AC
  Administered 2019-06-10: 1 g via INTRAVENOUS
  Filled 2019-06-10: qty 10

## 2019-06-10 NOTE — ED Triage Notes (Signed)
Pt bib ems, reporting that the pt had some shortness of breath at the nursing home with some chest pain. Pt is AOx4 upon arrival with non-rebreather 12L

## 2019-06-10 NOTE — Progress Notes (Signed)
CRITICAL VALUE ALERT  Critical Value:  Lactic Acid 9.2  Date & Time Notied:  06/28/2019 @ 2355  Provider Notified: Warren Lacy MD  Jackalyn Lombard

## 2019-06-10 NOTE — H&P (Addendum)
NAME:  [name] [name], MRN:  [id number], DOB:  [birth year], LOS: 0 ADMISSION DATE:  07/08/2019, CONSULTATION DATE:  4/1 REFERRING MD:  Dr. Wilson Singer EDP, CHIEF COMPLAINT:  Hypoxia   Brief History   86 year old female admitted 3/15 - 3/25 after fall for surgical repair of L radius. Now presenting from SNF with complaints of dyspnea. Found to have segmental PE required 100% NRB.   History of present illness   86 year old female with past medical history as below, which is significant for atrial fibrillation (recently on warfarin, stopped due to fall), CAD, CVA, Giant cell arteritis, and subclavian artery stenosis. She was recently admitted to [facility] from 3/15-3/25 under the trauma service after suffering a ground level fall resulting in fracture of distal left radius, sternum, and compression fractures of T7/L4/L5. The radius fracture was repaired via ORIF and the other fractures were managed conservatively. She was also treated with three days of ceftriaxone for ESBL UTI. She was discharged to SNF  On 3/15 she was found to be in extremis related to respiratory distress. Pulse ox found to be in 70s on room air. She was started on oxygen and transported to [facility] ED. She was initially requiring NRB, but was able to be weaned to 6L and remained with sats in mid 72s. She was also noted to be hypotensive refractory to 1 L IVF and was started on levophed, which was eventually weaned off. Imaging of the chest concerning for segmental PE and bilateral pleural effusions. PCCM asked to admit.   Past Medical History   has a past medical history of Arthritis, Atrial fibrillation ([place]), Back pain, Cancer ([place]), Complication of anesthesia, Coronary artery disease, CVA (cerebral vascular accident) ([place]), Depression, Disc disorder, Dyspnea, Dysrhythmia, Fatigue, Giant cell arteritis ([place]), H/O: hysterectomy, Heart murmur, HTN (hypertension), Hypercholesteremia, PONV (postoperative nausea and vomiting), Subclavian artery  stenosis, right ([place]), and TIA (transient ischemic attack).   Significant Hospital Events   3/15-3/25 admit for fall/ fractures 4/1 admit for hypoxemic failure, PE/effusions.   Consults:  Pulm Cardiology  Procedures:    Significant Diagnostic Tests:  CTA chest 4/1 > acute PE with segmental and subsegmental emboli in both lower lobes and right upper lobe. Cardiomegaly with biatrial enlargement. Contrast refluxes into the hepatic veins and IVC. Aortic atherosclerosis and fusiform aneurysmal dilatation of the ascending aorta, maximal dimension 4.3 cm. Moderate bilateral pleural effusions with adjacent compressive atelectasis.   Micro Data:  COVID 4/1 Blood 4/1 >  Antimicrobials:  Cefepime (ED) Vancomycin (ED)  Interim history/subjective:    Objective   Blood pressure (!) 108/54, pulse 83, resp. rate (!) 22, SpO2 (!) 77 %.       No intake or output data in the 24 hours ending 06/20/2019 1753 There were no vitals filed for this visit.  Examination: General: Elderly female in NAD on nasal cannula HENT: [place]/AT, PERRL, no JVD Lungs: Diminished in bases, otherwise clear. Cardiovascular: RRR, no MRG.  Abdomen: Soft, non-tender, non-distended Extremities: No acute deformity or ROM limitation Neuro: Alert, oriented, non-focal.   Resolved Hospital Problem list     Assessment & Plan:   Acute hypoxemic respiratory failure: Multifactorial in the setting of segmental PE, bilateral pleural effusions, and pulmonary edema. Cannot rule out infectious process.  - Supplemental O2 to keep SpO2 > 92% - Incentive spirometry and mobilize as able. - Hold off on antibiotics, cultures pending.   Pulmonary embolism: Segmental and subsegmental PE to bilateral lower and right upper lobe.  -  She is not a candidate for long term anticoagulation unless she will be in monitored SNF facility where her risk of falling is low - Will start heparin infusion for now, unclear how long she can be on [facility]  safely.  - Lower extremity dopplers will not change therapy - Echocardiogram  Bilateral pleural effusions: Felt to represent volume overload in the setting of elevated R heart pressures as approximated on CT.  - Gentle diuresis - Repeat CXR in AM  Troponin elevation: likely due to PE. EKG without obvious ischemia - Trend trop - cardiology consulted by ED.   AKI Hyperkalemia - Temporized in ED - Gentle diuresis - Repeat BMP later tonight.   Hypertension - holding lisinopril for now  Atrial fibrillation - Anticoagulation as above (was recently stopped due to falls) - Continue home amiodarone  Inflammatory bowel disease - continue home budesonide  DM - CBG monitoring and SSI  Aneurysmal dilatation of the ascending aorta, maximal dimension 4.3 cm - outpatient follow up   Best practice:  Diet: heart healthy Pain/Anxiety/Delirium protocol (if indicated): NA VAP protocol (if indicated): NA DVT prophylaxis: heparin infusion GI prophylaxis: NA Glucose control: SSI Mobility: BR Code Status: FULL Family Communication: Daughter updated in ED Disposition: ICU overnight  Labs   CBC: Recent Labs  Lab 06/18/2019 1516 07/07/2019 1538 06/28/2019 1539  WBC 17.7*  --   --   NEUTROABS 15.8*  --   --   HGB 12.6 13.9 14.3  HCT 41.0 41.0 42.0  MCV 87.4  --   --   PLT 283  --   --     Basic Metabolic Panel: Recent Labs  Lab 06/26/2019 1516 07/07/2019 1538 07/03/2019 1539  NA 132* 130* 130*  K 5.8* 5.3* 5.5*  CL 98  --  102  CO2 15*  --   --   GLUCOSE 174*  --  163*  BUN 69*  --  61*  CREATININE 1.68*  --  1.40*  CALCIUM 8.3*  --   --    GFR: Estimated Creatinine Clearance: 18.1 mL/min (A) (by C-G formula based on SCr of 1.4 mg/dL (H)). Recent Labs  Lab 07/09/2019 1516  WBC 17.7*    Liver Function Tests: Recent Labs  Lab 07/04/2019 1516  AST 197*  ALT 336*  ALKPHOS 308*  BILITOT 2.1*  PROT 5.1*  ALBUMIN 2.3*   No results for input(s): LIPASE, AMYLASE in the last  168 hours. No results for input(s): AMMONIA in the last 168 hours.  ABG    Component Value Date/Time   PHART 7.408 06/20/2019 1538   PCO2ART 22.3 (L) 07/03/2019 1538   PO2ART 173.0 (H) 07/06/2019 1538   HCO3 14.0 (L) 06/15/2019 1538   TCO2 17 (L) 06/16/2019 1539   ACIDBASEDEF 8.0 (H) 06/19/2019 1538   O2SAT 100.0 06/26/2019 1538     Coagulation Profile: No results for input(s): INR, PROTIME in the last 168 hours.  Cardiac Enzymes: No results for input(s): CKTOTAL, CKMB, CKMBINDEX, TROPONINI in the last 168 hours.  HbA1C: Hgb A1c MFr Bld  Date/Time Value Ref Range Status  06/17/2015 05:32 AM 7.6 (H) 4.8 - 5.6 % Final    Comment:    (NOTE)         Pre-diabetes: 5.7 - 6.4         Diabetes: >6.4         Glycemic control for adults with diabetes: <7.0     CBG: No results for input(s): GLUCAP in the last 168 hours.  Review of Systems:   Bolds are positive  Constitutional: weight loss, gain, night sweats, Fevers, chills, fatigue .  HEENT: headaches, Sore throat, sneezing, nasal congestion, post nasal drip, Difficulty swallowing, Tooth/dental problems, visual complaints visual changes, ear ache CV:  chest pain, radiates:,Orthopnea, PND, swelling in lower extremities, dizziness, palpitations, syncope.  GI  heartburn, indigestion, abdominal pain, nausea, vomiting, diarrhea, change in bowel habits, loss of appetite, bloody stools.  Resp: cough, productive: , hemoptysis, dyspnea, chest pain, pleuritic.  Skin: rash or itching or icterus GU: dysuria, change in color of urine, urgency or frequency. flank pain, hematuria  MS: joint pain or swelling. decreased range of motion  Psych: change in mood or affect. depression or anxiety.  Neuro: difficulty with speech, weakness, numbness, ataxia    Past Medical History  She,  has a past medical history of Arthritis, Atrial fibrillation ([place]), Back pain, Cancer ([place]), Complication of anesthesia, Coronary artery disease, CVA (cerebral  vascular accident) ([place]), Depression, Disc disorder, Dyspnea, Dysrhythmia, Fatigue, Giant cell arteritis ([place]), H/O: hysterectomy, Heart murmur, HTN (hypertension), Hypercholesteremia, PONV (postoperative nausea and vomiting), Subclavian artery stenosis, right ([place]), and TIA (transient ischemic attack).   Surgical History    Past Surgical History:  Procedure Laterality Date  . ABDOMINAL HYSTERECTOMY    . APPENDECTOMY    . CARDIAC VALVE REPLACEMENT     08/28/16  . CARDIOVERSION N/A 08/18/2015   Procedure: CARDIOVERSION;  Surgeon: Dorothy Spark, MD;  Location: [place];  Service: Cardiovascular;  Laterality: N/A;  . CATARACT EXTRACTION W/PHACO Left 03/10/2017   Procedure: CATARACT EXTRACTION PHACO AND INTRAOCULAR LENS PLACEMENT ([place]);  Surgeon: Leandrew Koyanagi, MD;  Location: [facility];  Service: Ophthalmology;  Laterality: Left;  US01:16.5 AP%21.1 [id number] FLUID LOT # [id number]  . CATARACT EXTRACTION W/PHACO Right 04/16/2017   Procedure: CATARACT EXTRACTION PHACO AND INTRAOCULAR LENS PLACEMENT ([facility]);  Surgeon: Leandrew Koyanagi, MD;  Location: [facility];  Service: Ophthalmology;  Laterality: Right;  [place] 01:13 AP% 20.3 CDE 14.93 Fluid pack lot # [id number]  . CORONARY ANGIOPLASTY     STENT 08/21/16  . KNEE SURGERY  3/06   Left  . MOHS SURGERY    . OPEN REDUCTION INTERNAL FIXATION (ORIF) DISTAL RADIAL FRACTURE Left 05/28/2019   Procedure: OPEN REDUCTION INTERNAL FIXATION (ORIF) DISTAL RADIAL FRACTURE;  Surgeon: Dayna Barker, MD;  Location: [place];  Service: Orthopedics;  Laterality: Left;  . PROLAPSED UTERINE FIBROID LIGATION  7/05     Social History   reports that she has never smoked. She has never used smokeless tobacco. She reports that she does not drink alcohol or use drugs.   Family History   Her family history includes Hypertension in her daughter and another family member. There is no history of Stroke or Diabetes type II.   Allergies Allergies  Allergen Reactions   . Ciprofloxacin Other (See Comments)    tachycardia  . Escitalopram Nausea Only and Other (See Comments)    "lips puffed", "tight in chest", "nasuea", "dizzy"  . Ranitidine Diarrhea     Home Medications  Prior to Admission medications   Medication Sig Start Date End Date Taking? Authorizing Provider  acetaminophen (TYLENOL) 650 MG CR tablet Take 1,300 mg by mouth in the morning and at bedtime.    [provider]  amiodarone (PACERONE) 200 MG tablet TAKE 1/2 TABLET BY MOUTH DAILY Patient taking differently: Take 200 mg by mouth daily.  08/26/17   Sherran Needs, NP  aspirin 81 MG tablet Take 81 mg by mouth daily.  [provider]  atorvastatin (LIPITOR) 10 MG tablet Take 10 mg by mouth See admin instructions. Every other night    [provider]  azelastine (ASTELIN) 0.1 % nasal spray Place 1-2 sprays into both nostrils 2 (two) times daily as needed for allergies. 05/06/19   [provider]  budesonide (ENTOCORT EC) 3 MG 24 hr capsule Take 6 mg by mouth daily. 03/17/19   [provider]  DULoxetine (CYMBALTA) 30 MG capsule Take 30 mg by mouth daily.    [provider]  KLOR-CON M20 20 MEQ tablet Take 20 mEq by mouth daily. 05/19/19   [provider]  lisinopril (ZESTRIL) 5 MG tablet Take 5 mg by mouth daily.    [provider]  methocarbamol (ROBAXIN) 500 MG tablet Take 2 tablets (1,000 mg total) by mouth every 8 (eight) hours as needed for muscle spasms. 06/01/19   Saverio Danker, PA-C  mirtazapine (REMERON) 15 MG tablet Take 7.5 mg by mouth at bedtime.    [provider]  Probiotic Product (ALIGN) 4 MG CAPS Take 4 mg by mouth daily.    [provider]  sodium chloride 1 g tablet Take 2 tablets (2 g total) by mouth 3 (three) times daily with meals. 06/01/19   Saverio Danker, PA-C  traMADol (ULTRAM) 50 MG tablet Take 1 tablet (50 mg total) by mouth every 6 (six) hours as needed for moderate pain or severe  pain (50mg  for moderate pain, 100mg  for severe pain). 06/01/19   Saverio Danker, PA-C  TRESIBA FLEXTOUCH 100 UNIT/ML SOPN FlexTouch Pen Inject 7 Units into the skin daily.  03/18/17   [provider]  Vitamin D, Ergocalciferol, (DRISDOL) 1.25 MG (50000 UNIT) CAPS capsule Take 50,000 Units by mouth once a week. 05/06/19   [provider]      Georgann Housekeeper, AGACNP-BC [place]  See Amion for personal pager PCCM on call pager [phone]  06/24/2019 6:30 PM

## 2019-06-10 NOTE — Progress Notes (Signed)
ANTICOAGULATION CONSULT NOTE - Initial Consult  Pharmacy Consult for Heparin Indication: chest pain/ACS   Allergies  Allergen Reactions  . Ciprofloxacin Other (See Comments)    tachycardia  . Escitalopram Nausea Only and Other (See Comments)    "lips puffed", "tight in chest", "nasuea", "dizzy"  . Ranitidine Diarrhea    Patient Measurements:   Heparin Dosing Weight: 41.3 kg  Vital Signs:    Labs: Recent Labs    06/28/2019 1516 07/06/2019 1516 07/08/2019 1538 07/04/2019 1539  HGB 12.6   < > 13.9 14.3  HCT 41.0  --  41.0 42.0  PLT 283  --   --   --   CREATININE 1.68*  --   --  1.40*  TROPONINIHS 5,371*  --   --   --    < > = values in this interval not displayed.    Estimated Creatinine Clearance: 18.1 mL/min (A) (by C-G formula based on SCr of 1.4 mg/dL (H)).   Medical History: Past Medical History:  Diagnosis Date  . Arthritis   . Atrial fibrillation (HCC)    persistent, coumadin per PCP;  echo 06/06/09: EF 60%, mild LVH, Gr 1 diast dysfx, AV gradient mean 9, mod MR, mod LAE;  Myoview 11/09: normal, EF 81%  . Back pain   . Cancer ([place])   . Complication of anesthesia   . Coronary artery disease   . CVA (cerebral vascular accident) ([place])    2006  . Depression   . Disc disorder    STEROID INJECTION 12/09/16  . Dyspnea    DOE  . Dysrhythmia   . Fatigue   . Giant cell arteritis [facility])    sees dr. at [facility]  . H/O: hysterectomy   . Heart murmur   . HTN (hypertension)   . Hypercholesteremia    Secondary to vascular disease  . PONV (postoperative nausea and vomiting)    DID FINE WITH CATARACT SURGERY  . Subclavian artery stenosis, right (HCC)    dopplers done 2/12;  carotids 2/12: 0-39% bilateral  . TIA (transient ischemic attack)     Assessment: 86 yo F recently discharged from hospital 7 days ago now presents with chest pain and elevated troponin. Does have a history of atrial fibrillation on Coumadin, but Coumadin was discontinued due to high risk of  falls/bleeding. Pharmacy consulted to start heparin. CBC wnl, D-dimer elevated at 8.20.   Goal of Therapy:  Heparin level 0.3-0.7 units/ml Monitor platelets by anticoagulation protocol: Yes   Plan:  - Bolus Heparin IV 2500 units once - Start Heparin IV gtt at 500 units/hr - Check 8-hr HL - Daily HL and CBC - Monitor for signs/symptoms of bleeding  Richardine Service, PharmD PGY1 Pharmacy Resident Phone: [phone] 07/08/2019  5:26 PM  Please check [email or web address] for unit-specific pharmacy phone numbers.   Richardine Service 06/20/2019,5:17 PM

## 2019-06-10 NOTE — ED Provider Notes (Signed)
[facility] EMERGENCY DEPARTMENT Provider Note   CSN: [id number] Arrival date & time: 06/10/2019  1440     History Chief Complaint  Patient presents with  . Shortness of Breath    [name] [name] is a 86 y.o. female.  HPI Patient was discharged from the hospital 7 days ago.  She had been admitted after ground-level fall with a distal left radius fracture with surgical management, sternal fracture without intervention and thoracic and lumbar compression fractures.  Patient had history of A. fib on Coumadin but Coumadin was discontinued due to fall risk and bleeding risk.  Patient was going to physical therapy today.  She got there and reportedly got chest pain or shortness of breath.  Apparently the nurses at therapy were not able to get an oxygen saturation reading.  They had applied a mask but did not have oxygen flowing.  EMS arrived and got saturation readings in the seventies.  She was placed on a nonrebreather mask with resolution to high nineties.  Patient reports she did have chest pain but is denying it at this time.  Nurses report patient was normotensive upon arrival by EMS.  Patient was transition to the stretcher.  Nursing was evaluating and had put the patient supine in set her up.  Her pressures dropped to the low seventies and nursing reports difficulty getting a oxygen saturation reading with waveform. The patient's resuscitation initiated at that time by myself.  I have also spoken with the patient's daughter.  Reportedly the patient was doing reasonably well post discharge.    Past Medical History:  Diagnosis Date  . Arthritis   . Atrial fibrillation (HCC)    persistent, coumadin per PCP;  echo 06/06/09: EF 60%, mild LVH, Gr 1 diast dysfx, AV gradient mean 9, mod MR, mod LAE;  Myoview 11/09: normal, EF 81%  . Back pain   . Cancer ([place])   . Complication of anesthesia   . Coronary artery disease   . CVA (cerebral vascular accident) ([place])    2006  .  Depression   . Disc disorder    STEROID INJECTION 12/09/16  . Dyspnea    DOE  . Dysrhythmia   . Fatigue   . Giant cell arteritis [facility])    sees dr. at [facility]  . H/O: hysterectomy   . Heart murmur   . HTN (hypertension)   . Hypercholesteremia    Secondary to vascular disease  . PONV (postoperative nausea and vomiting)    DID FINE WITH CATARACT SURGERY  . Subclavian artery stenosis, right (HCC)    dopplers done 2/12;  carotids 2/12: 0-39% bilateral  . TIA (transient ischemic attack)     Patient Active Problem List   Diagnosis Date Noted  . Malnutrition of moderate degree 05/28/2019  . Pressure injury of skin 05/27/2019  . Compressed spine fracture ([place]) 05/25/2019  . Near syncope 06/17/2015  . Pre-syncope 06/17/2015  . Anticoagulant long-term use 08/27/2010  . Chest pain, unspecified 06/29/2010  . MURMUR 05/17/2009  . VASCULITIS 11/23/2008  . HYPERCHOLESTEROLEMIA 05/25/2008  . Essential hypertension 05/25/2008  . ATRIAL FIBRILLATION 05/25/2008  . CVA 05/25/2008  . TIA 05/25/2008  . GIANT CELL ARTERITIS 05/25/2008  . FATIGUE 05/25/2008  . Palpitations 05/25/2008  . DYSPNEA 05/25/2008    Past Surgical History:  Procedure Laterality Date  . ABDOMINAL HYSTERECTOMY    . APPENDECTOMY    . CARDIAC VALVE REPLACEMENT     08/28/16  . CARDIOVERSION N/A 08/18/2015   Procedure:  CARDIOVERSION;  Surgeon: Dorothy Spark, MD;  Location: [place];  Service: Cardiovascular;  Laterality: N/A;  . CATARACT EXTRACTION W/PHACO Left 03/10/2017   Procedure: CATARACT EXTRACTION PHACO AND INTRAOCULAR LENS PLACEMENT ([place]);  Surgeon: Leandrew Koyanagi, MD;  Location: [facility];  Service: Ophthalmology;  Laterality: Left;  US01:16.5 AP%21.1 [id number] FLUID LOT # [id number]  . CATARACT EXTRACTION W/PHACO Right 04/16/2017   Procedure: CATARACT EXTRACTION PHACO AND INTRAOCULAR LENS PLACEMENT (I[facility]);  Surgeon: Leandrew Koyanagi, MD;  Location: [facility];  Service: Ophthalmology;  Laterality: Right;   [place] 01:13 AP% 20.3 CDE 14.93 Fluid pack lot # [id number]  . CORONARY ANGIOPLASTY     STENT 08/21/16  . KNEE SURGERY  3/06   Left  . MOHS SURGERY    . OPEN REDUCTION INTERNAL FIXATION (ORIF) DISTAL RADIAL FRACTURE Left 05/28/2019   Procedure: OPEN REDUCTION INTERNAL FIXATION (ORIF) DISTAL RADIAL FRACTURE;  Surgeon: Dayna Barker, MD;  Location: [place];  Service: Orthopedics;  Laterality: Left;  . PROLAPSED UTERINE FIBROID LIGATION  7/05     OB History   No obstetric history on file.     Family History  Problem Relation Age of Onset  . Hypertension Daughter   . Hypertension Other   . Stroke Neg Hx   . Diabetes type II Neg Hx     Social History   Tobacco Use  . Smoking status: Never Smoker  . Smokeless tobacco: Never Used  Substance Use Topics  . Alcohol use: No  . Drug use: No    Home Medications Prior to Admission medications   Medication Sig Start Date End Date Taking? Authorizing Provider  acetaminophen (TYLENOL) 650 MG CR tablet Take 1,300 mg by mouth in the morning and at bedtime.    [provider]  amiodarone (PACERONE) 200 MG tablet TAKE 1/2 TABLET BY MOUTH DAILY Patient taking differently: Take 200 mg by mouth daily.  08/26/17   Sherran Needs, NP  aspirin 81 MG tablet Take 81 mg by mouth daily.    [provider]  atorvastatin (LIPITOR) 10 MG tablet Take 10 mg by mouth See admin instructions. Every other night    [provider]  azelastine (ASTELIN) 0.1 % nasal spray Place 1-2 sprays into both nostrils 2 (two) times daily as needed for allergies. 05/06/19   [provider]  budesonide (ENTOCORT EC) 3 MG 24 hr capsule Take 6 mg by mouth daily. 03/17/19   [provider]  DULoxetine (CYMBALTA) 30 MG capsule Take 30 mg by mouth daily.    [provider]  KLOR-CON M20 20 MEQ tablet Take 20 mEq by mouth daily. 05/19/19   [provider]  lisinopril (ZESTRIL) 5 MG tablet Take 5 mg by mouth daily.    [provider]  methocarbamol (ROBAXIN) 500 MG tablet Take 2 tablets (1,000 mg total) by mouth every 8 (eight) hours as needed for muscle spasms. 06/01/19   Saverio Danker, PA-C  mirtazapine (REMERON) 15 MG tablet Take 7.5 mg by mouth at bedtime.    [provider]  Probiotic Product (ALIGN) 4 MG CAPS Take 4 mg by mouth daily.    [provider]  sodium chloride 1 g tablet Take 2 tablets (2 g total) by mouth 3 (three) times daily with meals. 06/01/19   Saverio Danker, PA-C  traMADol (ULTRAM) 50 MG tablet Take 1 tablet (50 mg total) by mouth every 6 (six) hours as needed for moderate pain or severe pain (50mg  for moderate pain, 100mg  for  severe pain). 06/01/19   Saverio Danker, PA-C  TRESIBA FLEXTOUCH 100 UNIT/ML SOPN FlexTouch Pen Inject 7 Units into the skin daily.  03/18/17   [provider]  Vitamin D, Ergocalciferol, (DRISDOL) 1.25 MG (50000 UNIT) CAPS capsule Take 50,000 Units by mouth once a week. 05/06/19   [provider]    Allergies    Ciprofloxacin, Escitalopram, and Ranitidine  Review of Systems   Review of Systems Level 5 caveat cannot obtain due to patient condition. Physical Exam Updated Vital Signs There were no vitals taken for this visit.  Physical Exam Constitutional:      Comments: Patient is pale in appearance.  She has mild increased work of breathing but no severe respiratory distress.  She is alert and answering questions appropriately.  HENT:     Head: Normocephalic and atraumatic.  Eyes:     Extraocular Movements: Extraocular movements intact.  Neck:     Comments: Positive JVD. Cardiovascular:     Comments: Heart is regular.  Heart sounds are very distant and soft.  Cannot appreciate gross murmur or gallop. Pulmonary:     Comments: Mild work of breathing.  Crackles at mid lung fields and soft at the bases. Abdominal:     General: There is no distension.     Palpations: Abdomen is soft.     Tenderness: There is no abdominal  tenderness. There is no guarding.  Musculoskeletal:     Comments: No peripheral edema.  No swelling of the feet or the lower legs.  Feet are cool and pulses are not palpable to touch.  Femoral pulses intact.  Calves are soft.  Patient has a splint on the right lower arm.  Skin:    General: Skin is warm and dry.     Coloration: Skin is pale.  Neurological:     General: No focal deficit present.     Mental Status: She is oriented to person, place, and time.     Coordination: Coordination normal.     Comments: Patient's mental status is alert and answering questions appropriately.  Speech is situationally appropriate.  She does not have focal motor deficit.     ED Results / Procedures / Treatments   Labs (all labs ordered are listed, but only abnormal results are displayed) Labs Reviewed  COMPREHENSIVE METABOLIC PANEL - Abnormal; Notable for the following components:      Result Value   Sodium 132 (*)    Potassium 5.8 (*)    CO2 15 (*)    Glucose, Bld 174 (*)    BUN 69 (*)    Creatinine, Ser 1.68 (*)    Calcium 8.3 (*)    Total Protein 5.1 (*)    Albumin 2.3 (*)    AST 197 (*)    ALT 336 (*)    Alkaline Phosphatase 308 (*)    Total Bilirubin 2.1 (*)    GFR calc non Af Amer 27 (*)    GFR calc Af Amer 31 (*)    Anion gap 19 (*)    All other components within normal limits  BRAIN NATRIURETIC PEPTIDE - Abnormal; Notable for the following components:   B Natriuretic Peptide 1,467.3 (*)    All other components within normal limits  D-DIMER, QUANTITATIVE (NOT AT [facility]) - Abnormal; Notable for the following components:   D-Dimer, Quant 8.20 (*)    All other components within normal limits  CBC WITH DIFFERENTIAL/PLATELET - Abnormal; Notable for the following components:   WBC 17.7 (*)  RDW 15.7 (*)    nRBC 1.6 (*)    Neutro Abs 15.8 (*)    Lymphs Abs 0.6 (*)    Monocytes Absolute 1.1 (*)    Abs Immature Granulocytes 0.19 (*)    All other components within normal limits   I-STAT CHEM 8, ED - Abnormal; Notable for the following components:   Sodium 130 (*)    Potassium 5.5 (*)    BUN 61 (*)    Creatinine, Ser 1.40 (*)    Glucose, Bld 163 (*)    Calcium, Ion 0.98 (*)    TCO2 17 (*)    All other components within normal limits  POCT I-STAT 7, (LYTES, BLD GAS, ICA,H+H) - Abnormal; Notable for the following components:   pCO2 arterial 22.3 (*)    pO2, Arterial 173.0 (*)    Bicarbonate 14.0 (*)    TCO2 15 (*)    Acid-base deficit 8.0 (*)    Sodium 130 (*)    Potassium 5.3 (*)    Calcium, Ion 1.11 (*)    All other components within normal limits  TROPONIN I (HIGH SENSITIVITY) - Abnormal; Notable for the following components:   Troponin I (High Sensitivity) 5,371 (*)    All other components within normal limits  URINE CULTURE  CULTURE, BLOOD (ROUTINE X 2)  CULTURE, BLOOD (ROUTINE X 2)  RESPIRATORY PANEL BY RT PCR (FLU A&B, COVID)  LACTIC ACID, PLASMA  LACTIC ACID, PLASMA  URINALYSIS, ROUTINE W REFLEX MICROSCOPIC  HEPARIN LEVEL (UNFRACTIONATED)  CBC  HEPARIN LEVEL (UNFRACTIONATED)  TROPONIN I (HIGH SENSITIVITY)    EKG EKG Interpretation  Date/Time:  Thursday June 10 2019 14:59:46 EDT Ventricular Rate:  92 PR Interval:    QRS Duration: 156 QT Interval:  421 QTC Calculation: 521 R Axis:   -94 Text Interpretation: Atrial flutter Nonspecific IVCD with LAD rate increase but otherwise similar to previous Confirmed by Charlesetta Shanks [phone]) on 06/24/2019 4:08:58 PM   Radiology DG Chest Port 1 View  Result Date: 06/21/2019 CLINICAL DATA:  Shortness of breath EXAM: PORTABLE CHEST 1 VIEW COMPARISON:  May 24, 2019 FINDINGS: There is cardiomegaly with mild pulmonary venous hypertension. There is interstitial edema with bilateral pleural effusions. There is airspace opacity in the left base. There is an aortic valve replacement. There is aortic atherosclerosis. No adenopathy. There is evidence of an old fracture of the proximal right humerus with  remodeling. Bones are osteoporotic. IMPRESSION: Cardiomegaly with pulmonary vascular congestion. Bilateral effusions with interstitial edema. Suspect alveolar edema left base, although superimposed pneumonia in this area cannot be excluded. The overall appearance is most indicative of congestive heart failure. There is an aortic valve replacement. Aortic Atherosclerosis (ICD10-I70.0). Old fracture proximal right humerus with remodeling.  Osteoporosis. Electronically Signed   By: Lowella Grip III M.D.   On: 06/22/2019 15:28    Procedures Procedures (including critical care time) CRITICAL CARE Performed by: Charlesetta Shanks   Total critical care time: 60 minutes  Critical care time was exclusive of separately billable procedures and treating other patients.  Critical care was necessary to treat or prevent imminent or life-threatening deterioration.  Critical care was time spent personally by me on the following activities: development of treatment plan with patient and/or surrogate as well as nursing, discussions with consultants, evaluation of patient's response to treatment, examination of patient, obtaining history from patient or surrogate, ordering and performing treatments and interventions, ordering and review of laboratory studies, ordering and review of radiographic studies, pulse oximetry and re-evaluation of  patient's condition.  Angiocath insertion Performed by: Charlesetta Shanks  Consent: Verbal consent obtained. Risks and benefits: risks, benefits and alternatives were discussed Time out: Immediately prior to procedure a "time out" was called to verify the correct patient, procedure, equipment, support staff and site/side marked as required.  Preparation: Patient was prepped and draped in the usual sterile fashion.  Vein Location: Right external jugular  No ultrasound Guided  Gauge: 20 long Normal blood return and flush without difficulty Patient tolerance: Patient  tolerated the procedure well with no immediate complications.  Angiocath insertion Performed by: Charlesetta Shanks  Consent: Verbal consent obtained. Risks and benefits: risks, benefits and alternatives were discussed Time out: Immediately prior to procedure a "time out" was called to verify the correct patient, procedure, equipment, support staff and site/side marked as required.  Preparation: Patient was prepped and draped in the usual sterile fashion.  Vein Location: Left basilic  Yes ultrasound Guided  Gauge: 20 long  Normal blood return and flush without difficulty Patient tolerance: Patient tolerated the procedure well with no immediate complications.    Medications Ordered in ED Medications  norepinephrine (LEVOPHED) 4mg  in 274mL premix infusion (has no administration in time range)  sodium chloride 0.9 % bolus 1,000 mL (has no administration in time range)    Followed by  0.9 %  sodium chloride infusion (has no administration in time range)  norepinephrine (LEVOPHED) 4-5 MG/250ML-% infusion SOLN (has no administration in time range)  ceFEPIme (MAXIPIME) 2 g in sodium chloride 0.9 % 100 mL IVPB (2 g Intravenous New Bag/Given 06/19/2019 1724)  vancomycin (VANCOCIN) IVPB 1000 mg/200 mL premix (has no administration in time range)  vancomycin (VANCOREADY) IVPB 500 mg/100 mL (has no administration in time range)  heparin ADULT infusion 100 units/mL (25000 units/280mL sodium chloride 0.45%) (has no administration in time range)  heparin bolus via infusion 2,500 Units (has no administration in time range)  calcium gluconate 1 g in sodium chloride 0.9 % 100 mL IVPB (has no administration in time range)  sodium bicarbonate injection 50 mEq (has no administration in time range)  sodium zirconium cyclosilicate (LOKELMA) packet 5 g (has no administration in time range)  insulin aspart (novoLOG) injection 5 Units (has no administration in time range)  dextrose 50 % solution 25 mL (has no  administration in time range)  iohexol (OMNIPAQUE) 350 MG/ML injection 150 mL (150 mLs Intravenous Contrast Given 06/19/2019 1610)    ED Course  I have reviewed the triage vital signs and the nursing notes.  Pertinent labs & imaging results that were available during my care of the patient were reviewed by me and considered in my medical decision making (see chart for details).    MDM Rules/Calculators/A&P                       Patient presents as outlined above.  She presents in extremis with hypoxia and hypotension.  Patient does have clear mental status.  She was placed on nonrebreather mask by EMS.  We have been able to transition her to a 6 L nasal cannula oxygen.  She is maintaining oxygen saturations in the mid 90s.  Initially, patient's blood pressures were [id number] to 0000000 systolic.  Fluid resuscitation with 1 L fluids and Levophed initiated.  Systolic pressures have now increased to 120s.  Levophed was discontinued and on standby.  Patient has significant elevation in her troponin.  EKG is consistent with left bundle branch pattern.  CT PE study also  shows small peripheral PE but no large saddle embolus.  Heparin initiated for ACS and pulmonary embolus.  Cardiology consult pending. Critical care consult pending.  Dr. Wilson Singer to assume care for the patient prior to admission.  Final Clinical Impression(s) / ED Diagnoses Final diagnoses:  None    Rx / DC Orders ED Discharge Orders    None       Charlesetta Shanks, MD 06/25/2019 1729

## 2019-06-10 NOTE — Progress Notes (Signed)
Pharmacy Antibiotic Note  [name] [name] is a 86 y.o. female admitted on 07/06/2019 with pneumonia. CXR shows bilateral effusion with interstitial edema, cannot exclude PNA. WBC elevated at 17.7, Scr 1.4. Pharmacy has been consulted for Vancomycin dosing.   Plan: Vancomycin 1000mg  IV once, then 500mg  Q36 hrs (est AUC 534.8, Vd 0.72, Scr 1.4) Monitor renal function, cultures and sensitivities, and clinical progression Check vancomycin levels when indicated     No data recorded.  Recent Labs  Lab 06/16/2019 1516 06/29/2019 1539  WBC 17.7*  --   CREATININE  --  1.40*    Estimated Creatinine Clearance: 18.1 mL/min (A) (by C-G formula based on SCr of 1.4 mg/dL (H)).    Allergies  Allergen Reactions  . Ciprofloxacin Other (See Comments)    tachycardia  . Escitalopram Nausea Only and Other (See Comments)    "lips puffed", "tight in chest", "nasuea", "dizzy"  . Ranitidine Diarrhea    Antimicrobials this admission: Vancomycin 4/1 >>   Dose adjustments this admission: N/A  Microbiology results: 4/1 BCx:  4/1 UCx:    Richardine Service, PharmD PGY1 Pharmacy Resident Phone: [phone] 06/30/2019  5:01 PM  Please check [email or web address] for unit-specific pharmacy phone numbers.

## 2019-06-11 ENCOUNTER — Inpatient Hospital Stay (HOSPITAL_COMMUNITY): Payer: Medicare HMO

## 2019-06-11 DIAGNOSIS — I340 Nonrheumatic mitral (valve) insufficiency: Secondary | ICD-10-CM | POA: Diagnosis not present

## 2019-06-11 DIAGNOSIS — Z01818 Encounter for other preprocedural examination: Secondary | ICD-10-CM

## 2019-06-11 DIAGNOSIS — I361 Nonrheumatic tricuspid (valve) insufficiency: Secondary | ICD-10-CM

## 2019-06-11 DIAGNOSIS — Z452 Encounter for adjustment and management of vascular access device: Secondary | ICD-10-CM

## 2019-06-11 DIAGNOSIS — J9601 Acute respiratory failure with hypoxia: Secondary | ICD-10-CM | POA: Diagnosis not present

## 2019-06-11 DIAGNOSIS — R0602 Shortness of breath: Secondary | ICD-10-CM

## 2019-06-11 LAB — POCT I-STAT 7, (LYTES, BLD GAS, ICA,H+H)
Acid-base deficit: 11 mmol/L — ABNORMAL HIGH (ref 0.0–2.0)
Acid-base deficit: 14 mmol/L — ABNORMAL HIGH (ref 0.0–2.0)
Acid-base deficit: 19 mmol/L — ABNORMAL HIGH (ref 0.0–2.0)
Bicarbonate: 12.1 mmol/L — ABNORMAL LOW (ref 20.0–28.0)
Bicarbonate: 14.3 mmol/L — ABNORMAL LOW (ref 20.0–28.0)
Bicarbonate: 8.7 mmol/L — ABNORMAL LOW (ref 20.0–28.0)
Calcium, Ion: 1.11 mmol/L — ABNORMAL LOW (ref 1.15–1.40)
Calcium, Ion: 1.1 mmol/L — ABNORMAL LOW (ref 1.15–1.40)
Calcium, Ion: 1.11 mmol/L — ABNORMAL LOW (ref 1.15–1.40)
HCT: 41 % (ref 36.0–46.0)
HCT: 34 % — ABNORMAL LOW (ref 36.0–46.0)
HCT: 37 % (ref 36.0–46.0)
Hemoglobin: 13.9 g/dL (ref 12.0–15.0)
Hemoglobin: 11.6 g/dL — ABNORMAL LOW (ref 12.0–15.0)
Hemoglobin: 12.6 g/dL (ref 12.0–15.0)
O2 Saturation: 99 %
O2 Saturation: 100 %
O2 Saturation: 98 %
Patient temperature: 96.5
Patient temperature: 96.5
Patient temperature: 97.4
Potassium: 5.6 mmol/L — ABNORMAL HIGH (ref 3.5–5.1)
Potassium: 5.4 mmol/L — ABNORMAL HIGH (ref 3.5–5.1)
Potassium: 6.1 mmol/L — ABNORMAL HIGH (ref 3.5–5.1)
Sodium: 133 mmol/L — ABNORMAL LOW (ref 135–145)
Sodium: 134 mmol/L — ABNORMAL LOW (ref 135–145)
Sodium: 132 mmol/L — ABNORMAL LOW (ref 135–145)
TCO2: 13 mmol/L — ABNORMAL LOW (ref 22–32)
TCO2: 16 mmol/L — ABNORMAL LOW (ref 22–32)
TCO2: 10 mmol/L — ABNORMAL LOW (ref 22–32)
pCO2 arterial: 20 mmHg — ABNORMAL LOW (ref 32.0–48.0)
pCO2 arterial: 40.1 mmHg (ref 32.0–48.0)
pCO2 arterial: 24.6 mmHg — ABNORMAL LOW (ref 32.0–48.0)
pH, Arterial: 7.387 (ref 7.350–7.450)
pH, Arterial: 7.153 — CL (ref 7.350–7.450)
pH, Arterial: 7.155 — CL (ref 7.350–7.450)
pO2, Arterial: 118 mmHg — ABNORMAL HIGH (ref 83.0–108.0)
pO2, Arterial: 387 mmHg — ABNORMAL HIGH (ref 83.0–108.0)
pO2, Arterial: 136 mmHg — ABNORMAL HIGH (ref 83.0–108.0)

## 2019-06-11 LAB — LACTIC ACID, PLASMA
Lactic Acid, Venous: 9 mmol/L (ref 0.5–1.9)
Lactic Acid, Venous: >11 mmol/L (ref 0.5–1.9)

## 2019-06-11 LAB — COMPREHENSIVE METABOLIC PANEL
ALT: 434 U/L — ABNORMAL HIGH (ref 0–44)
AST: 679 U/L — ABNORMAL HIGH (ref 15–41)
Albumin: 2.3 g/dL — ABNORMAL LOW (ref 3.5–5.0)
Alkaline Phosphatase: 306 U/L — ABNORMAL HIGH (ref 38–126)
Anion gap: 22 — ABNORMAL HIGH (ref 5–15)
BUN: 67 mg/dL — ABNORMAL HIGH (ref 8–23)
CO2: 11 mmol/L — ABNORMAL LOW (ref 22–32)
Calcium: 8.6 mg/dL — ABNORMAL LOW (ref 8.9–10.3)
Chloride: 103 mmol/L (ref 98–111)
Creatinine, Ser: 1.89 mg/dL — ABNORMAL HIGH (ref 0.44–1.00)
GFR calc Af Amer: 27 mL/min — ABNORMAL LOW (ref >60)
GFR calc non Af Amer: 23 mL/min — ABNORMAL LOW (ref >60)
Glucose, Bld: 111 mg/dL — ABNORMAL HIGH (ref 70–99)
Potassium: 6.3 mmol/L (ref 3.5–5.1)
Sodium: 136 mmol/L (ref 135–145)
Total Bilirubin: 2.1 mg/dL — ABNORMAL HIGH (ref 0.3–1.2)
Total Protein: 5 g/dL — ABNORMAL LOW (ref 6.5–8.1)

## 2019-06-11 LAB — URINALYSIS, ROUTINE W REFLEX MICROSCOPIC
Bilirubin Urine: NEGATIVE
Glucose, UA: NEGATIVE mg/dL
Hgb urine dipstick: NEGATIVE
Ketones, ur: NEGATIVE mg/dL
Nitrite: NEGATIVE
Protein, ur: NEGATIVE mg/dL
Specific Gravity, Urine: >1.046 — ABNORMAL HIGH (ref 1.005–1.030)
pH: 5 (ref 5.0–8.0)

## 2019-06-11 LAB — CBC
HCT: 44.1 % (ref 36.0–46.0)
Hemoglobin: 13.2 g/dL (ref 12.0–15.0)
MCH: 26.9 pg (ref 26.0–34.0)
MCHC: 29.9 g/dL — ABNORMAL LOW (ref 30.0–36.0)
MCV: 90 fL (ref 80.0–100.0)
Platelets: 267 10*3/uL (ref 150–400)
RBC: 4.9 MIL/uL (ref 3.87–5.11)
RDW: 15.9 % — ABNORMAL HIGH (ref 11.5–15.5)
WBC: 14.8 10*3/uL — ABNORMAL HIGH (ref 4.0–10.5)
nRBC: 4 % — ABNORMAL HIGH (ref 0.0–0.2)

## 2019-06-11 LAB — HEPARIN LEVEL (UNFRACTIONATED)
Heparin Unfractionated: 0.11 IU/mL — ABNORMAL LOW (ref 0.30–0.70)
Heparin Unfractionated: 0.12 IU/mL — ABNORMAL LOW (ref 0.30–0.70)

## 2019-06-11 LAB — TROPONIN I (HIGH SENSITIVITY)
Troponin I (High Sensitivity): 7900 ng/L (ref <18)
Troponin I (High Sensitivity): 7458 ng/L (ref <18)
Troponin I (High Sensitivity): 7824 ng/L (ref <18)

## 2019-06-11 LAB — ECHOCARDIOGRAM LIMITED
Height: 60 in
Weight: 1664.91 oz

## 2019-06-11 LAB — BASIC METABOLIC PANEL
Anion gap: 20 — ABNORMAL HIGH (ref 5–15)
BUN: 69 mg/dL — ABNORMAL HIGH (ref 8–23)
CO2: 14 mmol/L — ABNORMAL LOW (ref 22–32)
Calcium: 8.4 mg/dL — ABNORMAL LOW (ref 8.9–10.3)
Chloride: 100 mmol/L (ref 98–111)
Creatinine, Ser: 1.54 mg/dL — ABNORMAL HIGH (ref 0.44–1.00)
GFR calc Af Amer: 35 mL/min — ABNORMAL LOW (ref >60)
GFR calc non Af Amer: 30 mL/min — ABNORMAL LOW (ref >60)
Glucose, Bld: 143 mg/dL — ABNORMAL HIGH (ref 70–99)
Potassium: 6.2 mmol/L — ABNORMAL HIGH (ref 3.5–5.1)
Sodium: 134 mmol/L — ABNORMAL LOW (ref 135–145)

## 2019-06-11 LAB — PATHOLOGIST SMEAR REVIEW

## 2019-06-11 LAB — MAGNESIUM: Magnesium: 2 mg/dL (ref 1.7–2.4)

## 2019-06-11 LAB — PHOSPHORUS: Phosphorus: 6.6 mg/dL — ABNORMAL HIGH (ref 2.5–4.6)

## 2019-06-11 LAB — COOXEMETRY PANEL
Carboxyhemoglobin: 0.2 % — ABNORMAL LOW (ref 0.5–1.5)
Methemoglobin: 1.2 % (ref 0.0–1.5)
O2 Saturation: 85.5 %
Total hemoglobin: 11.5 g/dL — ABNORMAL LOW (ref 12.0–16.0)

## 2019-06-11 LAB — PROTIME-INR
INR: 1.8 — ABNORMAL HIGH (ref 0.8–1.2)
Prothrombin Time: 20.5 seconds — ABNORMAL HIGH (ref 11.4–15.2)

## 2019-06-11 LAB — PROCALCITONIN: Procalcitonin: 3.91 ng/mL

## 2019-06-11 LAB — GLUCOSE, CAPILLARY
Glucose-Capillary: 139 mg/dL — ABNORMAL HIGH (ref 70–99)
Glucose-Capillary: 35 mg/dL — CL (ref 70–99)
Glucose-Capillary: 82 mg/dL (ref 70–99)
Glucose-Capillary: 87 mg/dL (ref 70–99)

## 2019-06-11 LAB — MRSA PCR SCREENING: MRSA by PCR: NEGATIVE

## 2019-06-11 MED ORDER — SODIUM BICARBONATE 8.4 % IV SOLN
50.0000 meq | Freq: Once | INTRAVENOUS | Status: AC
  Administered 2019-06-11: 50 meq via INTRAVENOUS

## 2019-06-11 MED ORDER — MIDAZOLAM HCL 2 MG/2ML IJ SOLN: 1.0000 mg | INTRAMUSCULAR | Status: DC | PRN

## 2019-06-11 MED ORDER — INSULIN ASPART 100 UNIT/ML ~~LOC~~ SOLN: 0.0000 [IU] | SUBCUTANEOUS | Status: DC

## 2019-06-11 MED ORDER — AMIODARONE HCL 200 MG PO TABS
100.0000 mg | ORAL_TABLET | Freq: Every day | ORAL | Status: DC
  Administered 2019-06-11: 100 mg
  Filled 2019-06-11: qty 1

## 2019-06-11 MED ORDER — CALCIUM GLUCONATE-NACL 2-0.675 GM/100ML-% IV SOLN
2.0000 g | Freq: Once | INTRAVENOUS | Status: AC
  Administered 2019-06-11: 2000 mg via INTRAVENOUS
  Filled 2019-06-11: qty 100

## 2019-06-11 MED ORDER — DOCUSATE SODIUM 50 MG/5ML PO LIQD: 100.0000 mg | Freq: Two times a day (BID) | ORAL | Status: DC | PRN

## 2019-06-11 MED ORDER — FENTANYL 2500MCG IN NS 250ML (10MCG/ML) PREMIX INFUSION
0.0000 ug/h | INTRAVENOUS | Status: DC
  Filled 2019-06-11: qty 250

## 2019-06-11 MED ORDER — CHLORHEXIDINE GLUCONATE CLOTH 2 % EX PADS
6.0000 | MEDICATED_PAD | Freq: Every day | CUTANEOUS | Status: DC
  Administered 2019-06-11: 6 via TOPICAL

## 2019-06-11 MED ORDER — DOBUTAMINE IN D5W 4-5 MG/ML-% IV SOLN
2.5000 ug/kg/min | INTRAVENOUS | Status: DC
  Administered 2019-06-11: 2.5 ug/kg/min via INTRAVENOUS
  Filled 2019-06-11: qty 250

## 2019-06-11 MED ORDER — PANTOPRAZOLE SODIUM 40 MG IV SOLR
40.0000 mg | Freq: Every day | INTRAVENOUS | Status: DC
  Administered 2019-06-11: 40 mg via INTRAVENOUS
  Filled 2019-06-11: qty 40

## 2019-06-11 MED ORDER — ASPIRIN 81 MG PO CHEW
81.0000 mg | CHEWABLE_TABLET | Freq: Every day | ORAL | Status: DC
  Filled 2019-06-11: qty 1

## 2019-06-11 MED ORDER — FUROSEMIDE 10 MG/ML IJ SOLN
40.0000 mg | Freq: Once | INTRAMUSCULAR | Status: AC
  Administered 2019-06-11: 40 mg via INTRAVENOUS
  Filled 2019-06-11: qty 4

## 2019-06-11 MED ORDER — LACTATED RINGERS IV BOLUS
1000.0000 mL | Freq: Once | INTRAVENOUS | Status: AC
  Administered 2019-06-11: 1000 mL via INTRAVENOUS

## 2019-06-11 MED ORDER — CHLORHEXIDINE GLUCONATE 0.12% ORAL RINSE (MEDLINE KIT)
15.0000 mL | Freq: Two times a day (BID) | OROMUCOSAL | Status: DC
  Administered 2019-06-11: 15 mL via OROMUCOSAL

## 2019-06-11 MED ORDER — SODIUM CHLORIDE 0.9 % IV SOLN
INTRAVENOUS | Status: DC | PRN
  Administered 2019-06-11: 250 mL via INTRAVENOUS

## 2019-06-11 MED ORDER — DEXTROSE 50 % IV SOLN
INTRAVENOUS | Status: AC
  Administered 2019-06-11: 50 mL
  Filled 2019-06-11: qty 50

## 2019-06-11 MED ORDER — FENTANYL CITRATE (PF) 100 MCG/2ML IJ SOLN
50.0000 ug | Freq: Once | INTRAMUSCULAR | Status: AC
  Administered 2019-06-11: 50 ug via INTRAVENOUS

## 2019-06-11 MED ORDER — HEPARIN BOLUS VIA INFUSION
1400.0000 [IU] | Freq: Once | INTRAVENOUS | Status: AC
  Administered 2019-06-11: 1400 [IU] via INTRAVENOUS
  Filled 2019-06-11: qty 1400

## 2019-06-11 MED ORDER — ORAL CARE MOUTH RINSE: 15.0000 mL | OROMUCOSAL | Status: DC

## 2019-06-11 MED ORDER — BISACODYL 10 MG RE SUPP: 10.0000 mg | Freq: Every day | RECTAL | Status: DC | PRN

## 2019-06-11 MED ORDER — FENTANYL CITRATE (PF) 100 MCG/2ML IJ SOLN
INTRAMUSCULAR | Status: AC
  Filled 2019-06-11: qty 2

## 2019-06-11 MED ORDER — STERILE WATER FOR INJECTION IV SOLN
INTRAVENOUS | Status: DC
  Filled 2019-06-11: qty 850

## 2019-06-11 MED ORDER — FENTANYL CITRATE (PF) 100 MCG/2ML IJ SOLN
100.0000 ug | Freq: Once | INTRAMUSCULAR | Status: AC
  Administered 2019-06-11: 100 ug via INTRAVENOUS

## 2019-06-11 MED ORDER — CHLORHEXIDINE GLUCONATE 0.12% ORAL RINSE (MEDLINE KIT): 15.0000 mL | Freq: Two times a day (BID) | OROMUCOSAL | Status: DC

## 2019-06-11 MED ORDER — HEPARIN BOLUS VIA INFUSION
1500.0000 [IU] | Freq: Once | INTRAVENOUS | Status: AC
  Administered 2019-06-11: 1500 [IU] via INTRAVENOUS
  Filled 2019-06-11: qty 1500

## 2019-06-11 MED ORDER — SODIUM ZIRCONIUM CYCLOSILICATE 5 G PO PACK
10.0000 g | PACK | ORAL | Status: DC
  Administered 2019-06-11 (×2): 10 g via ORAL
  Filled 2019-06-11: qty 2

## 2019-06-11 MED ORDER — FENTANYL CITRATE (PF) 100 MCG/2ML IJ SOLN: 25.0000 ug | Freq: Once | INTRAMUSCULAR | Status: DC

## 2019-06-11 MED ORDER — SODIUM ZIRCONIUM CYCLOSILICATE 5 G PO PACK
10.0000 g | PACK | Freq: Three times a day (TID) | ORAL | Status: DC
  Filled 2019-06-11: qty 2

## 2019-06-11 MED ORDER — ROCURONIUM BROMIDE 50 MG/5ML IV SOLN
100.0000 mg | Freq: Once | INTRAVENOUS | Status: AC
  Administered 2019-06-11: 100 mg via INTRAVENOUS
  Filled 2019-06-11: qty 10

## 2019-06-11 MED ORDER — FENTANYL BOLUS VIA INFUSION
25.0000 ug | INTRAVENOUS | Status: DC | PRN
  Filled 2019-06-11: qty 25

## 2019-06-11 MED ORDER — SODIUM BICARBONATE 8.4 % IV SOLN
INTRAVENOUS | Status: AC
  Filled 2019-06-11: qty 50

## 2019-06-11 MED ORDER — ORAL CARE MOUTH RINSE
15.0000 mL | OROMUCOSAL | Status: DC
  Administered 2019-06-11 (×4): 15 mL via OROMUCOSAL

## 2019-06-11 MED ORDER — ROCURONIUM BROMIDE 10 MG/ML (PF) SYRINGE
PREFILLED_SYRINGE | INTRAVENOUS | Status: AC
  Filled 2019-06-11: qty 10

## 2019-06-11 MED ORDER — FENTANYL 2500MCG IN NS 250ML (10MCG/ML) PREMIX INFUSION
25.0000 ug/h | INTRAVENOUS | Status: DC
  Administered 2019-06-11: 25 ug/h via INTRAVENOUS

## 2019-06-12 LAB — URINE CULTURE: Culture: <10000 — AB

## 2019-06-12 LAB — CALCIUM, IONIZED: Calcium, Ionized, Serum: 4.4 mg/dL — ABNORMAL LOW (ref 4.5–5.6)

## 2019-06-15 LAB — CULTURE, BLOOD (ROUTINE X 2)
Culture: NO GROWTH
Culture: NO GROWTH

## 2019-06-15 LAB — GLUCOSE, CAPILLARY: Glucose-Capillary: 42 mg/dL — CL (ref 70–99)

## 2019-07-10 NOTE — Progress Notes (Signed)
CDS notified of TOD. Referral # [id number]. Full release to funeral home. Notified bedside staff.

## 2019-07-10 NOTE — Progress Notes (Signed)
[facility] for Heparin Indication: chest pain/ACS   Allergies  Allergen Reactions  . Ciprofloxacin Other (See Comments)    tachycardia  . Escitalopram Nausea Only and Other (See Comments)    "lips puffed", "tight in chest", "nasuea", "dizzy"  . Ranitidine Diarrhea    Patient Measurements: Height: 5' (152.4 cm) Weight: 47.2 kg (104 lb 0.9 oz) IBW/kg (Calculated) : 45.5 Heparin Dosing Weight: 47.2 kg  Vital Signs: Temp: 96.5 F (35.8 C) (04/02 0024) Temp Source: Axillary (04/02 0024) BP: 139/94 (04/02 0130) Pulse Rate: 80 (04/02 0130)  Labs: Recent Labs    06/25/2019 1516 06/22/2019 1538 07/07/2019 1539 06/29/2019 1539 06/15/2019 1836 06-24-19 0000 06/24/2019 0104 June 24, 2019 0239  HGB 12.6   < > 14.3   < >  --   --  13.9 13.2  HCT 41.0   < > 42.0  --   --   --  41.0 44.1  PLT 283  --   --   --   --   --   --  267  LABPROT  --   --   --   --   --   --   --  20.5*  INR  --   --   --   --   --   --   --  1.8*  HEPARINUNFRC  --   --   --   --   --   --   --  0.11*  CREATININE 1.68*  --  1.40*  --   --  1.54*  --   --   TROPONINIHS 5,371*  --   --   --  6,469*  --   --   --    < > = values in this interval not displayed.    Estimated Creatinine Clearance: 18.1 mL/min (A) (by C-G formula based on SCr of 1.54 mg/dL (H)).   Medical History: Past Medical History:  Diagnosis Date  . Arthritis   . Atrial fibrillation (HCC)    persistent, coumadin per PCP;  echo 06/06/09: EF 60%, mild LVH, Gr 1 diast dysfx, AV gradient mean 9, mod MR, mod LAE;  Myoview 11/09: normal, EF 81%  . Back pain   . Cancer ([place])   . Complication of anesthesia   . Coronary artery disease   . CVA (cerebral vascular accident) ([place])    2006  . Depression   . Disc disorder    STEROID INJECTION 12/09/16  . Dyspnea    DOE  . Dysrhythmia   . Fatigue   . Giant cell arteritis [facility])    [facility] at [facility]  . H/O: hysterectomy   . Heart murmur   . HTN (hypertension)   .  Hypercholesteremia    Secondary to vascular disease  . PONV (postoperative nausea and vomiting)    DID FINE WITH CATARACT SURGERY  . Subclavian artery stenosis, right (HCC)    dopplers done 2/12;  carotids 2/12: 0-39% bilateral  . TIA (transient ischemic attack)     Assessment: 86 yo F recently discharged from hospital 7 days ago (admitted 3/15-3/25 after fall for surgical repair of wrist) now presents with chest pain and elevated troponin. Does have a history of atrial fibrillation on Coumadin, but Coumadin was discontinued due to high risk of falls/bleeding. Pharmacy consulted to start heparin. CTA showed acute bilateral PE, small total clot burden. CBC wnl, D-dimer elevated at 8.20. INR 1.8.  Initial heparin level subtherapeutic at 0.11. CBC wnl.  No bleeding or issues with infusion per discussion with RN.  Goal of Therapy:  Heparin level 0.3-0.7 units/ml Monitor platelets by anticoagulation protocol: Yes   Plan:  Heparin 1400 unit bolus x 1 Increase heparin IV to 650 units/hr Check 6hr heparin level Monitor daily heparin level and CBC, s/sx bleeding   Arturo Morton, PharmD, BCPS Please check AMION for all [facility] contact numbers Clinical Pharmacist 06-22-2019 3:13 AM

## 2019-07-10 NOTE — Progress Notes (Signed)
[place] for Heparin Indication: chest pain/ACS   Allergies  Allergen Reactions  . Ciprofloxacin Other (See Comments)    tachycardia  . Escitalopram Nausea Only and Other (See Comments)    "lips puffed", "tight in chest", "nasuea", "dizzy"  . Ranitidine Diarrhea    Patient Measurements: Height: 5' (152.4 cm) Weight: 47.2 kg (104 lb 0.9 oz) IBW/kg (Calculated) : 45.5 Heparin Dosing Weight: 47.2 kg  Vital Signs: Temp: 96.2 F (35.7 C) (04/02 1145) Temp Source: Axillary (04/02 1145) BP: 105/14 (04/02 1215) Pulse Rate: 78 (04/02 1215)  Labs: Recent Labs    07/02/2019 1516 07/05/2019 1516 06/21/2019 1538 07/07/2019 1539 06/30/2019 1836 07/08/19 0000 07/08/19 0104 07-08-2019 0239 July 08, 2019 0239 08-Jul-2019 0413 07-08-2019 0544 2019/07/08 1009 Jul 08, 2019 1020 07/08/19 1030  HGB 12.6  --    < > 14.3  --   --    < > 13.2   < > 11.6*  --  12.6  --   --   HCT 41.0  --    < > 42.0  --   --    < > 44.1  --  34.0*  --  37.0  --   --   PLT 283  --   --   --   --   --   --  267  --   --   --   --   --   --   LABPROT  --   --   --   --   --   --   --  20.5*  --   --   --   --   --   --   INR  --   --   --   --   --   --   --  1.8*  --   --   --   --   --   --   HEPARINUNFRC  --   --   --   --   --   --   --  0.11*  --   --   --   --  0.12*  --   CREATININE 1.68*  --    < > 1.40*  --  1.54*  --  1.89*  --   --   --   --   --   --   TROPONINIHS 5,371*   < >  --   --  [id number]*  --   --   --   --   --  7,900*  --   --  7,458*   < > = values in this interval not displayed.    Estimated Creatinine Clearance: 14.8 mL/min (A) (by C-G formula based on SCr of 1.89 mg/dL (H)).   Assessment: 86 yo F recently discharged from hospital 7 days ago (admitted 3/15-3/25 after fall for surgical repair of wrist) now presents with chest pain and elevated troponin. Does have a history of atrial fibrillation on Coumadin, but Coumadin was discontinued due to high risk of  falls/bleeding. Pharmacy consulted to start heparin. CTA showed acute bilateral PE, small total clot burden.   Heparin level remains sub-therapeutic.  No issue with infusion nor bleeding per RN.  Goal of Therapy:  Heparin level 0.3-0.7 units/ml Monitor platelets by anticoagulation protocol: Yes   Plan:  Heparin 1500 units IV bolus, then Increase heparin gtt to 800 units/hr Daily heparin level and CBC  Welma Mccombs D. Mina Marble, PharmD, BCPS, [place] 07-08-19, 12:53 PM

## 2019-07-10 NOTE — Progress Notes (Signed)
Echocardiogram 2D Echocardiogram has been performed.  [name] [name] 09-Jul-2019, 9:37 AM

## 2019-07-10 NOTE — Progress Notes (Signed)
   2019-07-09 1500  Clinical Encounter Type  Visited With Family;Health care provider  Visit Type Patient actively dying  Referral From Nurse  Consult/Referral To Chaplain  Stress Factors  Patient Stress Factors Loss  Family Stress Factors Major life changes   Chaplain responded to end of life family support. Daughter told Chaplain there are no needs at this time. Daughter said, "just say a silent prayer." Chaplain said silent prayer outside the ICU room 14. Chaplains remain available for support as needs arise.   Chaplain Resident, Evelene Croon, M Div [phone] on-call pager

## 2019-07-10 NOTE — Progress Notes (Signed)
eLink Physician-Brief Progress Note Patient Name: [name] [name] DOB: 11/03/1929 MRN: [id number]   Date of Service  06-17-19  HPI/Events of Note  Reviewed CXR  eICU Interventions  OK to use right IJ CVL and OG tube.  RT to retract ETT by 2 cm as the tip of the ETT is only <\= 1cm from carina.     Intervention Category Intermediate Interventions: Diagnostic test evaluation  Marily Lente Verley Pariseau Jun 17, 2019, 3:35 AM

## 2019-07-10 NOTE — Progress Notes (Signed)
eLink Physician-Brief Progress Note Patient Name: [name] [name] DOB: 1929/10/24 MRN: [id number]   Date of Service  2019/06/28  HPI/Events of Note  ABG    Component Value Date/Time   PHART 7.153 (LL) 06-28-2019 0413   PCO2ART 40.1 06-28-2019 0413   PO2ART 387.0 (H) 06-28-2019 0413   HCO3 14.3 (L) 2019/06/28 0413   TCO2 16 (L) Jun 28, 2019 0413   ACIDBASEDEF 14.0 (H) 06-28-19 0413   O2SAT 100.0 06-28-19 0413   Vent Mode: PRVC FiO2 (%):  [40 %-100 %] 50 % Set Rate:  [16 bmp] 16 bmp Vt Set:  [400 mL] 400 mL PEEP:  [5 cmH20] 5 cmH20   eICU Interventions  Spoke to RN. Increase RR to 24. Decrease FiO2 to 50%, weaning further as tolerated.  Recheck ABG in ~3 hours.  RN already repleting calcium (iCal low).  Continue dobutamine drip at 2.5 mcg/kg/min. Patient's UOP has improved since we started this.      Intervention Category Major Interventions: Acid-Base disturbance - evaluation and management;Respiratory failure - evaluation and management  [name] 06/28/2019, 4:43 AM

## 2019-07-10 NOTE — Progress Notes (Signed)
Patient is now DNR. Per MD Byrum- to not continue to lab draws. Will continue to follow up

## 2019-07-10 NOTE — Progress Notes (Signed)
Initial Nutrition Assessment  DOCUMENTATION CODES:   Not applicable  INTERVENTION:   Recommend begin TF via OGT:   Vital AF 1.2 at 40 ml/h (960 ml per day)   Provides 1152 kcal, 72 gm protein, 779 ml free water daily   NUTRITION DIAGNOSIS:   Inadequate oral intake related to inability to eat as evidenced by NPO status.  GOAL:   Patient will meet greater than or equal to 90% of their needs  MONITOR:   Vent status, Skin, Labs, I & O's  REASON FOR ASSESSMENT:   Ventilator    ASSESSMENT:   86 yo female admitted with bilateral pleural effusion, required intubation early morning of 4/2. PMH includes A fib, CAD, CVA, giant cell arteritis, recent hospitalization for fall with radius, sternum, and compression fractures.   Patient is currently intubated on ventilator support MV: 9.6 L/min Temp (24hrs), Avg:96.6 F (35.9 C), Min:95.8 F (35.4 C), Max:97.4 F (36.3 C)   Labs reviewed. Na 134 (L), K 5.4 (H) down from 6.3 with Lokelma, phos 6.6 (H) CBG's: 87-82  Medications reviewed and include levophed, novolog, lokelma.   On most recent admission (d/c 3/25), patient was diagnosed with moderate PCM. She had been eating poorly and had lost weight. She had mild-moderate muscle and fat depletion on NFPE. Unable to complete NFPE at this time, but suspect malnutrition is ongoing.  Current weight is likely above actual weight, given edema. Per RN documentation, she has mild pitting edema to BLE and deep pitting edema to LUE.  NUTRITION - FOCUSED PHYSICAL EXAM:  unable to complete  Diet Order:   Diet Order            Diet NPO time specified  Diet effective now              EDUCATION NEEDS:   Not appropriate for education at this time  Skin:  Skin Assessment: Skin Integrity Issues: Skin Integrity Issues:: Stage I, Incisions Stage I: sacrum Incisions: R arm  Last BM:  no BM dcoumented  Height:   Ht Readings from Last 1 Encounters:  06/25/2019 5' (1.524 m)     Weight:   Wt Readings from Last 1 Encounters:  06-16-2019 47.2 kg    Ideal Body Weight:  45.5 kg  BMI:  Body mass index is 20.32 kg/m.  Estimated Nutritional Needs:   Kcal:  [id number]  Protein:  70-80 gm  Fluid:  > 1.2 L    Molli Barrows, RD, LDN, CNSC Please refer to Amion for contact information.

## 2019-07-10 NOTE — Procedures (Signed)
Central Venous Catheter Insertion Procedure Note [name] [name] [id number] 1929-08-16  Procedure: Insertion of Central Venous Catheter Indications: Assessment of intravascular volume, Drug and/or fluid administration and Frequent blood sampling  Procedure Details Consent: Unable to obtain consent because of emergent medical necessity. Time Out: Verified patient identification, verified procedure, site/side was marked, verified correct patient position, special equipment/implants available, medications/allergies/relevent history reviewed, required imaging and test results available.  Performed  Maximum sterile technique was used including antiseptics, cap, gloves, gown, hand hygiene, mask and sheet. Skin prep: Chlorhexidine; local anesthetic administered A antimicrobial bonded/coated triple lumen catheter was placed in the right internal jugular vein using the Seldinger technique.  Evaluation Blood flow good Complications: No apparent complications Patient did tolerate procedure well. Chest X-ray ordered to verify placement.  CXR: normal.  Kristen D Scatliffe 2019/06/17, 3:43 AM

## 2019-07-10 NOTE — Progress Notes (Addendum)
Patient passed away at 1602. Verified with 2 nurses with auscultation. EKG strip printed out. Family at bedside. Funeral arrangements made. Provided family with chaplain services and emotional support.

## 2019-07-10 NOTE — Progress Notes (Signed)
RN and charge nurse Brien Mates wasted 36mcg of fentanyl in narcotics disposal given during emergency intubation procedure.   Jackalyn Lombard

## 2019-07-10 NOTE — Progress Notes (Signed)
CRITICAL VALUE ALERT  Critical Value:  K-6.3   Date & Time Notied:  Jun 21, 2019 @ [id number]  Provider Notified: Dr. Gillermina Phy  Orders Received/Actions taken: Lokelma to be ordered

## 2019-07-10 NOTE — Progress Notes (Signed)
eLink Physician-Brief Progress Note Patient Name: [name] [name] DOB: 30-Jun-1929 MRN: [id number]   Date of Service  06/28/19  HPI/Events of Note  63F with hx of Afib (A/C stopped recently due to frequent falls), giant cell arteritis, CVA who was recently admitted (3/15-3/25/21) after mechanical fall c/b sternal fracture, T7/L4/L5 compression fractures and L distal radius fracture (now s/p ORIF).  She went to PT today and got chest pain and shortness of breath. EMS called and found the patient to be hypoxemic to 70s (improved to 90s with NRB). Found to be hypotensive in ED. CTA showed acute PE (segmental and subsegmental emboli in bilateral lower lobes and RUL, small total clot burden per their read). Also showed reflux of contrast into IVC suggestive of elevated right heart pressures, and bilateral pleural effusions.  She was started on a heparin drip and admitted to the ICU for monitoring.  In ICU, I note that her troponin is very elevated which may be secondary to heart strain from PE vs coronary ischemia. Cardiology consulted and recommended ASA/heparin drip and echo in AM. Serial troponins.  Her lactate just came back at 9.2.  eICU Interventions   # Resp: - ABG to ensure patient is compensating sufficiently for presumed lactic acidosis. - Heparin drip for PE for now.  # Cardiac: - ASA/statin/heparin drip for possible ischemic event. - Cardiology consulted and following. - Echo in AM. - Serial troponins. - Start dobutamine drip for shock of suspected cardiogenic vs obstructive etiology. - Lasix 40mg  IV now  # Renal: - Hyperkalemia tx: calcium gluconate, lokelma, and treatment of cause of metabolic acidosis + loop diuretic.  DVT PPX: Heparin drip Code Status: Full.     Intervention Category Evaluation Type: New Patient Evaluation  [name] 2019-06-28, 12:28 AM

## 2019-07-10 NOTE — Progress Notes (Signed)
NAME:  [name] [name], MRN:  [id number], DOB:  [birth year], LOS: 1 ADMISSION DATE:  06/19/2019, CONSULTATION DATE:  06/24/2019 REFERRING MD: Dr. Wilson Singer EDP CHIEF COMPLAINT:  Hypoxia  Brief History   86 year old female admitted 3/15 - 3/25 after fall for surgical repair of L radius. Now presenting from SNF with complaints of dyspnea. Found to have segmental PE required 100% NRB.   History of present illness   86 year old female with past medical history as below, which is significant for atrial fibrillation (recently on warfarin, stopped due to fall), CAD, CVA, Giant cell arteritis, and subclavian artery stenosis. She was recently admitted to [facility] from 3/15-3/25 under the trauma service after suffering a ground level fall resulting in fracture of distal left radius, sternum, and compression fractures of T7/L4/L5. The radius fracture was repaired via ORIF and the other fractures were managed conservatively. She was also treated with three days of ceftriaxone for ESBL UTI. She was discharged to SNF  On 3/15 she was found to be in extremis related to respiratory distress. Pulse ox found to be in 70s on room air. She was started on oxygen and transported to [facility] ED. She was initially requiring NRB, but was able to be weaned to 6L and remained with sats in mid 66s. She was also noted to be hypotensive refractory to 1 L IVF and was started on levophed, which was eventually weaned off. Imaging of the chest concerning for segmental PE and bilateral pleural effusions. PCCM asked to admit.   Past Medical History  has a past medical history of Arthritis, Atrial fibrillation ([place]), Back pain, Cancer ([place]), Complication of anesthesia, Coronary artery disease, CVA (cerebral vascular accident) ([place]), Depression, Disc disorder, Dyspnea, Dysrhythmia, Fatigue, Giant cell arteritis ([place]), H/O: hysterectomy, Heart murmur, HTN (hypertension), Hypercholesteremia, PONV (postoperative nausea and vomiting), Subclavian artery  stenosis, right ([place]), and TIA (transient ischemic attack).  Significant Hospital Events   4/1 admitted for hypoxia, PE  4/2 intubated due to worsening respiratory status   Consults:  Cardiology   Procedures:  4/2: ETT placed, CVC placed   Significant Diagnostic Tests:  CTA chest (4/1) > acute PE with segmental and subsegmental emboli in both lower lobes and right upper lobe. Cardiomegaly with biatrial enlargement. Contrast refluxes into the hepatic veins and IVC. Aortic atherosclerosis and fusiform aneurysmal dilatation of the ascending aorta, maximal dimension 4.3 cm. Moderate bilateral pleural effusions with adjacent compressive atelectasis.   RUQ Ultrasound (4/2) --> Increased echogenicity consistent with fatty infiltrates vs hepatocellular disease. No focal abnormality. PV patent. Mild ascites.   Micro Data:  COVID 4/1 Blood 4/1 >  Antimicrobials:  Cefepime (ED) Vancomycin (ED)  Interim history/subjective:   Patient developed worsening hypoxia overnight with increased work of breathing. She was subsequently intubated. Additionally, she was started on Dobutamine and Lovephed for hypotension. Blood gases show a worsening acidosis consistent with increasing LA.   Urine output in the past 24h is 130 mL.   Objective   Blood pressure (!) 149/78, pulse 92, temperature (!) 96.5 F (35.8 C), temperature source Axillary, resp. rate (!) 24, height 5' (1.524 m), weight 47.2 kg, SpO2 96 %.    Vent Mode: PRVC FiO2 (%):  [40 %-100 %] 50 % Set Rate:  [16 bmp-24 bmp] 24 bmp Vt Set:  [400 mL] 400 mL PEEP:  [5 cmH20] 5 cmH20   Intake/Output Summary (Last 24 hours) at 2019-06-30 0708 Last data filed at 2019-06-30 0700 Gross per 24 hour  Intake 355.79  ml  Output 130 ml  Net 225.79 ml   Filed Weights   06/25/2019 2230 2019-06-14 0500  Weight: 47.2 kg 47.2 kg   Examination: General: Chronically ill appearing female, sedated.  HENT: ETT tube in place.  Lungs: Clear to auscultation  bilaterally. No rales or wheezes.  Cardiovascular: Tachycardic with irregularly irregular rate. No murmurs or gallops. Abdomen: Soft, non-distended with active bowel sounds. Extremities: Slightly cool to touch, no cyanosis. No edema.  Neuro: Sedated, Does not wake to voice.   Resolved Hospital Problem list   None  Assessment & Plan:   Cardiogenic shock, in the setting of PE Elevated Troponin  - Right sided heart pressures elevated on CTA on admission. Hypotensive at the time. - Dobutamine and Levophed started early on 4/2. On AM examination, Dobutamine @ 2.5, Levophed @ 14. Initially adequate, however requirement increased titration this afternoon.  - Elevated lactic acid at 9.2 on 4/1, continuing to climb on 4/2 at >11.0. - ABG showed pH of 7.4 --> 7.1 this AM. On repeat, pH had not improved, so bicarb drip initiated.  - TTE this AM shows septal dyssynergy with reduced Ef at 45-50%, compared to TTE obtained 2 weeks ago, at which time there was no wall motion abnormalities. Question if there was an ischemic event.  - Troponin has steadily elevated since yesterday (5271 --> 6469 --> 7900) - Cardiology consulted; appreciate their recommendations  - Continue to trend troponin   Acute hypoxic respiratory failure  Pulmonary embolism  - History of A. Fib with recent discontinuation of Warfarin in light of numerous falls  - CTA showed segmental and subsegmental PE in bilateral lobes  - Heparin gtt initiated on 4/1 - Intubated early on 4/2 due to worsening hypoxia, increased work of breathing and concern regarding ability to protect airway  - Discussed case with Dr. Annamaria Boots, IR, who personally reviewed CTA. No recommendations for targeted lysis at this time, as clot burden is low.   Acute Liver Injury secondary to shock - LFTs elevated and continue to increase this AM (AST 679, ALT 434). INR elevated at 1.8.  - Will monitor INR/PT and CMP closely  Acute Kidney Injury Oliguria  Hyperkalemia   - Likely in the setting of shock and hypovolemia - Poor urine output in the past 24 hrs concerning for worsening renal failure  - IVF @ 125 cc/hr maintenance - Lokelma given for elevated potassium of 6.3. Improved to 5.4. - Creatinine increased from 1.54 --> 1.89. BUN stable.   Atrial Fibrillation  - Previously stopped AC due to numerous falls  - Heparin gtt started on 4/1  - Continue Amiodarone   Bilateral pleural effusions - Secondary to volume overload vs pneumonia.  BNP elevated at 1467. Received Ceftriaxone + Vancomycin in the ED. Procal is elevated at 3.91. Given clinical picture, pneumonia is lower on differential; nonetheless, aspirate culture pending  - Discontinue vancomycin  - Co-ox panel pending  Inflammatory Bowel Disease - Continue home Budesonide   T2DM - SSI  Best practice:  Diet: NPO Pain/Anxiety/Delirium protocol (if indicated): Fentanyl  VAP protocol (if indicated): Ordered DVT prophylaxis: Heparin  GI prophylaxis: Protonix QD Glucose control: SSI, D50 + D5 for hypoglycemia  Mobility: Bedbound Code Status: FULL Code  Family Communication: Discussed with family at bedside on 4/2 Disposition: TBD  Labs   CBC: Recent Labs  Lab 06/12/2019 1516 07/01/2019 1516 06/23/2019 1538 06/21/2019 1539 06-14-19 0104 06/14/19 0239 June 14, 2019 0413  WBC 17.7*  --   --   --   --  14.8*  --   NEUTROABS 15.8*  --   --   --   --   --   --   HGB 12.6   < > 13.9 14.3 13.9 13.2 11.6*  HCT 41.0   < > 41.0 42.0 41.0 44.1 34.0*  MCV 87.4  --   --   --   --  90.0  --   PLT 283  --   --   --   --  267  --    < > = values in this interval not displayed.    Basic Metabolic Panel: Recent Labs  Lab 06/24/2019 1516 06/29/2019 1538 06/21/2019 1539 19-Jun-2019 0000 Jun 19, 2019 0104 06/19/2019 0239 06-19-2019 0413  NA 132*   < > 130* 134* 133* 136 134*  K 5.8*   < > 5.5* 6.2* 5.6* 6.3* 5.4*  CL 98  --  102 100  --  103  --   CO2 15*  --   --  14*  --  11*  --   GLUCOSE 174*  --  163* 143*  --   111*  --   BUN 69*  --  61* 69*  --  67*  --   CREATININE 1.68*  --  1.40* 1.54*  --  1.89*  --   CALCIUM 8.3*  --   --  8.4*  --  8.6*  --   MG  --   --   --   --   --  2.0  --   PHOS  --   --   --   --   --  6.6*  --    < > = values in this interval not displayed.   GFR: Estimated Creatinine Clearance: 14.8 mL/min (A) (by C-G formula based on SCr of 1.89 mg/dL (H)). Recent Labs  Lab 07/05/2019 1516 06/27/2019 2246 06/19/19 0000 06-19-2019 0239 06/19/19 0244  PROCALCITON  --   --   --  3.91  --   WBC 17.7*  --   --  14.8*  --   LATICACIDVEN  --  9.2* 9.0*  --  >11.0*    Liver Function Tests: Recent Labs  Lab 06/23/2019 1516 Jun 19, 2019 0239  AST 197* 679*  ALT 336* 434*  ALKPHOS 308* 306*  BILITOT 2.1* 2.1*  PROT 5.1* 5.0*  ALBUMIN 2.3* 2.3*   No results for input(s): LIPASE, AMYLASE in the last 168 hours. No results for input(s): AMMONIA in the last 168 hours.  ABG    Component Value Date/Time   PHART 7.153 (LL) 2019/06/19 0413   PCO2ART 40.1 06-19-19 0413   PO2ART 387.0 (H) 19-Jun-2019 0413   HCO3 14.3 (L) Jun 19, 2019 0413   TCO2 16 (L) 2019/06/19 0413   ACIDBASEDEF 14.0 (H) 06-19-2019 0413   O2SAT 100.0 June 19, 2019 0413     Coagulation Profile: Recent Labs  Lab 06-19-2019 0239  INR 1.8*    Cardiac Enzymes: No results for input(s): CKTOTAL, CKMB, CKMBINDEX, TROPONINI in the last 168 hours.  HbA1C: Hgb A1c MFr Bld  Date/Time Value Ref Range Status  06/22/2019 10:46 PM 7.5 (H) 4.8 - 5.6 % Final    Comment:    (NOTE) Pre diabetes:          5.7%-6.4% Diabetes:              >6.4% Glycemic control for   <7.0% adults with diabetes   06/17/2015 05:32 AM 7.6 (H) 4.8 - 5.6 % Final    Comment:    (  NOTE)         Pre-diabetes: 5.7 - 6.4         Diabetes: >6.4         Glycemic control for adults with diabetes: <7.0     CBG: Recent Labs  Lab 06/18/2019 2243 07/09/2019 0033 07-09-19 0421  GLUCAP 163* 139* 87    Review of Systems:   Unable to obtain  Past  Medical History  She,  has a past medical history of Arthritis, Atrial fibrillation ([place]), Back pain, Cancer ([place]), Complication of anesthesia, Coronary artery disease, CVA (cerebral vascular accident) ([place]), Depression, Disc disorder, Dyspnea, Dysrhythmia, Fatigue, Giant cell arteritis ([place]), H/O: hysterectomy, Heart murmur, HTN (hypertension), Hypercholesteremia, PONV (postoperative nausea and vomiting), Subclavian artery stenosis, right ([place]), and TIA (transient ischemic attack).   Surgical History    Past Surgical History:  Procedure Laterality Date  . ABDOMINAL HYSTERECTOMY    . APPENDECTOMY    . CARDIAC VALVE REPLACEMENT     08/28/16  . CARDIOVERSION N/A 08/18/2015   Procedure: CARDIOVERSION;  Surgeon: Dorothy Spark, MD;  Location: [facility];  Service: Cardiovascular;  Laterality: N/A;  . CATARACT EXTRACTION W/PHACO Left 03/10/2017   Procedure: CATARACT EXTRACTION PHACO AND INTRAOCULAR LENS PLACEMENT ([place]);  Surgeon: Leandrew Koyanagi, MD;  Location: [facility];  Service: Ophthalmology;  Laterality: Left;  US01:16.5 AP%21.1 [id number] FLUID LOT # [id number]  . CATARACT EXTRACTION W/PHACO Right 04/16/2017   Procedure: CATARACT EXTRACTION PHACO AND INTRAOCULAR LENS PLACEMENT ([facility]);  Surgeon: Leandrew Koyanagi, MD;  Location: [facility];  Service: Ophthalmology;  Laterality: Right;  [place] 01:13 AP% 20.3 CDE 14.93 Fluid pack lot # [id number]  . CORONARY ANGIOPLASTY     STENT 08/21/16  . KNEE SURGERY  3/06   Left  . MOHS SURGERY    . OPEN REDUCTION INTERNAL FIXATION (ORIF) DISTAL RADIAL FRACTURE Left 05/28/2019   Procedure: OPEN REDUCTION INTERNAL FIXATION (ORIF) DISTAL RADIAL FRACTURE;  Surgeon: Dayna Barker, MD;  Location: [place];  Service: Orthopedics;  Laterality: Left;  . PROLAPSED UTERINE FIBROID LIGATION  7/05     Social History   reports that she has never smoked. She has never used smokeless tobacco. She reports that she does not drink alcohol or use drugs.   Family History    Her family history includes Hypertension in her daughter and another family member. There is no history of Stroke or Diabetes type II.   Allergies Allergies  Allergen Reactions  . Ciprofloxacin Other (See Comments)    tachycardia  . Escitalopram Nausea Only and Other (See Comments)    "lips puffed", "tight in chest", "nasuea", "dizzy"  . Ranitidine Diarrhea     Home Medications  Prior to Admission medications   Medication Sig Start Date End Date Taking? Authorizing Provider  acetaminophen (TYLENOL) 650 MG CR tablet Take 1,300 mg by mouth in the morning and at bedtime.    [provider]  amiodarone (PACERONE) 200 MG tablet TAKE 1/2 TABLET BY MOUTH DAILY Patient taking differently: Take 200 mg by mouth daily.  08/26/17   Sherran Needs, NP  aspirin 81 MG tablet Take 81 mg by mouth daily.    [provider]  atorvastatin (LIPITOR) 10 MG tablet Take 10 mg by mouth See admin instructions. Every other night    [provider]  azelastine (ASTELIN) 0.1 % nasal spray Place 1-2 sprays into both nostrils 2 (two) times daily as needed for allergies. 05/06/19   [provider]  budesonide (ENTOCORT EC) 3 MG 24  hr capsule Take 6 mg by mouth daily. 03/17/19   [provider]  DULoxetine (CYMBALTA) 30 MG capsule Take 30 mg by mouth daily.    [provider]  KLOR-CON M20 20 MEQ tablet Take 20 mEq by mouth daily. 05/19/19   [provider]  lisinopril (ZESTRIL) 5 MG tablet Take 5 mg by mouth daily.    [provider]  methocarbamol (ROBAXIN) 500 MG tablet Take 2 tablets (1,000 mg total) by mouth every 8 (eight) hours as needed for muscle spasms. 06/01/19   Saverio Danker, PA-C  mirtazapine (REMERON) 15 MG tablet Take 7.5 mg by mouth at bedtime.    [provider]  Probiotic Product (ALIGN) 4 MG CAPS Take 4 mg by mouth daily.    [provider]  sodium chloride 1 g tablet Take 2 tablets (2 g total) by mouth 3 (three)  times daily with meals. 06/01/19   Saverio Danker, PA-C  traMADol (ULTRAM) 50 MG tablet Take 1 tablet (50 mg total) by mouth every 6 (six) hours as needed for moderate pain or severe pain (50mg  for moderate pain, 100mg  for severe pain). 06/01/19   Saverio Danker, PA-C  TRESIBA FLEXTOUCH 100 UNIT/ML SOPN FlexTouch Pen Inject 7 Units into the skin daily.  03/18/17   [provider]  Vitamin D, Ergocalciferol, (DRISDOL) 1.25 MG (50000 UNIT) CAPS capsule Take 50,000 Units by mouth once a week. 05/06/19   [provider]         Dr. Jose Persia Internal Medicine PGY-1  Pager: [phone] 2019-07-10, 1:20 PM

## 2019-07-10 NOTE — Progress Notes (Signed)
ETT pulled back by 2 as per md order and CXR.  Currently taped at 22 at the lip.  Art line attempted x2 unsuccessfully on the R (left wrist is wrapped), patient tolerated well, no adverse effects.  RN at bedside, MD notified.

## 2019-07-10 NOTE — Procedures (Signed)
Intubation Procedure Note [name] [name] [id number] 1929/09/06  Procedure: Intubation Indications: Respiratory insufficiency  Procedure Details Consent: Unable to obtain consent because of altered level of consciousness. Time Out: Verified patient identification, verified procedure, site/side was marked, verified correct patient position, special equipment/implants available, medications/allergies/relevent history reviewed, required imaging and test results available.  Performed  Maximum sterile technique was used including cap, gloves, hand hygiene and mask.  MAC and 3 ETT size 8  RSI: Fentanyl 100 mg Rocuronium 171m  Evaluation Hemodynamic Status: Transient hypotension treated with pressors and fluid; O2 sats: stable throughout Patient's Current Condition: stable Complications: No apparent complications Patient did tolerate procedure well. Chest X-ray ordered to verify placement.  CXR: tube position low-repostitioned.   [name] [name] 404/30/2021

## 2019-07-10 NOTE — Death Summary Note (Signed)
  Name: [name] [name] MRN: [id number] DOB: 10/06/1929 86 y.o.  Date of Admission: 06/13/2019  2:40 PM Date of Discharge: 06/20/2019 Attending Physician: Dr. Lamonte Sakai  Discharge Diagnosis: Active Problems:   Acute hypoxemic respiratory failure ([place])   Encounter for intubation   Encounter for central line placement  Cause of death: Cardiogenic Shock Time of death: 06-19-19 16:02  Disposition and follow-up:   [name] was discharged from [facility] in expired condition.    Hospital Course: 86 year old female with a PMHx of A fib, recently discontinued warfarin due to recurrent falls, CAD, CVA, giant cell arteritis, and subclavian artery stenosis who presents to the ED with c/o SOB and was subsequently found to have bilateral segmental pulmonary embolisms. Recent prior admission for a ground level fall that resulted in multiple fractures, including left radius that was repaired with ORIF. In addition at that time, she was treated for ESBL UTI.   On 05/24/19 she was found to be in extremis related to respiratory distress. Pulse ox found to be in 70s on room air. She was started on oxygen and transported to [facility] ED. She was initially requiring NRB, but was able to be weaned to 6L and remained with sats in mid 47s. She was also noted to be hypotensive refractory to 1 L IVF and was started on levophed, which was eventually weaned off. Imaging of the chest concerning for segmental PE and bilateral pleural effusions. PCCM asked to admit.Due to PE, patient developed cardiogenic shock with right heart strain. Patient was not a candidate for targeted or systemic lysis. Shock was refractory to pressor support.   Additionally, TTE showed septal dyssynergy with reduced EF and new wall motion abnormalities concerning for acute ischemic event. Cardiology was consulted. Invasive intervention was not recommended.   Secondary to cardiogenic shock, patient developed acute liver injury and  acute kidney injury with oliguria.   After goals of care discussion with family, it was decided to transition [name] to comfort care.   Signed: Dr. Jose Persia Internal Medicine PGY-1  Pager: [phone] 06/24/2019, 7:59 AM

## 2019-07-10 NOTE — Significant Event (Signed)
PCCM OVERNIGHT    Called to bedside by Elink to evaluate due to decompensation  86 yr old F w/ PMHx Pulmonary HTN (PVR 4.5 WU in 2018) s/p TAVR admitted on 04/01 w/ EF 55-60% at her baseline presented from SNF with dyspnea and was found to have segmental PE with bilateral pleural effusions.   Pt requiring 100% NRB initially was was weaned to 6 L.  Pt was also initially hypotensive on admission received 1 L IVF and was started on Levophed gtt.   At time of my evaluation pt noted to have increased work of breathing, hypoxic, with decreased mental status.  Hypotensive despite vasopressors. BNP 1467 with elevated Troponin (5271-->6469)  CT imaging does not comment on RV strain or RV:LV ratio  And clot burden is described as: Small segmental filling defects in the left lower lobe pulmonary artery. Segmental filling defect in the right lower lobe with additional subsegmental filling defects  Concern given pt now with segmental PE + elevated BNP + elevated transaminases and hypotensive--> increasing concern for worsening cardiac function.    Decision made to endotracheally intubate, then place CVC for vasopressors.  Pt was evaluated by Cardiology earlier.  Continues on heparin gtt-> paused for CVC insertion Pt appears preload dependent on exam.  BP improved with trendelenburg during CVC placement. Given a fluid bolus, and 1 push of bicarb. Will f/u ABG and labs   .Signed Dr Seward Carol [facility] Locums

## 2019-07-10 NOTE — Consult Note (Signed)
[facility] Consult Note   Primary Physician:   Primary Cardiologist:   Jenkins Rouge, MD  Reason for Consultation:  Shortness of breath  HPI:    [name] [name] is an 86 year old female with a past medical history significant for atrial flutter/fibrillation (not on anticoagulation due to high bleeding risk), AS s/p TAVR 2018 at [facility], CVA, peripheral vascular disease, right subclavian stenosis, type 2 diabetes mellitus, hypertension, hyperlipidemia, unsteady gait and recent mechanical fall with radial fracture s/p ORIF (discharged from the hospital 8 days ago) who is now readmitted to the hospital after developing shortness of breath after she went to physical therapy yesterday.  Apparently her O2 saturation was in the 70s.  In the ED her chest x-ray showed bilateral pleural effusions with interstitial edema.  White count was elevated at 17.7. A CT angiogram found segmental PEs.  There was reflux of contrast into the hepatic veins and IVC suggestive of elevated right-sided pressures.  The ECG revealed atrial flutter/atrial fibrillation with intraventricular conduction delay and Q waves in the anterior and inferior leads.  High-sensitivity troponins were 5371 and 6469.  Her lactic acid level was elevated at 9.2.   Previous cardiac testing Echo 05/26/19: 1. Left ventricular ejection fraction, by estimation, is 55 to 60%. The  left ventricle has normal function. The left ventricle has no regional  wall motion abnormalities. There is moderate left ventricular hypertrophy.  Left ventricular diastolic parameters are indeterminate.  2. Right ventricular systolic function is mildly reduced. The right  ventricular size is normal. There is mildly elevated pulmonary artery  systolic pressure. The estimated right ventricular systolic pressure is  [id number] mmHg.  3. Left atrial size was severely dilated.  4. The mitral valve is abnormal. Severe leaflet thickening. Severe mitral  annular  calfications. Moderate mitral valve regurgitation.  5. Tricuspid valve regurgitation is moderate.  6. There is a 23 mm Edwards Sapien prosthetic, stented (TAVR) valve  present in the aortic position. Aortic valve regurgitation is mild. Echo  findings are consistent with normal structure and function of the aortic  valve prosthesis. Mean gradient 98mmHg.  7. Aortic dilatation noted. There is dilatation of the ascending aorta  measuring 40 mm.  8. The inferior vena cava is normal in size with <50% respiratory  variability, suggesting right atrial pressure of 8 mmHg.   Cardiac catheterization 08/21/2016 Findings: 1. No significant coronary artery disease. 2. Normal left and right ventricular filling pressures with relatively  elevated PVR (4.5 Wood units)   Home Medications Prior to Admission medications   Medication Sig Start Date End Date Taking? Authorizing Provider  acetaminophen (TYLENOL) 650 MG CR tablet Take 1,300 mg by mouth in the morning and at bedtime.    [provider]  amiodarone (PACERONE) 200 MG tablet TAKE 1/2 TABLET BY MOUTH DAILY Patient taking differently: Take 200 mg by mouth daily.  08/26/17   Sherran Needs, NP  aspirin 81 MG tablet Take 81 mg by mouth daily.    [provider]  atorvastatin (LIPITOR) 10 MG tablet Take 10 mg by mouth See admin instructions. Every other night    [provider]  azelastine (ASTELIN) 0.1 % nasal spray Place 1-2 sprays into both nostrils 2 (two) times daily as needed for allergies. 05/06/19   [provider]  budesonide (ENTOCORT EC) 3 MG 24 hr capsule Take 6 mg by mouth daily. 03/17/19   [provider]  DULoxetine (CYMBALTA) 30 MG capsule Take 30 mg by mouth daily.  [provider]  KLOR-CON M20 20 MEQ tablet Take 20 mEq by mouth daily. 05/19/19   [provider]  lisinopril (ZESTRIL) 5 MG tablet Take 5 mg by mouth daily.    [provider]  methocarbamol  (ROBAXIN) 500 MG tablet Take 2 tablets (1,000 mg total) by mouth every 8 (eight) hours as needed for muscle spasms. 06/01/19   Saverio Danker, PA-C  mirtazapine (REMERON) 15 MG tablet Take 7.5 mg by mouth at bedtime.    [provider]  Probiotic Product (ALIGN) 4 MG CAPS Take 4 mg by mouth daily.    [provider]  sodium chloride 1 g tablet Take 2 tablets (2 g total) by mouth 3 (three) times daily with meals. 06/01/19   Saverio Danker, PA-C  traMADol (ULTRAM) 50 MG tablet Take 1 tablet (50 mg total) by mouth every 6 (six) hours as needed for moderate pain or severe pain (50mg  for moderate pain, 100mg  for severe pain). 06/01/19   Saverio Danker, PA-C  TRESIBA FLEXTOUCH 100 UNIT/ML SOPN FlexTouch Pen Inject 7 Units into the skin daily.  03/18/17   [provider]  Vitamin D, Ergocalciferol, (DRISDOL) 1.25 MG (50000 UNIT) CAPS capsule Take 50,000 Units by mouth once a week. 05/06/19   [provider]    Past Medical History: Past Medical History:  Diagnosis Date  . Arthritis   . Atrial fibrillation (HCC)    persistent, coumadin per PCP;  echo 06/06/09: EF 60%, mild LVH, Gr 1 diast dysfx, AV gradient mean 9, mod MR, mod LAE;  Myoview 11/09: normal, EF 81%  . Back pain   . Cancer ([place])   . Complication of anesthesia   . Coronary artery disease   . CVA (cerebral vascular accident) ([place])    2006  . Depression   . Disc disorder    STEROID INJECTION 12/09/16  . Dyspnea    DOE  . Dysrhythmia   . Fatigue   . Giant cell arteritis [facility])    sees dr. at [facility]  . H/O: hysterectomy   . Heart murmur   . HTN (hypertension)   . Hypercholesteremia    Secondary to vascular disease  . PONV (postoperative nausea and vomiting)    DID FINE WITH CATARACT SURGERY  . Subclavian artery stenosis, right (HCC)    dopplers done 2/12;  carotids 2/12: 0-39% bilateral  . TIA (transient ischemic attack)     Past Surgical History: Past Surgical History:  Procedure Laterality Date   . ABDOMINAL HYSTERECTOMY    . APPENDECTOMY    . CARDIAC VALVE REPLACEMENT     08/28/16  . CARDIOVERSION N/A 08/18/2015   Procedure: CARDIOVERSION;  Surgeon: Dorothy Spark, MD;  Location: [place];  Service: Cardiovascular;  Laterality: N/A;  . CATARACT EXTRACTION W/PHACO Left 03/10/2017   Procedure: CATARACT EXTRACTION PHACO AND INTRAOCULAR LENS PLACEMENT ([place]);  Surgeon: Leandrew Koyanagi, MD;  Location: [facility];  Service: Ophthalmology;  Laterality: Left;  US01:16.5 AP%21.1 [id number] FLUID LOT # [id number]  . CATARACT EXTRACTION W/PHACO Right 04/16/2017   Procedure: CATARACT EXTRACTION PHACO AND INTRAOCULAR LENS PLACEMENT ([facility]);  Surgeon: Leandrew Koyanagi, MD;  Location: [facility];  Service: Ophthalmology;  Laterality: Right;  [place] 01:13 AP% 20.3 CDE 14.93 Fluid pack lot # [id number]  . CORONARY ANGIOPLASTY     STENT 08/21/16  . KNEE SURGERY  3/06   Left  . MOHS SURGERY    . OPEN REDUCTION INTERNAL FIXATION (ORIF) DISTAL RADIAL FRACTURE Left 05/28/2019   Procedure: OPEN  REDUCTION INTERNAL FIXATION (ORIF) DISTAL RADIAL FRACTURE;  Surgeon: Dayna Barker, MD;  Location: [place];  Service: Orthopedics;  Laterality: Left;  . PROLAPSED UTERINE FIBROID LIGATION  7/05    Family History: Family History  Problem Relation Age of Onset  . Hypertension Daughter   . Hypertension Other   . Stroke Neg Hx   . Diabetes type II Neg Hx     Social History: Social History   Socioeconomic History  . Marital status: Widowed    Spouse name: Not on file  . Number of children: Not on file  . Years of education: Not on file  . Highest education level: Not on file  Occupational History  . Not on file  Tobacco Use  . Smoking status: Never Smoker  . Smokeless tobacco: Never Used  Substance and Sexual Activity  . Alcohol use: No  . Drug use: No  . Sexual activity: Not on file  Other Topics Concern  . Not on file  Social History Narrative   She is happily married. Her husband was with her  today. She lives between [place] and [place].  Retired Advertising account planner.   Social Determinants of Health   Financial Resource Strain:   . Difficulty of Paying Living Expenses:   Food Insecurity:   . Worried About Charity fundraiser in the Last Year:   . Arboriculturist in the Last Year:   Transportation Needs:   . Film/video editor (Medical):   Marland Kitchen Lack of Transportation (Non-Medical):   Physical Activity:   . Days of Exercise per Week:   . Minutes of Exercise per Session:   Stress:   . Feeling of Stress :   Social Connections:   . Frequency of Communication with Friends and Family:   . Frequency of Social Gatherings with Friends and Family:   . Attends Religious Services:   . Active Member of Clubs or Organizations:   . Attends Archivist Meetings:   Marland Kitchen Marital Status:     Allergies:  Allergies  Allergen Reactions  . Ciprofloxacin Other (See Comments)    tachycardia  . Escitalopram Nausea Only and Other (See Comments)    "lips puffed", "tight in chest", "nasuea", "dizzy"  . Ranitidine Diarrhea     Review of Systems: [y] = yes, [ ]  = no   . General: Weight gain [ ] ; Weight loss [ ] ; Anorexia [ ] ; Fatigue [ ] ; Fever [ ] ; Chills [ ] ; Weakness [Y]  . Cardiac: Chest pain/pressure [Y]; Resting SOB [Y]; Exertional SOB [ ] ; Orthopnea [ ] ; Pedal Edema [ ] ; Palpitations [ ] ; Syncope [ ] ; Presyncope [ ] ; Paroxysmal nocturnal dyspnea[ ]   . Pulmonary: Cough [ ] ; Wheezing[ ] ; Hemoptysis[ ] ; Sputum [ ] ; Snoring [ ]   . GI: Vomiting[ ] ; Dysphagia[ ] ; Melena[ ] ; Hematochezia [ ] ; Heartburn[ ] ; Abdominal pain [ ] ; Constipation [ ] ; Diarrhea [ ] ; BRBPR [ ]   . GU: Hematuria[ ] ; Dysuria [ ] ; Nocturia[ ]   . Vascular: Pain in legs with walking [ ] ; Pain in feet with lying flat [ ] ; Non-healing sores [ ] ; Stroke [ ] ; TIA [ ] ; Slurred speech [ ] ;  . Neuro: Headaches[ ] ; Vertigo[ ] ; Seizures[ ] ; Paresthesias[ ] ;Blurred vision [ ] ; Diplopia [ ] ; Vision changes [ ]   . Ortho/Skin:  Arthritis [ ] ; Joint pain [ ] ; Muscle pain [ ] ; Joint swelling [ ] ; Back Pain [ ] ; Rash [ ]   . Psych: Depression[ ] ; Anxiety[ ]   .  Heme: Bleeding problems [ ] ; Clotting disorders [ ] ; Anemia [ ]   . Endocrine: Diabetes [ ] ; Thyroid dysfunction[ ]      Objective:    Vital Signs:   Temp:  [95.8 F (35.4 C)-96.5 F (35.8 C)] 96.5 F (35.8 C) (04/02 0024) Pulse Rate:  [80-105] 80 (04/02 0130) Resp:  [16-40] 28 (04/02 0130) BP: (61-167)/(44-145) 139/94 (04/02 0130) SpO2:  [77 %-100 %] 100 % (04/02 0130) Weight:  [47.2 kg] 47.2 kg (04/01 2230) Last BM Date: (PTA)  Weight change: Filed Weights   06/26/2019 2230  Weight: 47.2 kg    Intake/Output:   Intake/Output Summary (Last 24 hours) at 2019-07-10 0204 Last data filed at 07-10-2019 0100 Gross per 24 hour  Intake 181.27 ml  Output --  Net 181.27 ml      Physical Exam    General: Somnolent but easily arousable HEENT: normal Neck: Unable to assess JVD. Cor: Irregularly irregular.  No loud murmurs Lungs: Crackles at the bases Abdomen: soft, nontender, nondistended. No hepatosplenomegaly. No bruits or masses.  Extremities: no cyanosis, clubbing, rash, edema -  Neuro: alert & orientedx3,   Affect appropriate     Labs   Basic Metabolic Panel: Recent Labs  Lab 06/14/2019 1516 06/25/2019 1538 06/20/2019 1539 2019/07/10 0000 07/10/2019 0104  NA 132* 130* 130* 134* 133*  K 5.8* 5.3* 5.5* 6.2* 5.6*  CL 98  --  102 100  --   CO2 15*  --   --  14*  --   GLUCOSE 174*  --  163* 143*  --   BUN 69*  --  61* 69*  --   CREATININE 1.68*  --  1.40* 1.54*  --   CALCIUM 8.3*  --   --  8.4*  --     Liver Function Tests: Recent Labs  Lab 06/15/2019 1516  AST 197*  ALT 336*  ALKPHOS 308*  BILITOT 2.1*  PROT 5.1*  ALBUMIN 2.3*   No results for input(s): LIPASE, AMYLASE in the last 168 hours. No results for input(s): AMMONIA in the last 168 hours.  CBC: Recent Labs  Lab 06/25/2019 1516 06/27/2019 1538 06/23/2019 1539 Jul 10, 2019 0104   WBC 17.7*  --   --   --   NEUTROABS 15.8*  --   --   --   HGB 12.6 13.9 14.3 13.9  HCT 41.0 41.0 42.0 41.0  MCV 87.4  --   --   --   PLT 283  --   --   --     Cardiac Enzymes: No results for input(s): CKTOTAL, CKMB, CKMBINDEX, TROPONINI in the last 168 hours.  BNP: BNP (last 3 results) Recent Labs    07/01/2019 1516  BNP 1,467.3*    ProBNP (last 3 results) No results for input(s): PROBNP in the last 8760 hours.   CBG: Recent Labs  Lab 06/14/2019 2243 Jul 10, 2019 0033  GLUCAP 163* 139*    Coagulation Studies: No results for input(s): LABPROT, INR in the last 72 hours.   Imaging   DG Chest Port 1 View  Result Date: 07/07/2019 CLINICAL DATA:  Shortness of breath EXAM: PORTABLE CHEST 1 VIEW COMPARISON:  May 24, 2019 FINDINGS: There is cardiomegaly with mild pulmonary venous hypertension. There is interstitial edema with bilateral pleural effusions. There is airspace opacity in the left base. There is an aortic valve replacement. There is aortic atherosclerosis. No adenopathy. There is evidence of an old fracture of the proximal right humerus with remodeling. Bones are osteoporotic. IMPRESSION: Cardiomegaly  with pulmonary vascular congestion. Bilateral effusions with interstitial edema. Suspect alveolar edema left base, although superimposed pneumonia in this area cannot be excluded. The overall appearance is most indicative of congestive heart failure. There is an aortic valve replacement. Aortic Atherosclerosis (ICD10-I70.0). Old fracture proximal right humerus with remodeling.  Osteoporosis. Electronically Signed   By: Lowella Grip III M.D.   On: 07/07/2019 15:28   CT Angio Chest/Abd/Pel for Dissection W and/or W/WO  Result Date: 06/17/2019 CLINICAL DATA:  86 year old with shortness of breath. Chest pain. EXAM: CT ANGIOGRAPHY CHEST, ABDOMEN AND PELVIS TECHNIQUE: Multidetector CT imaging through the chest, abdomen and pelvis was performed using the standard protocol during  bolus administration of intravenous contrast. Multiplanar reconstructed images and MIPs were obtained and reviewed to evaluate the vascular anatomy. CONTRAST:  194mL OMNIPAQUE IOHEXOL 350 MG/ML SOLN COMPARISON:  Radiograph earlier today. Chest CT 2 weeks ago FINDINGS: CTA CHEST FINDINGS Cardiovascular: Positive for acute PE. Small segmental filling defects in the left lower lobe pulmonary artery, series 6, image 50. Segmental filling defect in the right lower lobe best appreciated on series 9, image 59, with additional subsegmental filling defects, series 6, image 49. Anterior right upper lobe segmental filling defect series 6 image 35. Aortic atherosclerosis. Fusiform aneurysmal dilatation of the ascending aorta maximal dimension 4.2 cm. Prior aortic valve replacement. No aortic hematoma or dissection. Occasional irregular intraluminal low-density in the descending thoracic aorta is felt to be related to flow mixing artifact. Conventional branching pattern from the aortic arch, branch vessels are patent. Pulmonary arteries are well opacified. cardiomegaly with biatrial enlargement. Contrast refluxes into the hepatic veins and IVC. No pericardial effusion. There are dense mitral annulus calcifications. Mediastinum/Nodes: Calcified mediastinal lymph nodes. No definite noncalcified adenopathy. Patulous esophagus without wall thickening. Peripherally calcified 7 mm right thyroid nodule. Subcentimeter noncalcified left thyroid nodule. No dedicated imaging follow-up is needed unless clinically indicated. Lungs/Pleura: Moderate bilateral pleural effusions with adjacent compressive atelectasis. Heterogeneous ground-glass opacities in both lower lobes are nonspecific. Occasional septal thickening in the upper lobes, partially obscured by motion. Trachea and mainstem bronchi are patent. Musculoskeletal: T7 compression fracture is unchanged from prior. No new osseous abnormalities remote right posterior rib fracture.  Generalized paucity of body fat. Review of the MIP images confirms the above findings. CTA ABDOMEN AND PELVIS FINDINGS VASCULAR Aorta: Moderate aortic atherosclerosis. Normal in caliber without aneurysm. No aortic dissection. Celiac: Plaque at the origin causes mild luminal narrowing. The branch vessels are patent. SMA: Plaque at the origin causes mild luminal narrowing. Branch vessels are patent but small in caliber. Renals: Bilateral renal arteries are patent. No dissection. IMA: Diminutive but there is flow. Inflow: Common femoral arteries are small in caliber but patent. Veins: Not well assessed on this arterial phase exam. Review of the MIP images confirms the above findings. NON-VASCULAR Hepatobiliary: Heterogeneous enhancement of the left lobe adjacent to the falciform ligament is felt to be lead to altered perfusion. No focal hepatic lesion. No calcified gallstone or biliary dilatation. Pancreas: Parenchymal atrophy. No ductal dilatation or inflammation. Spleen: Normal in size without focal abnormality. Adrenals/Urinary Tract: No adrenal nodule. Excreted IV contrast within both renal collecting systems. No hydronephrosis. Again seen 2.9 cm cyst in the mid right kidney. Urinary bladder partially distended. Stomach/Bowel: Bowel evaluation is limited in the absence of enteric contrast and paucity of intra-abdominal fat. Nondistended stomach. No evidence of bowel obstruction or inflammation. High-density ingested material within the colon likely bismuth containing products. Appendix not visualized. Transverse colon is tortuous. Lymphatic: No  bulky abdominopelvic adenopathy. Reproductive: Status post hysterectomy. No adnexal masses. Other: Generalized body wall edema that is new. Minimal fluid in the pericolic gutters and edema of the intra-abdominal fat, also new. There is no free air. Musculoskeletal: Unchanged compression fractures of L1, L4, and L5 from exam 2 weeks ago. Minimal callus formation at S1-S2 may  represent healing fracture. Review of the MIP images confirms the above findings. IMPRESSION: 1. Positive for acute PE with segmental and subsegmental emboli in both lower lobes and right upper lobe. Thromboembolic burden is small. 2. Cardiomegaly with biatrial enlargement. Contrast refluxes into the hepatic veins and IVC consistent with elevated right heart pressures. 3. Aortic atherosclerosis and fusiform aneurysmal dilatation of the ascending aorta, maximal dimension 4.3 cm. No aortic dissection. Recommend annual imaging followup by CTA or MRA. This recommendation follows 2010 ACCF/AHA/AATS/ACR/ASA/SCA/SCAI/SIR/STS/SVM Guidelines for the Diagnosis and Management of Patients with Thoracic Aortic Disease. Circulation. 2010; [id number]. Aortic aneurysm NOS (ICD10-I71.9) 4. Moderate bilateral pleural effusions with adjacent compressive atelectasis. Heterogeneous ground-glass opacities in both lower lobes may be pulmonary edema, or atelectasis. 5. No acute abnormality in the abdomen/pelvis. Aortic Atherosclerosis (ICD10-I70.0). Aortic aneurysm NOS (ICD10-I71.9). Critical Value/emergent results were called by telephone at the time of interpretation on 07/09/2019 at 5:23 pm to Dr [facility] PFEIFFER , who verbally acknowledged these results. Electronically Signed   By: Keith Rake M.D.   On: 06/29/2019 17:24      Medications:     Current Medications: . amiodarone  100 mg Oral Daily  . aspirin EC  81 mg Oral Daily  . atorvastatin  80 mg Oral Daily  . budesonide  6 mg Oral Daily  . DULoxetine  30 mg Oral Daily  . furosemide  40 mg Intravenous Once  . insulin aspart  0-9 Units Subcutaneous TID WC  . sodium zirconium cyclosilicate  10 g Oral TID     Infusions: . sodium chloride     Followed by  . sodium chloride    . calcium gluconate    . DOBUTamine    . heparin 500 Units/hr (06/28/2019 1734)  . norepinephrine    . norepinephrine (LEVOPHED) Adult infusion    . [START ON 06/12/2019] vancomycin          Assessment/Plan   1. Elevated troponin Patient is brought to the hospital for low oxygen saturation/hypoxia and hypotension.  A CT angiogram found segmental PEs.  There was reflux of contrast into the hepatic veins and IVC suggestive of elevated right-sided pressures.  The ECG revealed atrial flutter/atrial fibrillation with intraventricular conduction delay and Q waves in the anterior and inferior leads.  High-sensitivity troponins are 5371 and 6469.  A cardiac catheterization procedure on 08/21/2016 did not reveal any significant coronary artery disease.  -Agree with unfractionated IV heparin -Aspirin 81 mg daily -Repeat echocardiogram to assess LV function  2. Persistent atrial fibrillation The patient is not on anticoagulation due to high risk for bleeding.  She has failed Tikosyn and flecainide in the past.  She is also s/p multiple failed DCCV.  Is on rate control with amiodarone.  CHA2DS2-VASc score is 7.  -Continue amiodarone for rate control -IV heparin in the short-term for PE (we will have to reassess the issue of chronic anticoagulation at some point)   Meade Maw, MD  06-23-19, 2:04 AM  Cardiology Overnight Team Please contact [facility] for night-coverage after hours (4p -7a ) and weekends on [email or web address]

## 2019-07-10 DEATH — deceased

## 2021-08-18 IMAGING — CT CT CHEST W/ CM
2 of 5 series · 12 of 36 positions shown, 15 images · IV contrast (APPLIED)
Comparison: Chest radiograph dated 05/24/2019 and CT dated
06/17/2015

CLINICAL DATA: 89-year-old female with abdominal trauma.

EXAM:
CT CHEST, ABDOMEN, AND PELVIS WITH CONTRAST
TECHNIQUE: Multidetector CT imaging of the chest, abdomen and pelvis was
performed following the standard protocol during bolus
administration of intravenous contrast.
CONTRAST:  100mL OMNIPAQUE IOHEXOL 300 MG/ML  SOLN

[Series 3: cap 5.0 i31f 2 · axial · 0.70mm/px · z∈[+982,+1442]mm · 9 of 116 slices shown, 12 images]
[im 12/116  mediastinal]
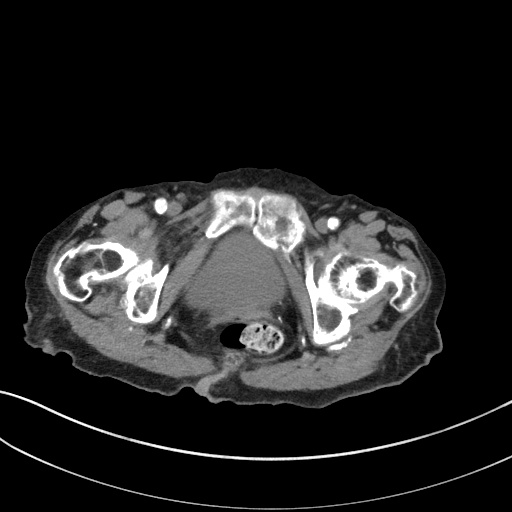
[im 12/116  lung]
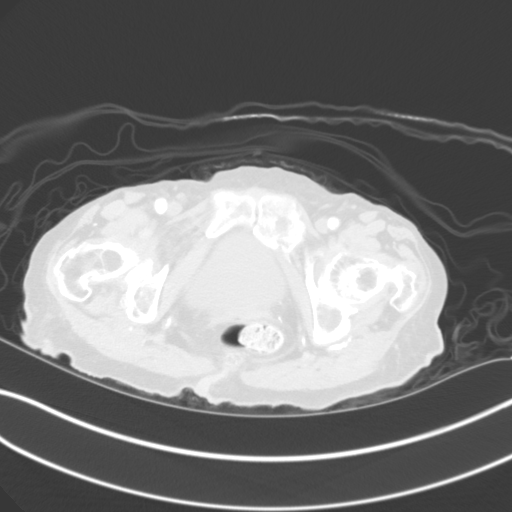
[im 24/116  lung]
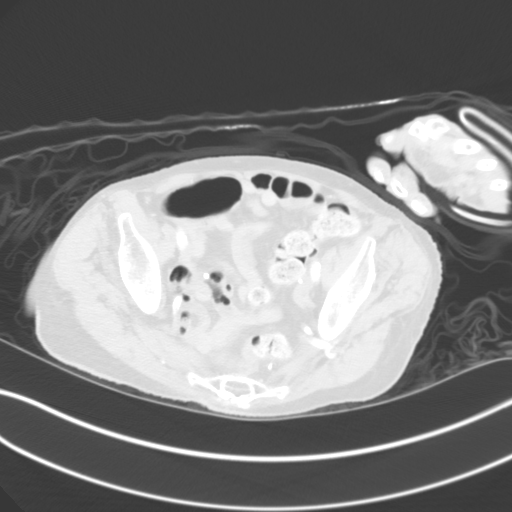
[im 35/116  lung]
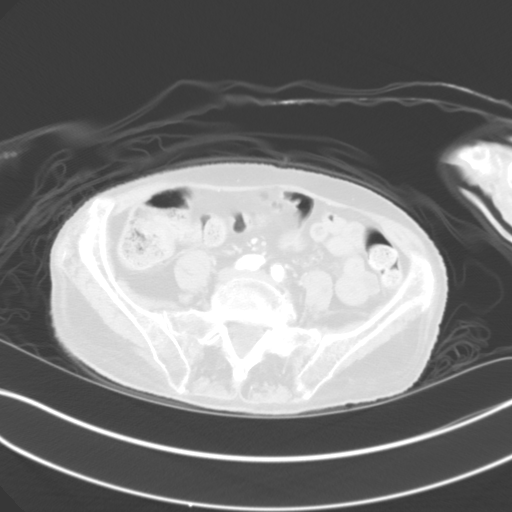
[im 47/116  lung]
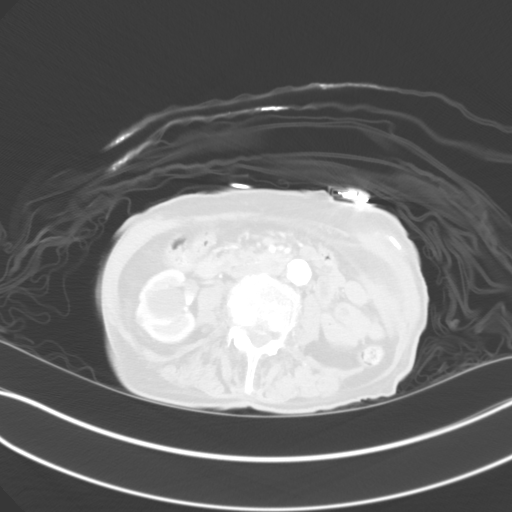
[im 58/116  mediastinal]
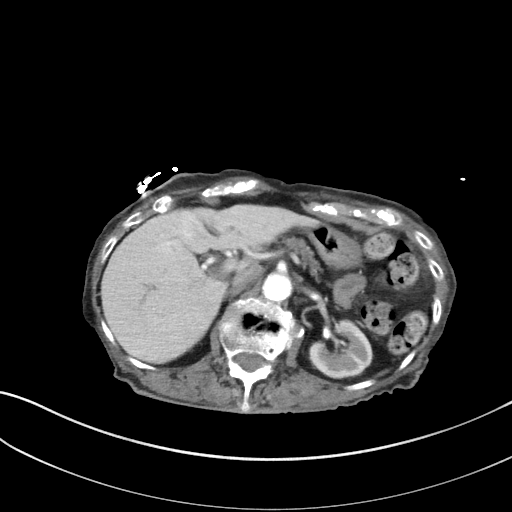
[im 58/116  lung]
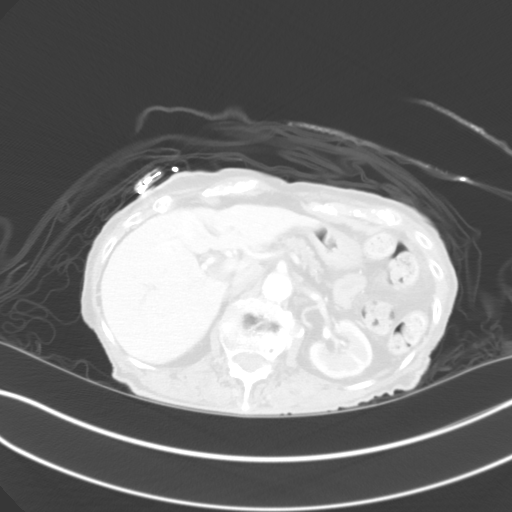
[im 70/116  lung]
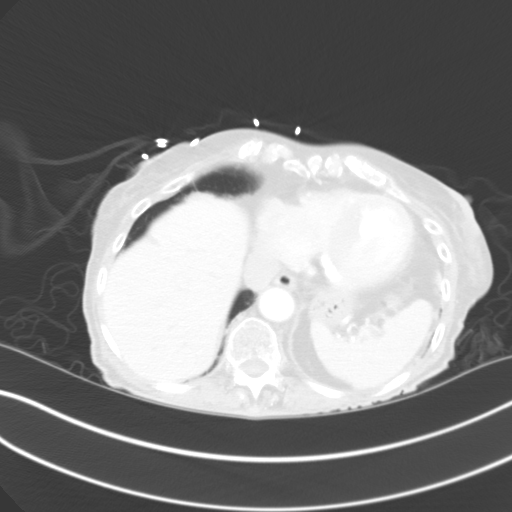
[im 81/116  lung]
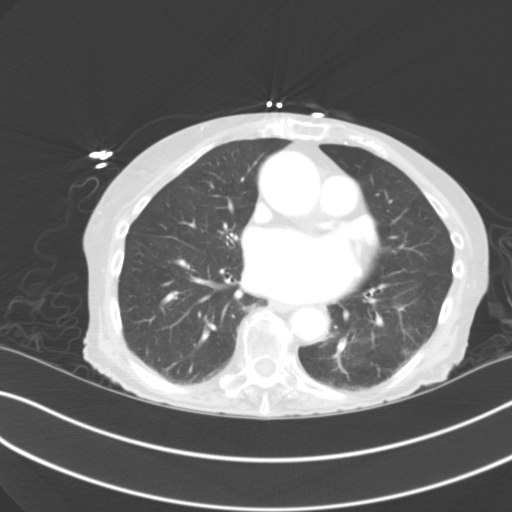
[im 93/116  lung]
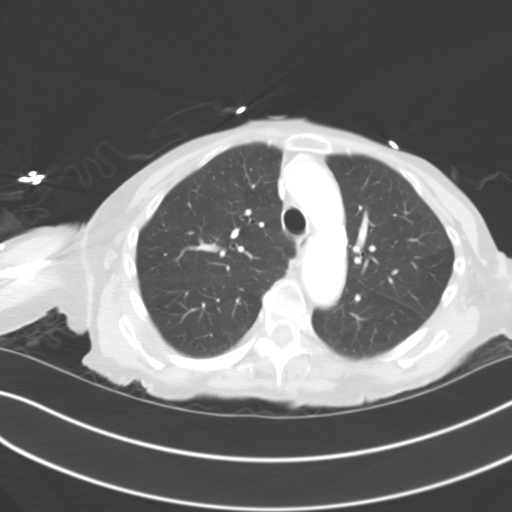
[im 104/116  mediastinal]
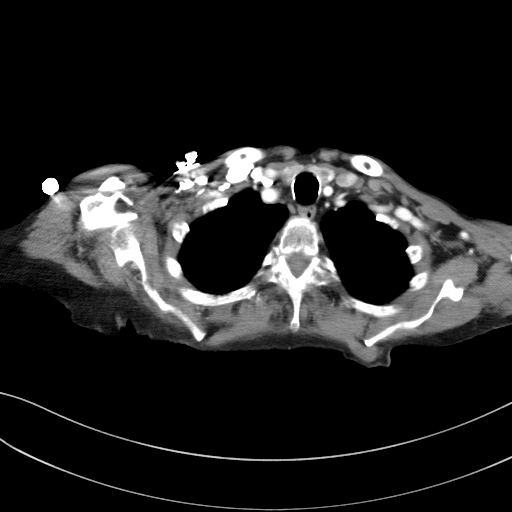
[im 104/116  lung]
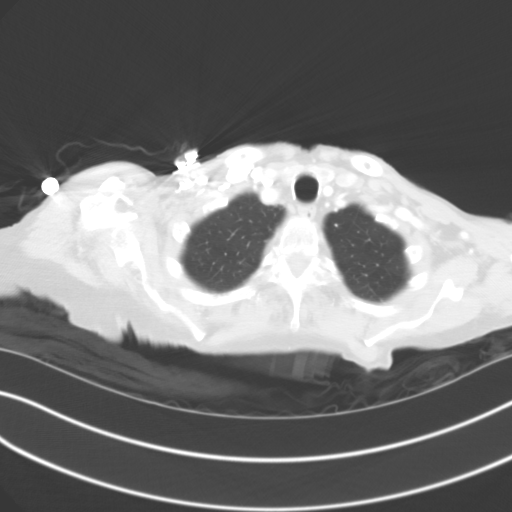

[Series 6: coronal · coronal · 0.68mm/px · 3 of 143 slices shown]
[im 29/143  lung]
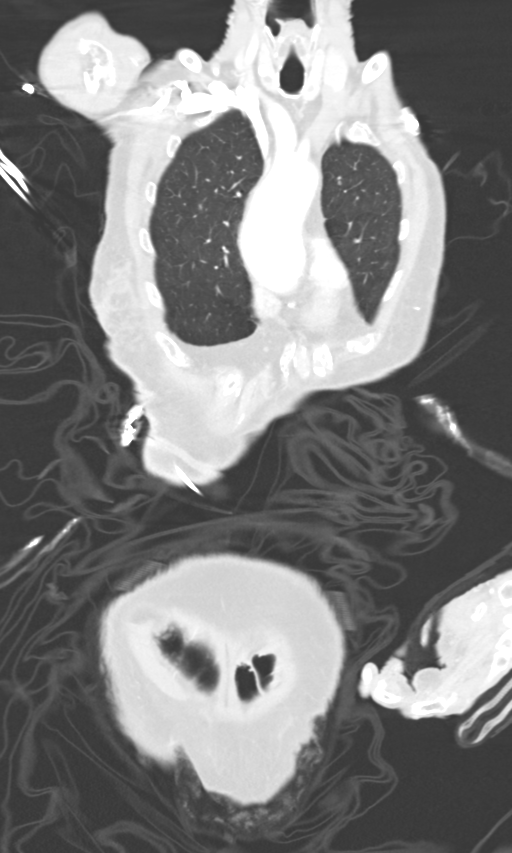
[im 57/143  lung]
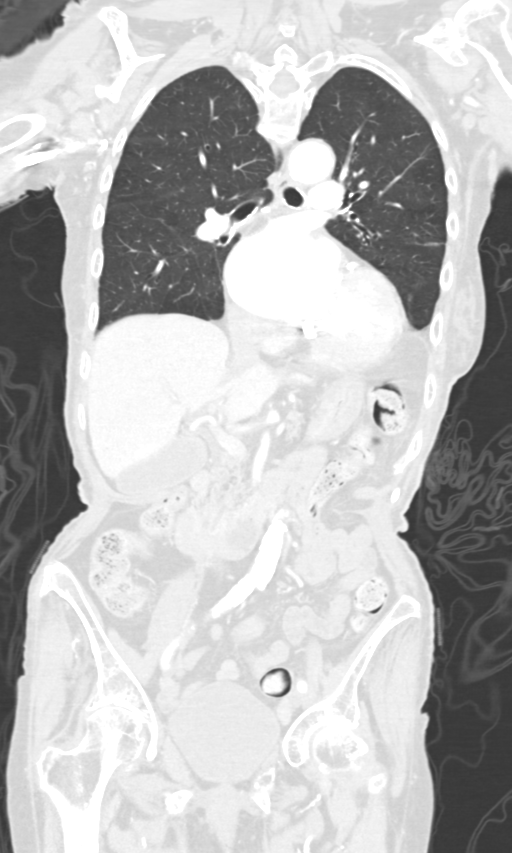
[im 86/143  lung]
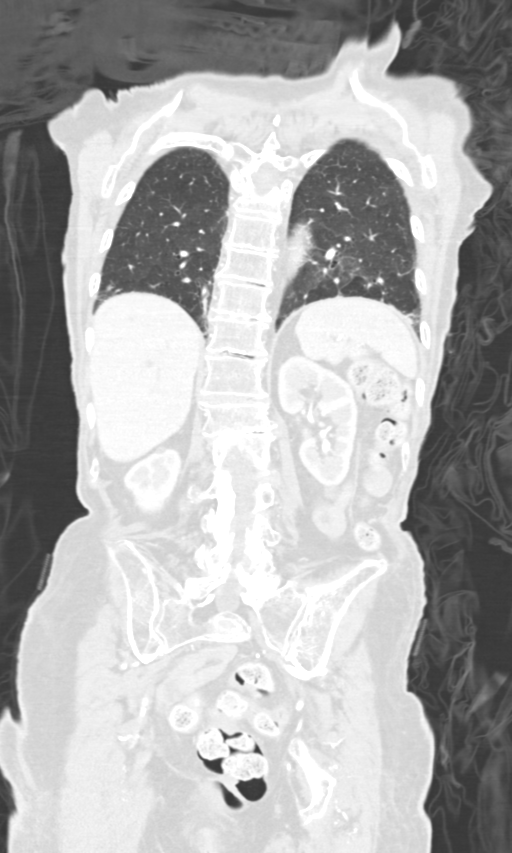

[12 of 36 positions shown; findings below may reference images not displayed]

FINDINGS: CT CHEST FINDINGS

Cardiovascular: There is mild dilatation of the left atrium.
Advanced calcification of the mitral annulus likely resulting in
mitral stenosis. There is no pericardial effusion. Aortic valve
repair. Top-normal caliber ascending aorta measuring 4 cm in axial
diameter. There is advanced atherosclerotic calcification of the
aorta. No dissection. The origins of the great vessels of the aortic
arch appear patent. The central pulmonary arteries appear
unremarkable.

Mediastinum/Nodes: There is no hilar or mediastinal adenopathy. The
esophagus is grossly unremarkable. Bilateral thyroid nodules noted.
No mediastinal fluid collection or hematoma.

Lungs/Pleura: There are bibasilar subpleural atelectasis/scarring.
No focal consolidation, pleural effusion, or pneumothorax. The
central airways are patent.

Musculoskeletal: There is advanced osteopenia and extensive
degenerative changes of the spine. T7 compression fracture with
approximately greater than 50% loss of vertebral body height and
anterior wedging, likely acute. Correlation with clinical exam and
point tenderness recommended. No retropulsed fragment. There is
focal sclerotic area with faint linear lucency in the superior
aspect of the sternal body at the articulation with the manubrium
which is also concerning for a nondisplaced acute fracture.
Old-appearing right eleventh and twelfth rib fractures. There is
chronic displaced fracture of the right humeral neck.

CT ABDOMEN PELVIS FINDINGS

No intra-abdominal free air or free fluid.

Hepatobiliary: Mild irregularity of the liver contour may represent
early changes of cirrhosis. No intrahepatic biliary ductal
dilatation. The gallbladder is unremarkable.

Pancreas: Unremarkable. No pancreatic ductal dilatation or
surrounding inflammatory changes.

Spleen: Normal in size without focal abnormality.

Adrenals/Urinary Tract: The adrenal glands are unremarkable. There
is a 3 cm right renal interpolar cyst. There is no hydronephrosis on
either side. There is symmetric enhancement and excretion of
contrast by both kidneys. The visualized ureters and urinary bladder
appear unremarkable.

Stomach/Bowel: There is large amount of stool throughout the colon.
There is no bowel obstruction or active inflammation. Appendectomy.

Vascular/Lymphatic: Advanced aortoiliac atherosclerotic disease. The
IVC is unremarkable. No portal venous gas. There is no adenopathy.

Reproductive: Hysterectomy. No adnexal masses.

Other: None

Musculoskeletal: Osteopenia with severe multilevel degenerative
changes and multilevel disc desiccation and vacuum phenomena. Old
healed left pubic bone as well as right inferior pubic ramus
fractures. Old L1 compression fracture with anterior wedging similar
to prior CT. There is new compression fractures of the superior
endplate of L5 and inferior endplate of L4. The L5 superior endplate
appears acute or subacute. Clinical correlation recommended. No
retropulsed fragment.
IMPRESSION: 1. Advanced osteopenia with multilevel compression fractures as
described. The T7, L4 and L5 compression fractures appear new since
the prior studies and concerning for acute or subacute fractures.
Correlation with point tenderness recommended. No retropulsed
fragment.
2. Age indeterminate, possibly acute nondisplaced fracture of the
superior is sternal body.
3. No other acute/traumatic intrathoracic, abdominal, or pelvic
pathology.
4. Constipation. No bowel obstruction.
5. Aortic Atherosclerosis ([id number]).
